# Patient Record
Sex: Female | Born: 1965 | Race: Black or African American | Hispanic: No | Marital: Single | Smoking: Never smoker
Health system: Southern US, Community
[De-identification: ages and names within clinical notes are randomized; demographics above are authoritative.]

## PROBLEM LIST (undated history)

## (undated) ENCOUNTER — Encounter

## (undated) ENCOUNTER — Encounter: Attending: Family | Primary: Family

## (undated) ENCOUNTER — Telehealth: Attending: Surgery | Primary: Surgery

## (undated) ENCOUNTER — Encounter: Attending: Hematology & Oncology | Primary: Hematology & Oncology

## (undated) ENCOUNTER — Encounter: Attending: Radiation Oncology | Primary: Radiation Oncology

## (undated) ENCOUNTER — Ambulatory Visit: Payer: PRIVATE HEALTH INSURANCE

## (undated) ENCOUNTER — Ambulatory Visit: Payer: PRIVATE HEALTH INSURANCE | Attending: Surgery | Primary: Surgery

## (undated) ENCOUNTER — Telehealth

## (undated) ENCOUNTER — Ambulatory Visit: Payer: PRIVATE HEALTH INSURANCE | Attending: Hematology & Oncology | Primary: Hematology & Oncology

## (undated) ENCOUNTER — Ambulatory Visit

## (undated) ENCOUNTER — Telehealth: Attending: Hematology & Oncology | Primary: Hematology & Oncology

## (undated) ENCOUNTER — Ambulatory Visit: Attending: Radiation Oncology | Primary: Radiation Oncology

## (undated) ENCOUNTER — Encounter: Attending: Surgery | Primary: Surgery

## (undated) ENCOUNTER — Ambulatory Visit: Attending: Family Medicine | Primary: Family Medicine

## (undated) ENCOUNTER — Encounter: Attending: Pharmacist | Primary: Pharmacist

## (undated) ENCOUNTER — Encounter: Attending: Adult Health | Primary: Adult Health

## (undated) ENCOUNTER — Ambulatory Visit: Payer: PRIVATE HEALTH INSURANCE | Attending: Family | Primary: Family

## (undated) ENCOUNTER — Encounter: Attending: Internal Medicine | Primary: Internal Medicine

## (undated) ENCOUNTER — Telehealth: Attending: Pharmacist | Primary: Pharmacist

## (undated) ENCOUNTER — Ambulatory Visit
Payer: PRIVATE HEALTH INSURANCE | Attending: Rehabilitative and Restorative Service Providers" | Primary: Rehabilitative and Restorative Service Providers"

## (undated) ENCOUNTER — Ambulatory Visit: Payer: PRIVATE HEALTH INSURANCE | Attending: Adult Health | Primary: Adult Health

## (undated) ENCOUNTER — Encounter: Attending: Nurse Practitioner | Primary: Nurse Practitioner

## (undated) ENCOUNTER — Ambulatory Visit: Attending: Nurse Practitioner | Primary: Nurse Practitioner

## (undated) ENCOUNTER — Ambulatory Visit
Attending: Student in an Organized Health Care Education/Training Program | Primary: Student in an Organized Health Care Education/Training Program

## (undated) ENCOUNTER — Encounter
Attending: Rehabilitative and Restorative Service Providers" | Primary: Rehabilitative and Restorative Service Providers"

## (undated) ENCOUNTER — Telehealth
Attending: Student in an Organized Health Care Education/Training Program | Primary: Student in an Organized Health Care Education/Training Program

## (undated) ENCOUNTER — Telehealth
Attending: Pharmacist Clinician (PhC)/ Clinical Pharmacy Specialist | Primary: Pharmacist Clinician (PhC)/ Clinical Pharmacy Specialist

## (undated) DIAGNOSIS — N750 Cyst of Bartholin's gland: Secondary | ICD-10-CM

## (undated) DIAGNOSIS — I10 Essential (primary) hypertension: Secondary | ICD-10-CM

## (undated) DIAGNOSIS — E8770 Fluid overload, unspecified: Secondary | ICD-10-CM

## (undated) DIAGNOSIS — N920 Excessive and frequent menstruation with regular cycle: Secondary | ICD-10-CM

## (undated) DIAGNOSIS — J45901 Unspecified asthma with (acute) exacerbation: Secondary | ICD-10-CM

## (undated) HISTORY — PX: MASTECTOMY: SHX3

## (undated) HISTORY — PX: WISDOM TOOTH EXTRACTION: SHX21

## (undated) HISTORY — DX: Excessive and frequent menstruation with regular cycle: N92.0

## (undated) HISTORY — PX: ABDOMINAL HYSTERECTOMY: SHX81

## (undated) HISTORY — PX: REDUCTION MAMMAPLASTY: SUR839

## (undated) HISTORY — DX: Essential (primary) hypertension: I10

## (undated) MED ORDER — GABAPENTIN 300 MG CAPSULE: Freq: Three times a day (TID) | ORAL | 0 days

## (undated) MED ORDER — DOCUSATE SODIUM 100 MG CAPSULE: Freq: Every day | ORAL | 0 days

## (undated) MED ORDER — POLYETHYLENE GLYCOL 3350 17 GRAM ORAL POWDER PACKET: Freq: Every day | ORAL | 0.00000 days

---

## 1998-02-03 ENCOUNTER — Encounter: Admission: RE | Admit: 1998-02-03 | Discharge: 1998-02-03 | Payer: Self-pay | Admitting: Family Medicine

## 1998-03-16 ENCOUNTER — Encounter: Admission: RE | Admit: 1998-03-16 | Discharge: 1998-03-16 | Payer: Self-pay | Admitting: Family Medicine

## 1998-04-26 ENCOUNTER — Encounter: Admission: RE | Admit: 1998-04-26 | Discharge: 1998-04-26 | Payer: Self-pay | Admitting: Family Medicine

## 1999-04-11 ENCOUNTER — Encounter: Admission: RE | Admit: 1999-04-11 | Discharge: 1999-04-11 | Payer: Self-pay | Admitting: Sports Medicine

## 1999-06-23 ENCOUNTER — Encounter: Admission: RE | Admit: 1999-06-23 | Discharge: 1999-06-23 | Payer: Self-pay | Admitting: Family Medicine

## 1999-06-30 ENCOUNTER — Encounter: Admission: RE | Admit: 1999-06-30 | Discharge: 1999-06-30 | Payer: Self-pay | Admitting: Family Medicine

## 1999-07-07 ENCOUNTER — Encounter: Admission: RE | Admit: 1999-07-07 | Discharge: 1999-07-07 | Payer: Self-pay | Admitting: Family Medicine

## 1999-09-27 ENCOUNTER — Ambulatory Visit (HOSPITAL_COMMUNITY): Admission: RE | Admit: 1999-09-27 | Discharge: 1999-09-27 | Payer: Self-pay | Admitting: Family Medicine

## 1999-10-10 ENCOUNTER — Emergency Department (HOSPITAL_COMMUNITY): Admission: EM | Admit: 1999-10-10 | Discharge: 1999-10-10 | Payer: Self-pay

## 1999-11-02 ENCOUNTER — Encounter: Admission: RE | Admit: 1999-11-02 | Discharge: 1999-11-02 | Payer: Self-pay | Admitting: Family Medicine

## 2000-01-04 ENCOUNTER — Encounter: Admission: RE | Admit: 2000-01-04 | Discharge: 2000-01-04 | Payer: Self-pay | Admitting: Family Medicine

## 2000-07-31 ENCOUNTER — Encounter: Admission: RE | Admit: 2000-07-31 | Discharge: 2000-07-31 | Payer: Self-pay | Admitting: Family Medicine

## 2000-08-07 ENCOUNTER — Encounter: Admission: RE | Admit: 2000-08-07 | Discharge: 2000-08-07 | Payer: Self-pay | Admitting: Sports Medicine

## 2002-02-03 ENCOUNTER — Encounter (INDEPENDENT_AMBULATORY_CARE_PROVIDER_SITE_OTHER): Payer: Self-pay | Admitting: *Deleted

## 2002-03-02 ENCOUNTER — Other Ambulatory Visit: Admission: RE | Admit: 2002-03-02 | Discharge: 2002-03-02 | Payer: Self-pay | Admitting: Family Medicine

## 2002-03-02 ENCOUNTER — Encounter: Admission: RE | Admit: 2002-03-02 | Discharge: 2002-03-02 | Payer: Self-pay | Admitting: Family Medicine

## 2003-03-30 ENCOUNTER — Ambulatory Visit (HOSPITAL_COMMUNITY): Admission: RE | Admit: 2003-03-30 | Discharge: 2003-03-30 | Payer: Self-pay | Admitting: Plastic Surgery

## 2003-03-30 ENCOUNTER — Encounter: Payer: Self-pay | Admitting: Plastic Surgery

## 2003-04-06 HISTORY — PX: BREAST REDUCTION SURGERY: SHX8

## 2003-09-05 ENCOUNTER — Emergency Department (HOSPITAL_COMMUNITY): Admission: EM | Admit: 2003-09-05 | Discharge: 2003-09-05 | Payer: Self-pay | Admitting: Emergency Medicine

## 2006-02-25 ENCOUNTER — Ambulatory Visit: Payer: Self-pay | Admitting: Cardiovascular Disease

## 2006-03-21 ENCOUNTER — Encounter: Payer: Self-pay | Admitting: Cardiology

## 2006-03-21 ENCOUNTER — Ambulatory Visit: Payer: Self-pay

## 2007-01-03 ENCOUNTER — Encounter (INDEPENDENT_AMBULATORY_CARE_PROVIDER_SITE_OTHER): Payer: Self-pay | Admitting: *Deleted

## 2009-09-15 ENCOUNTER — Observation Stay (HOSPITAL_COMMUNITY): Admission: EM | Admit: 2009-09-15 | Discharge: 2009-09-16 | Payer: Self-pay | Admitting: Emergency Medicine

## 2009-09-15 ENCOUNTER — Ambulatory Visit: Payer: Self-pay | Admitting: Cardiology

## 2011-02-07 LAB — POCT CARDIAC MARKERS
CKMB, poc: <1 ng/mL — ABNORMAL LOW (ref 1.0–8.0)
Myoglobin, poc: 63.1 ng/mL (ref 12–200)
Myoglobin, poc: 78.5 ng/mL (ref 12–200)
Troponin i, poc: <0.05 ng/mL (ref 0.00–0.09)

## 2011-02-07 LAB — POCT I-STAT, CHEM 8
BUN: 11 mg/dL (ref 6–23)
Calcium, Ion: 1.23 mmol/L (ref 1.12–1.32)
Glucose, Bld: 89 mg/dL (ref 70–99)
HCT: 32 % — ABNORMAL LOW (ref 36.0–46.0)
TCO2: 26 mmol/L (ref 0–100)

## 2011-02-07 LAB — DIFFERENTIAL
Basophils Absolute: 0 10*3/uL (ref 0.0–0.1)
Basophils Relative: 0 % (ref 0–1)
Eosinophils Relative: 1 % (ref 0–5)
Monocytes Absolute: 0.4 10*3/uL (ref 0.1–1.0)

## 2011-02-07 LAB — CARDIAC PANEL(CRET KIN+CKTOT+MB+TROPI)
CK, MB: 0.5 ng/mL (ref 0.3–4.0)
CK, MB: 0.5 ng/mL (ref 0.3–4.0)
Total CK: 159 U/L (ref 7–177)
Total CK: 177 U/L (ref 7–177)
Troponin I: 0.01 ng/mL (ref 0.00–0.06)

## 2011-02-07 LAB — LIPID PANEL
Cholesterol: 242 mg/dL — ABNORMAL HIGH (ref 0–200)
LDL Cholesterol: 170 mg/dL — ABNORMAL HIGH (ref 0–99)
Triglycerides: 146 mg/dL (ref <150)
VLDL: 29 mg/dL (ref 0–40)

## 2011-02-07 LAB — CBC
HCT: 29.5 % — ABNORMAL LOW (ref 36.0–46.0)
Hemoglobin: 10 g/dL — ABNORMAL LOW (ref 12.0–15.0)
MCHC: 33.8 g/dL (ref 30.0–36.0)
MCHC: 33.9 g/dL (ref 30.0–36.0)
MCV: 80.6 fL (ref 78.0–100.0)
Platelets: 324 10*3/uL (ref 150–400)
RDW: 15.6 % — ABNORMAL HIGH (ref 11.5–15.5)
WBC: 4.4 10*3/uL (ref 4.0–10.5)

## 2011-02-07 LAB — URINALYSIS, ROUTINE W REFLEX MICROSCOPIC
Bilirubin Urine: NEGATIVE
Hgb urine dipstick: NEGATIVE
Ketones, ur: NEGATIVE mg/dL
Nitrite: NEGATIVE
Specific Gravity, Urine: 1.011 (ref 1.005–1.030)
Urobilinogen, UA: 1 mg/dL (ref 0.0–1.0)

## 2011-02-07 LAB — FERRITIN: Ferritin: 6 ng/mL — ABNORMAL LOW (ref 10–291)

## 2011-02-07 LAB — IRON: Iron: 29 ug/dL — ABNORMAL LOW (ref 42–135)

## 2011-02-07 LAB — TSH: TSH: 1.293 u[IU]/mL (ref 0.350–4.500)

## 2011-02-07 LAB — TROPONIN I: Troponin I: 0.02 ng/mL (ref 0.00–0.06)

## 2011-02-07 LAB — CK TOTAL AND CKMB (NOT AT ARMC): Relative Index: 0.3 (ref 0.0–2.5)

## 2011-12-02 ENCOUNTER — Ambulatory Visit: Payer: 59

## 2011-12-02 DIAGNOSIS — D509 Iron deficiency anemia, unspecified: Secondary | ICD-10-CM

## 2011-12-02 DIAGNOSIS — J111 Influenza due to unidentified influenza virus with other respiratory manifestations: Secondary | ICD-10-CM

## 2011-12-02 DIAGNOSIS — R509 Fever, unspecified: Secondary | ICD-10-CM

## 2011-12-02 DIAGNOSIS — R05 Cough: Secondary | ICD-10-CM

## 2012-02-25 ENCOUNTER — Other Ambulatory Visit: Payer: Self-pay | Admitting: Emergency Medicine

## 2012-03-13 ENCOUNTER — Other Ambulatory Visit: Payer: Self-pay | Admitting: Emergency Medicine

## 2012-03-13 NOTE — Telephone Encounter (Signed)
Pt states her pharmacy sent several requests for HCPZ and has not had a response. She has had a headache for more than one week [facility] - [place] [address]

## 2012-06-09 ENCOUNTER — Other Ambulatory Visit: Payer: Self-pay | Admitting: Physician Assistant

## 2012-06-17 HISTORY — PX: BUNIONECTOMY: SHX129

## 2012-07-28 ENCOUNTER — Ambulatory Visit: Payer: 59 | Attending: Podiatry | Admitting: Physical Therapy

## 2012-07-28 DIAGNOSIS — M25673 Stiffness of unspecified ankle, not elsewhere classified: Secondary | ICD-10-CM | POA: Insufficient documentation

## 2012-07-28 DIAGNOSIS — M25579 Pain in unspecified ankle and joints of unspecified foot: Secondary | ICD-10-CM | POA: Insufficient documentation

## 2012-07-28 DIAGNOSIS — IMO0001 Reserved for inherently not codable concepts without codable children: Secondary | ICD-10-CM | POA: Insufficient documentation

## 2012-07-28 DIAGNOSIS — M25676 Stiffness of unspecified foot, not elsewhere classified: Secondary | ICD-10-CM | POA: Insufficient documentation

## 2012-07-31 ENCOUNTER — Ambulatory Visit: Payer: 59 | Admitting: Physical Therapy

## 2012-08-05 ENCOUNTER — Ambulatory Visit: Payer: 59 | Attending: Podiatry | Admitting: Physical Therapy

## 2012-08-05 DIAGNOSIS — IMO0001 Reserved for inherently not codable concepts without codable children: Secondary | ICD-10-CM | POA: Insufficient documentation

## 2012-08-05 DIAGNOSIS — M25676 Stiffness of unspecified foot, not elsewhere classified: Secondary | ICD-10-CM | POA: Insufficient documentation

## 2012-08-05 DIAGNOSIS — M25579 Pain in unspecified ankle and joints of unspecified foot: Secondary | ICD-10-CM | POA: Insufficient documentation

## 2012-08-05 DIAGNOSIS — M25673 Stiffness of unspecified ankle, not elsewhere classified: Secondary | ICD-10-CM | POA: Insufficient documentation

## 2012-08-07 ENCOUNTER — Ambulatory Visit: Payer: 59 | Admitting: Physical Therapy

## 2012-08-12 ENCOUNTER — Ambulatory Visit: Payer: 59 | Admitting: Physical Therapy

## 2012-08-14 ENCOUNTER — Ambulatory Visit: Payer: 59 | Admitting: Physical Therapy

## 2012-08-22 ENCOUNTER — Emergency Department (HOSPITAL_BASED_OUTPATIENT_CLINIC_OR_DEPARTMENT_OTHER): Payer: 59

## 2012-08-22 ENCOUNTER — Encounter (HOSPITAL_BASED_OUTPATIENT_CLINIC_OR_DEPARTMENT_OTHER): Payer: Self-pay

## 2012-08-22 ENCOUNTER — Emergency Department (HOSPITAL_BASED_OUTPATIENT_CLINIC_OR_DEPARTMENT_OTHER)
Admission: EM | Admit: 2012-08-22 | Discharge: 2012-08-22 | Disposition: A | Discharge status: Home / Self Care | Payer: 59 | Attending: Emergency Medicine | Admitting: Emergency Medicine

## 2012-08-22 ENCOUNTER — Encounter (HOSPITAL_BASED_OUTPATIENT_CLINIC_OR_DEPARTMENT_OTHER): Payer: Self-pay | Admitting: *Deleted

## 2012-08-22 DIAGNOSIS — N840 Polyp of corpus uteri: Secondary | ICD-10-CM | POA: Insufficient documentation

## 2012-08-22 DIAGNOSIS — D649 Anemia, unspecified: Secondary | ICD-10-CM

## 2012-08-22 DIAGNOSIS — N939 Abnormal uterine and vaginal bleeding, unspecified: Secondary | ICD-10-CM

## 2012-08-22 DIAGNOSIS — N920 Excessive and frequent menstruation with regular cycle: Secondary | ICD-10-CM | POA: Insufficient documentation

## 2012-08-22 DIAGNOSIS — N921 Excessive and frequent menstruation with irregular cycle: Secondary | ICD-10-CM

## 2012-08-22 DIAGNOSIS — I10 Essential (primary) hypertension: Secondary | ICD-10-CM | POA: Insufficient documentation

## 2012-08-22 DIAGNOSIS — N898 Other specified noninflammatory disorders of vagina: Secondary | ICD-10-CM | POA: Insufficient documentation

## 2012-08-22 LAB — URINALYSIS, ROUTINE W REFLEX MICROSCOPIC
Bilirubin Urine: NEGATIVE
Ketones, ur: NEGATIVE mg/dL
Leukocytes, UA: NEGATIVE
Nitrite: NEGATIVE
Urobilinogen, UA: 0.2 mg/dL (ref 0.0–1.0)

## 2012-08-22 LAB — WET PREP, GENITAL
Clue Cells Wet Prep HPF POC: NONE SEEN
Trich, Wet Prep: NONE SEEN

## 2012-08-22 LAB — CBC
HCT: 29.6 % — ABNORMAL LOW (ref 36.0–46.0)
Hemoglobin: 9.3 g/dL — ABNORMAL LOW (ref 12.0–15.0)
MCV: 77.7 fL — ABNORMAL LOW (ref 78.0–100.0)
RBC: 3.81 MIL/uL — ABNORMAL LOW (ref 3.87–5.11)
WBC: 6 10*3/uL (ref 4.0–10.5)

## 2012-08-22 LAB — PREGNANCY, URINE: Preg Test, Ur: NEGATIVE

## 2012-08-22 MED ORDER — POLYETHYLENE GLYCOL 3350 17 GM/SCOOP PO POWD: 17.0000 g | Freq: Every day | ORAL | Status: DC

## 2012-08-22 MED ORDER — FERROUS SULFATE 325 (65 FE) MG PO TABS: 325.0000 mg | ORAL_TABLET | Freq: Three times a day (TID) | ORAL | Status: DC

## 2012-08-22 NOTE — ED Notes (Signed)
Pt here to received lab and diagnostic results from previous ED visit today.

## 2012-08-22 NOTE — ED Provider Notes (Signed)
History     CSN: [id number]  Arrival date & time 08/22/12  1040   First MD Initiated Contact with Patient 08/22/12 1055      Chief Complaint  Patient presents with  . Vaginal Bleeding    (Consider location/radiation/quality/duration/timing/severity/associated sxs/prior treatment) HPI Lori Green is a 46 y.o. female presenting to the ER for constant menses since 07/21/2012. This is never happened to her before. She has occasional cramping which she rates as a 7/10, located in the pelvis, nonradiating, not associated with fevers, chills or vaginal discharge. She's also complained of some intermittent dizziness and lightheadedness on positional changes, and occasional headaches. She denies any shortness of breath or syncopal episodes. She says she's had some swelling in the right ankle but no pain in the calf, no history of blood clots and no hemoptysis. Patient has been taking an over-the-counter iron pill for fatigue because she thinks she might be anemic. She does have a history of high blood pressure and has been taking hydrochlorothiazide intermittently. She doesn't think she could be pregnant. History reviewed. No pertinent past medical history.  History reviewed. No pertinent past surgical history.  History reviewed. No pertinent family history.  History  Substance Use Topics  . Smoking status: Never Smoker   . Smokeless tobacco: Not on file  . Alcohol Use: No    OB History    Grav Para Term Preterm Abortions TAB SAB Ect Mult Living                  Review of Systems At least 10pt or greater review of systems completed and are negative except where specified in the HPI.  Allergies  Review of patient's allergies indicates no known allergies.  Home Medications   Current Outpatient Rx  Name Route Sig Dispense Refill  . HYDROCHLOROTHIAZIDE 25 MG PO TABS  TAKE 1 TABLET BY MOUTH EVERY DAY 30 tablet 0    Needs follow up for blood pressure    BP 137/77  Pulse 102   Temp 98.8 F (37.1 C) (Oral)  Resp 16  Ht 5' 4.5" (1.638 m)  Wt 192 lb (87.091 kg)  BMI 32.45 kg/m2  SpO2 98%  LMP 07/21/2012  Physical Exam  Nursing notes reviewed.  Electronic medical record reviewed. VITAL SIGNS:   Filed Vitals:   08/22/12 1048  BP: 137/77  Pulse: 102  Temp: 98.8 F (37.1 C)  TempSrc: Oral  Resp: 16  Height: 5' 4.5" (1.638 m)  Weight: 192 lb (87.091 kg)  SpO2: 98%   CONSTITUTIONAL: Awake, oriented, appears non-toxic HENT: Atraumatic, normocephalic, oral mucosa pink and moist, airway patent. Nares patent without drainage. External ears normal. EYES: Conjunctiva clear, EOMI, PERRLA NECK: Trachea midline, non-tender, supple CARDIOVASCULAR: Normal heart rate, Normal rhythm, No murmurs, rubs, gallops PULMONARY/CHEST: Clear to auscultation, no rhonchi, wheezes, or rales. Symmetrical breath sounds. Non-tender. ABDOMINAL: Non-distended, morbidly obese, soft, non-tender - no rebound or guarding.  BS normal. NEUROLOGIC: Non-focal, moving all four extremities, no gross sensory or motor deficits. EXTREMITIES: No clubbing, cyanosis, or edema SKIN: Warm, Dry, No erythema, No rash PELVIC EXAM: normal external genitalia, vulva, vagina-small amount of dark red blood in the vault, multiparous cervix, normal uterus and adnexa - no CMT or adnexal tenderness. ED Course  Procedures (including critical care time)   Labs Reviewed  PREGNANCY, URINE  URINALYSIS, ROUTINE W REFLEX MICROSCOPIC   US Transvaginal Non-ob  08/22/2012  *RADIOLOGY REPORT*  Clinical Data: Sporadic heavy vaginal bleeding.  LMP 06/19/2012  TRANSABDOMINAL AND  TRANSVAGINAL ULTRASOUND OF PELVIS Technique:  Both transabdominal and transvaginal ultrasound examinations of the pelvis were performed. Transabdominal technique was performed for global imaging of the pelvis including uterus, ovaries, adnexal regions, and pelvic cul-de-sac.  It was necessary to proceed with endovaginal exam following the  transabdominal exam to visualize the myometrium, endometrium and adnexa.  Comparison:  None  Findings:  Uterus: Is anteverted and anteflexed and demonstrates a sagittal length of 10.9 cm, depth of 5.4 cm and width of 5.9 cm.  A homogeneous myometrium is noted.  Several Nabothian cysts are seen in the cervix.  Endometrium: Demonstrates an overall width of 13 mm.  An area of focal heterogeneity is noted in the fundal region measuring 2.1 by 0.9 mm and well imaged only in the sagittal plane. A small amount of fluid surrounds this area and the appearance is suspicious for a focal polyp. No significant flow is identified within this area and no feeding has a is seen with color Doppler assessment to confirm this impression.  Right ovary:  Measures 1.8 x 1.7 x 1.2 cm and has a normal appearance  Left ovary: Measures 2.3 x 2.3 x 1.6 cm and contains a follicle  Other findings: No pelvic fluid or separate adnexal masses are seen.  IMPRESSION: Area of focal soft tissue heterogeneity within the fundal portion of the endometrial canal, suspicious sonographically for a polyp. An area of focal hyperplasia or acute intraluminal clot in a patient actively bleeding could have a similar appearance. Endometrial carcinoma can have this appearance but is felt less likely given the lack of flow with color Doppler assessment.  Normal ovaries.   Original Report Authenticated By: Bertha Stakes, M.D.    US Pelvis Complete  08/22/2012  *RADIOLOGY REPORT*  Clinical Data: Sporadic heavy vaginal bleeding.  LMP 06/19/2012  TRANSABDOMINAL AND TRANSVAGINAL ULTRASOUND OF PELVIS Technique:  Both transabdominal and transvaginal ultrasound examinations of the pelvis were performed. Transabdominal technique was performed for global imaging of the pelvis including uterus, ovaries, adnexal regions, and pelvic cul-de-sac.  It was necessary to proceed with endovaginal exam following the transabdominal exam to visualize the myometrium, endometrium  and adnexa.  Comparison:  None  Findings:  Uterus: Is anteverted and anteflexed and demonstrates a sagittal length of 10.9 cm, depth of 5.4 cm and width of 5.9 cm.  A homogeneous myometrium is noted.  Several Nabothian cysts are seen in the cervix.  Endometrium: Demonstrates an overall width of 13 mm.  An area of focal heterogeneity is noted in the fundal region measuring 2.1 by 0.9 mm and well imaged only in the sagittal plane. A small amount of fluid surrounds this area and the appearance is suspicious for a focal polyp. No significant flow is identified within this area and no feeding has a is seen with color Doppler assessment to confirm this impression.  Right ovary:  Measures 1.8 x 1.7 x 1.2 cm and has a normal appearance  Left ovary: Measures 2.3 x 2.3 x 1.6 cm and contains a follicle  Other findings: No pelvic fluid or separate adnexal masses are seen.  IMPRESSION: Area of focal soft tissue heterogeneity within the fundal portion of the endometrial canal, suspicious sonographically for a polyp. An area of focal hyperplasia or acute intraluminal clot in a patient actively bleeding could have a similar appearance. Endometrial carcinoma can have this appearance but is felt less likely given the lack of flow with color Doppler assessment.  Normal ovaries.   Original Report Authenticated By: Octaviano Batty.  Kyung Rudd, M.D.      No diagnosis found. Patient left this encounter AGAINST MEDICAL ADVICE.   MDM  Harriett P Rahrig is a 46 y.o. female reasons to the ER with menometrorrhagia September. She has no history of uterine abnormalities, cysts or fibroids.  No history of fevers or chills. Check an H&H, urinalysis, wet prep.  Pelvic exam does show some blood in the vault without tenderness. Also obtain a ultrasound of the pelvis looking for uterine abnormalities.  Patient requesting to leave AGAINST MEDICAL ADVICE because she has to go pick her daughter up for school-she will return later for results of her  tests.  08/22/2012 3:35 PM Patient did return to the ER and to get her results and medical plan. Medical plan is to treat her mild anemia with hemoglobin of 9.3 and [facility] her possible uterine polyp is seen on ultrasound. Patient can take ibuprofen as needed for cramping. She is to return to the ER should she have any syncopal episodes, shortness of breath, chest pain, worsening bleeding, bright red bleeding or any other concerning symptoms. She understands accepts the medical plan as it's been dictated and agrees with it. She'll prophylactically put on GlycoLax to counter the effects of her iron supplementation-she'll be given iron sulfate 325 mg 3 times a day with meals.           Jones Skene, MD 08/22/12 1536

## 2012-08-22 NOTE — ED Notes (Signed)
Vaginal bleeding since sept 16 some days will completely stop then starts again heavy.

## 2012-08-22 NOTE — ED Provider Notes (Signed)
Lori Green is a 46 y.o. female returns to the emergency department to complete her evaluation. Patient left AMA about an hour or 2 ago to pick up her daughter. She presented originally with menometrorrhagia since September 16. She's had occasional vaginal cramping however this is self-limited and not present currently. She's not had any syncopal episodes, shortness of breath, chest pain. Nothing is changed she was last seen in emergency department on hour ago. Abdomen is soft and nontender, she appears well, no apparent distress.  08/22/2012 3:35 PM Patient did return to the ER and to get her results and medical plan. Medical plan is to treat her mild anemia with hemoglobin of 9.3 and [facility] her possible uterine polyp is seen on ultrasound. Patient can take ibuprofen as needed for cramping. She is to return to the ER should she have any syncopal episodes, shortness of breath, chest pain, worsening bleeding, bright red bleeding or any other concerning symptoms. She understands accepts the medical plan as it's been dictated and agrees with it. She'll prophylactically put on GlycoLax to counter the effects of her iron supplementation-she'll be given iron sulfate 325 mg 3 times a day with meals.    Jones Skene, MD 08/22/12 1538

## 2012-08-22 NOTE — ED Notes (Signed)
Pt left ED earlier AMA related to family issue.  She has not complaints at present time and is back to review lab and diagnostic results with EDP.  Dr. Rulon Abide informed.

## 2012-08-22 NOTE — ED Notes (Signed)
Pt called me into room stating that she just received a call that the person who was going to pick her child up was no longer able to do so and she must leave.she states she will "come right back" the tests have  Been performed but MD not ready to discharge her explained to patient that we needed her to sign an AMA paper stating she is aware that her condition could worsen patient states she understands this and signed the paper stating she will return and verbalizing understanding that she will have to sign back in upon her return

## 2012-08-23 LAB — GC/CHLAMYDIA PROBE AMP, GENITAL
Chlamydia, DNA Probe: NEGATIVE
GC Probe Amp, Genital: NEGATIVE

## 2012-08-28 ENCOUNTER — Ambulatory Visit: Payer: 59 | Admitting: Physical Therapy

## 2012-09-03 ENCOUNTER — Ambulatory Visit: Payer: 59 | Admitting: Physical Therapy

## 2012-09-09 ENCOUNTER — Ambulatory Visit: Payer: 59 | Admitting: Physical Therapy

## 2012-09-11 ENCOUNTER — Ambulatory Visit: Payer: 59 | Admitting: Physical Therapy

## 2012-09-12 ENCOUNTER — Ambulatory Visit (INDEPENDENT_AMBULATORY_CARE_PROVIDER_SITE_OTHER): Payer: 59 | Admitting: Advanced Practice Midwife

## 2012-09-12 ENCOUNTER — Other Ambulatory Visit (HOSPITAL_COMMUNITY)
Admission: RE | Admit: 2012-09-12 | Discharge: 2012-09-12 | Disposition: A | Discharge status: Home / Self Care | Payer: 59 | Source: Ambulatory Visit | Attending: Advanced Practice Midwife | Admitting: Advanced Practice Midwife

## 2012-09-12 ENCOUNTER — Encounter: Payer: Self-pay | Admitting: Advanced Practice Midwife

## 2012-09-12 VITALS — BP 128/78 | HR 80 | Temp 98.6°F | Ht 64.0 in | Wt 217.0 lb

## 2012-09-12 DIAGNOSIS — Z01419 Encounter for gynecological examination (general) (routine) without abnormal findings: Secondary | ICD-10-CM

## 2012-09-12 DIAGNOSIS — N949 Unspecified condition associated with female genital organs and menstrual cycle: Secondary | ICD-10-CM

## 2012-09-12 DIAGNOSIS — N938 Other specified abnormal uterine and vaginal bleeding: Secondary | ICD-10-CM | POA: Insufficient documentation

## 2012-09-12 DIAGNOSIS — Z01812 Encounter for preprocedural laboratory examination: Secondary | ICD-10-CM

## 2012-09-12 LAB — POCT PREGNANCY, URINE: Preg Test, Ur: NEGATIVE

## 2012-09-12 NOTE — Progress Notes (Signed)
Here today because she started bleeding 07/21/12 and was still bleeding for over a month, went to ER on 08/22/12 because still bleeding. They did some tests and sent her to [place]. The bleeding finally stopped about 09/01/12 and has not had any bleeding since then.

## 2012-09-12 NOTE — Progress Notes (Signed)
  Subjective:     Lori Green is an 46 y.o. woman who presents for irregular menses. She had been bleeding regularly. She had a cycle that started in September and persisted for a month. Bleeding was heavy. She went to [facility] and [place] was done which showed possible fundal endometrial polyp.  This is the first time this has happened.   Dysmenorrhea:mild, occurring throughout menses. Cyclic symptoms include: none. Current contraception: none. History of infertility: no. History of abnormal Pap smear: no.  She also has not had a pap smear since 2003.    Menstrual History: OB History    Grav Para Term Preterm Abortions TAB SAB Ect Mult Living   6 6 6       6       Menarche age:  Patient's last menstrual period was 07/21/2012.    The following portions of the patient's history were reviewed and updated as appropriate: allergies, current medications, past family history, past medical history, past social history, past surgical history and problem list.  Review of Systems Pertinent items are noted in HPI.    Objective:    BP 128/78  Pulse 80  Temp 98.6 F (37 C)  Ht 5\' 4"  (1.626 m)  Wt 217 lb (98.431 kg)  BMI 37.25 kg/m2  LMP 07/21/2012  General:   alert and no distress  Skin:    normal and no rash or abnormalities  Neck:  no adenopathy, no carotid bruit, no JVD, supple, symmetrical, trachea midline and thyroid not enlarged, symmetric, no tenderness/mass/nodules  Abdomen:  soft, non-tender; bowel sounds normal; no masses,  no organomegaly  Pelvic:   cervix normal in appearance, external genitalia normal, no adnexal masses or tenderness, no cervical motion tenderness, rectovaginal septum normal, uterus normal size, shape, and consistency and vagina normal without discharge     Assessment:    dysfunctional uterine bleeding    Plan:    All questions answered. Await pap smear results. Agricultural engineer distributed. Endometrial biopsy - see separate procedure  note. Follow up in 2 weeks. Pregnancy test, result: negative. Thin prep Pap smear.   Discussed possible source of bleeding could be cancer, though unlikely. Probable endometrial polyp, may need D&C.  Will bring back for results visit with MD to discuss.  Informed her if she resumes heavy bleeding to call us and we can prescribe some Megace.

## 2012-09-12 NOTE — Progress Notes (Signed)
  Endometrial Biopsy Procedure Note  Pre-operative Diagnosis: Dysfunctional Uterine Bleeding   Post-operative Diagnosis: same  Indications: abnormal uterine bleeding  Procedure Details   Urine pregnancy test was done  and result was Negative.  The risks (including infection, bleeding, pain, and uterine perforation) and benefits of the procedure were explained to the patient and Written informed consent was obtained.  Antibiotic prophylaxis against endocarditis was not indicated.   The patient was placed in the dorsal lithotomy position.  Bimanual exam showed the uterus to be in the anteroflexed position.  A Graves' speculum inserted in the vagina, and the cervix prepped with povidone iodine.  Endocervical curettage with a Kevorkian curette was performed.   A sharp tenaculum was applied to the anterior lip of the cervix for stabilization.  A sterile uterine sound was used to sound the uterus to a depth of 9.5cm.  A Pipelle endometrial aspirator was used to sample the endometrium.  Sample was sent for pathologic examination.  Condition: Stable  Complications: None  Plan:  The patient was advised to call for any fever or for prolonged or severe pain or bleeding. She was advised to use NSAID as needed for mild to moderate pain. She was advised to avoid vaginal intercourse for 48 hours or until the bleeding has completely stopped.  Attending Physician Documentation: I was present for or participated in the entire procedure, including opening and closing.

## 2012-09-12 NOTE — Patient Instructions (Signed)
Dysfunctional Uterine Bleeding Normally, menstrual periods begin between ages 11 to 17 in young women. A normal menstrual cycle/period may begin every 23 days up to 35 days and lasts from 1 to 7 days. Around 12 to 14 days before your menstrual period starts, ovulation (ovary produces an egg) occurs. When counting the time between menstrual periods, count from the first day of bleeding of the previous period to the first day of bleeding of the next period. Dysfunctional (abnormal) uterine bleeding is bleeding that is different from a normal menstrual period. Your periods may come earlier or later than usual. They may be lighter, have blood clots or be heavier. You may have bleeding between periods, or you may skip one period or more. You may have bleeding after sexual intercourse, bleeding after menopause, or no menstrual period. CAUSES   Pregnancy (normal, miscarriage, tubal).  IUDs (intrauterine device, birth control).  Birth control pills.  Hormone treatment.  Menopause.  Infection of the cervix.  Blood clotting problems.  Infection of the inside lining of the uterus.  Endometriosis, inside lining of the uterus growing in the pelvis and other female organs.  Adhesions (scar tissue) inside the uterus.  Obesity or severe weight loss.  Uterine polyps inside the uterus.  Cancer of the vagina, cervix, or uterus.  Ovarian cysts or polycystic ovary syndrome.  Medical problems (diabetes, thyroid disease).  Uterine fibroids (noncancerous tumor).  Problems with your female hormones.  Endometrial hyperplasia, very thick lining and enlarged cells inside of the uterus.  Medicines that interfere with ovulation.  Radiation to the pelvis or abdomen.  Chemotherapy. DIAGNOSIS   Your doctor will discuss the history of your menstrual periods, medicines you are taking, changes in your weight, stress in your life, and any medical problems you may have.  Your doctor will do a physical  and pelvic examination.  Your doctor may want to perform certain tests to make a diagnosis, such as:  Pap test.  Blood tests.  Cultures for infection.  CT scan.  Ultrasound.  Hysteroscopy.  Laparoscopy.  MRI.  Hysterosalpingography.  D and C.  Endometrial biopsy. TREATMENT  Treatment will depend on the cause of the dysfunctional uterine bleeding (DUB). Treatment may include:  Observing your menstrual periods for a couple of months.  Prescribing medicines for medical problems, including:  Antibiotics.  Hormones.  Birth control pills.  Removing an IUD (intrauterine device, birth control).  Surgery:  D and C (scrape and remove tissue from inside the uterus).  Laparoscopy (examine inside the abdomen with a lighted tube).  Uterine ablation (destroy lining of the uterus with electrical current, laser, heat, or freezing).  Hysteroscopy (examine cervix and uterus with a lighted tube).  Hysterectomy (remove the uterus). HOME CARE INSTRUCTIONS   If medicines were prescribed, take exactly as directed. Do not change or switch medicines without consulting your caregiver.  Long term heavy bleeding may result in iron deficiency. Your caregiver may have prescribed iron pills. They help replace the iron that your body lost from heavy bleeding. Take exactly as directed.  Do not take aspirin or medicines that contain aspirin one week before or during your menstrual period. Aspirin may make the bleeding worse.  If you need to change your sanitary pad or tampon more than once every 2 hours, stay in bed with your feet elevated and a cold pack on your lower abdomen. Rest as much as possible, until the bleeding stops or slows down.  Eat well-balanced meals. Eat foods high in iron. Examples   are:  Leafy green vegetables.  Whole-grain breads and cereals.  Eggs.  Meat.  Liver.  Do not try to lose weight until the abnormal bleeding has stopped and your blood iron level is  back to normal. Do not lift more than ten pounds or do strenuous activities when you are bleeding.  For a couple of months, make note on your calendar, marking the start and ending of your period, and the type of bleeding (light, medium, heavy, spotting, clots or missed periods). This is for your caregiver to better evaluate your problem. SEEK MEDICAL CARE IF:   You develop nausea (feeling sick to your stomach) and vomiting, dizziness, or diarrhea while you are taking your medicine.  You are getting lightheaded or weak.  You have any problems that may be related to the medicine you are taking.  You develop pain with your DUB.  You want to remove your IUD.  You want to stop or change your birth control pills or hormones.  You have any type of abnormal bleeding mentioned above.  You are over 16 years old and have not had a menstrual period yet.  You are 46 years old and you are still having menstrual periods.  You have any of the symptoms mentioned above.  You develop a rash. SEEK IMMEDIATE MEDICAL CARE IF:   An oral temperature above 102 F (38.9 C) develops.  You develop chills.  You are changing your sanitary pad or tampon more than once an hour.  You develop abdominal pain.  You pass out or faint. Document Released: 10/19/2000 Document Revised: 01/14/2012 Document Reviewed: 09/20/2009 Exit[employer] Patient Information 2013 [employer]. Endometrial Biopsy This is a test in which a tissue sample (a biopsy) is taken from inside the uterus (womb). It is then looked at by a specialist under a microscope to see if the tissue is normal or abnormal. The endometrium is the lining of the uterus. This test helps determine where you are in your menstrual cycle and how hormone levels are affecting the lining of the uterus. Another use for this test is to diagnose endometrial cancer, tuberculosis, polyps, or inflammatory conditions and to evaluate uterine bleeding. PREPARATION FOR  TEST No preparation or fasting is necessary. NORMAL FINDINGS No pathologic conditions. Presence of "secretory-type" endometrium 3 to 5 days before to normal menstruation. Ranges for normal findings may vary among different laboratories and hospitals. You should always check with your doctor after having lab work or other tests done to discuss the meaning of your test results and whether your values are considered within normal limits. MEANING OF TEST  Your caregiver will go over the test results with you and discuss the importance and meaning of your results, as well as treatment options and the need for additional tests if necessary. OBTAINING THE TEST RESULTS It is your responsibility to obtain your test results. Ask the lab or department performing the test when and how you will get your results. Document Released: 02/22/2005 Document Revised: 01/14/2012 Document Reviewed: 10/01/2008 [employer] Patient Information 2013 [employer].  

## 2012-09-17 ENCOUNTER — Telehealth: Payer: Self-pay | Admitting: General Practice

## 2012-09-17 NOTE — Telephone Encounter (Signed)
Patient called and stated she was seen on 11/8 by a midwife and had an endometrial biopsy done and was told to call us back if she started her menstrual cycle again, which she did today and was calling because she doesn't know if the provider is going to call her in something because the provider mentioned that at the visit and doesn't know what she is supposed to do and would like a call back.  Called patient back and informed her that I received her message from earlier this afternoon and asked about how much bleeding she was having. Patient stated she literally just started so it was not that much right now. I told the patient that from her last visit, the note says to give [place] a call if the bleeding is very heavy, then we would look into starting medication and that this was probably something they would discuss with her at her visit next week. Reminded patient that her appt was 11/21 at 2:45pm. Patient verbalized understanding and stated she would call back if her bleeding became heavy and had no further questions.

## 2012-09-18 ENCOUNTER — Encounter: Payer: Self-pay | Admitting: Advanced Practice Midwife

## 2012-09-23 ENCOUNTER — Ambulatory Visit (INDEPENDENT_AMBULATORY_CARE_PROVIDER_SITE_OTHER): Payer: 59 | Admitting: Emergency Medicine

## 2012-09-23 ENCOUNTER — Encounter: Payer: Self-pay | Admitting: Emergency Medicine

## 2012-09-23 VITALS — BP 130/72 | HR 85 | Temp 98.8°F | Resp 16

## 2012-09-23 DIAGNOSIS — E669 Obesity, unspecified: Secondary | ICD-10-CM

## 2012-09-23 DIAGNOSIS — Z23 Encounter for immunization: Secondary | ICD-10-CM

## 2012-09-23 DIAGNOSIS — I10 Essential (primary) hypertension: Secondary | ICD-10-CM | POA: Insufficient documentation

## 2012-09-23 MED ORDER — HYDROCHLOROTHIAZIDE 25 MG PO TABS: 25.0000 mg | ORAL_TABLET | Freq: Every day | ORAL | Status: DC

## 2012-09-23 MED ORDER — PHENTERMINE HCL 37.5 MG PO CAPS: 37.5000 mg | ORAL_CAPSULE | ORAL | Status: DC

## 2012-09-23 NOTE — Progress Notes (Signed)
  Subjective:    [name], female    DOB: Apr 05, 1966, 46 y.o.   MRN: [id number]  HPI patient is given refill on her blood pressure medication. She's been out of this medication. She also recently has had dysfunctional uterine bleeding and recently underwent an endometrial biopsy. She is scheduled to followup with the GYN physician on Thursday to review results and decide on next motor treatment. She is known to be anemic and is currently on ferrous sulfate. She has recently joined an exercise center and is requesting short term and tear mean to get started on prior to joining [employer].       Review of Systems positive for recent problems with dysfunctional uterine bleeding and need for endometrial sampling     Objective:   Physical Exam HEENT exam is unremarkable. Her neck is supple. Her chest is clear to auscultation and percussion. Cardiac exam is regular rate no murmurs        Assessment & Plan:  Resume HCTZ at  25 a day. I did give her a short-term prescription for phentermine since her blood pressure here was 140/80. I gave her #30 phentermine but told her we cannot give her any more of this medication. She was agreeable to this and agrees to register with [employer] to see if she can get some help with her weight. She'll also begin a regular program at the gym

## 2012-09-23 NOTE — Patient Instructions (Addendum)
Obesity Obesity is defined as having too much total body fat and a body mass index (BMI) of 30 or more. BMI is an estimate of body fat and is calculated from your height and weight. Obesity happens when you consume more calories than you can burn by exercising or performing daily physical tasks. Prolonged obesity can cause major illnesses or emergencies, such as:   A stroke.  Heart disease.  Diabetes.  Cancer.  Arthritis.  High blood pressure (hypertension).  High cholesterol.  Sleep apnea.  Erectile dysfunction.  Infertility problems. CAUSES   Regularly eating unhealthy foods.  Physical inactivity.  Certain disorders, such as an underactive thyroid (hypothyroidism), Cushing's syndrome, and polycystic ovarian syndrome.  Certain medicines, such as steroids, some depression medicines, and antipsychotics.  Genetics.  Lack of sleep. DIAGNOSIS  A caregiver can diagnose obesity after calculating your BMI. Obesity will be diagnosed if your BMI is 30 or higher.  There are other methods of measuring obesity levels. Some other methods include measuring your skin fold thickness, your waist circumference, and comparing your hip circumference to your waist circumference. TREATMENT  A healthy treatment program includes some or all of the following:  Long-term dietary changes.  Exercise and physical activity.  Behavioral and lifestyle changes.  Medicine only under the supervision of your caregiver. Medicines may help, but only if they are used with diet and exercise programs. An unhealthy treatment program includes:  Fasting.  Fad diets.  Supplements and drugs. These choices do not succeed in long-term weight control.  HOME CARE INSTRUCTIONS   Exercise and perform physical activity as directed by your caregiver. To increase physical activity, try the following:  Use stairs instead of elevators.  Park farther away from store entrances.  Garden, bike, or walk instead of  watching television or using the computer.  Eat healthy, low-calorie foods and drinks on a regular basis. Eat more fruits and vegetables. Use low-calorie cookbooks or take healthy cooking classes.  Limit fast food, sweets, and processed snack foods.  Eat smaller portions.  Keep a daily journal of everything you eat. There are many free websites to help you with this. It may be helpful to measure your foods so you can determine if you are eating the correct portion sizes.  Avoid drinking alcohol. Drink more water and drinks without calories.  Take vitamins and supplements only as recommended by your caregiver.  Weight-loss support groups, Registered Dieticians, counselors, and stress reduction education can also be very helpful. SEEK IMMEDIATE MEDICAL CARE IF:  You have chest pain or tightness.  You have trouble breathing or feel short of breath.  You have weakness or leg numbness.  You feel confused or have trouble talking.  You have sudden changes in your vision. MAKE SURE YOU:  Understand these instructions.  Will watch your condition.  Will get help right away if you are not doing well or get worse. Document Released: 11/29/2004 Document Revised: 04/22/2012 Document Reviewed: 11/28/2011 ExitCare Patient Information 2013 [employer].  

## 2012-09-25 ENCOUNTER — Ambulatory Visit (INDEPENDENT_AMBULATORY_CARE_PROVIDER_SITE_OTHER): Payer: 59 | Admitting: Obstetrics & Gynecology

## 2012-09-25 ENCOUNTER — Encounter: Payer: Self-pay | Admitting: Obstetrics & Gynecology

## 2012-09-25 VITALS — BP 125/76 | HR 88 | Temp 98.5°F | Resp 12 | Ht 64.5 in | Wt 219.9 lb

## 2012-09-25 DIAGNOSIS — N938 Other specified abnormal uterine and vaginal bleeding: Secondary | ICD-10-CM

## 2012-09-25 DIAGNOSIS — N949 Unspecified condition associated with female genital organs and menstrual cycle: Secondary | ICD-10-CM

## 2012-09-25 MED ORDER — NORGESTIMATE-ETH ESTRADIOL 0.25-35 MG-MCG PO TABS: 1.0000 | ORAL_TABLET | Freq: Every day | ORAL | Status: DC

## 2012-09-25 NOTE — Progress Notes (Signed)
Subjective:     [name], female   DOB: Feb 07, 1966, 46 y.o.   MRN: [id number]  HPI  46 yo [id number] presents for f/u of results and discussion of treatment options for DUB. Pt c/o heavy irregular painful menstrual cycles since Sept.  S/p sonon, and endobx.  Not bleeding today but, only stopped 2 days previously.     Review of Systems     Objective:   Physical ExamBP 125/76  Pulse 88  Temp 98.5 F (36.9 C) (Oral)  Resp 12  Ht 5' 4.5" (1.638 m)  Wt 219 lb 14.4 oz (99.746 kg)  BMI 37.16 kg/m2  LMP 09/17/2012   Exam deferred  08/22/12 sono Findings:  Uterus: Is anteverted and anteflexed and demonstrates a sagittal  length of 10.9 cm, depth of 5.4 cm and width of 5.9 cm. A  homogeneous myometrium is noted. Several Nabothian cysts are seen  in the cervix.  Endometrium: Demonstrates an overall width of 13 mm. An area of  focal heterogeneity is noted in the fundal region measuring 2.1 by  0.9 mm and well imaged only in the sagittal plane. A small amount  of fluid surrounds this area and the appearance is suspicious for a  focal polyp. No significant flow is identified within this area and  no feeding has a is seen with color Doppler assessment to confirm  this impression.  Right ovary: Measures 1.8 x 1.7 x 1.2 cm and has a normal  appearance  Left ovary: Measures 2.3 x 2.3 x 1.6 cm and contains a follicle  Other findings: No pelvic fluid or separate adnexal masses are  seen.  IMPRESSION:  Area of focal soft tissue heterogeneity within the fundal portion  of the endometrial canal, suspicious sonographically for a polyp.  An area of focal hyperplasia or acute intraluminal clot in a  patient actively bleeding could have a similar appearance.  Endometrial carcinoma can have this appearance but is felt less  likely given the lack of flow with color Doppler assessment.  Normal ovaries.   09/12/12 Diagnosis Endometrium, biopsy BENIGN SECRETORY ENDOMETRIUM, NO  ATYPIA, HYPERPLASIA OR MALIGNANCY. 09/12/12 PAP: WNL, neg HRHPV     Assessment:     DUB- D/W pt results of  Endobx, sono and PAP.   d/w pt treatment options including OCP's, endometrial ablation, hysterectomy.  Pt want to start with OCP's.  No contraindications to OCP's    Plan:     sprintec 1 po q day F/u 3 months or sooner prn  Tresha Muzio L. Harraway-Smith, M.D., [facility]

## 2012-09-25 NOTE — Patient Instructions (Signed)
Dysfunctional Uterine Bleeding  Normally, menstrual periods begin between ages 11 to 17 in young women. A normal menstrual cycle/period may begin every 23 days up to 35 days and lasts from 1 to 7 days. Around 12 to 14 days before your menstrual period starts, ovulation (ovary produces an egg) occurs. When counting the time between menstrual periods, count from the first day of bleeding of the previous period to the first day of bleeding of the next period.  Dysfunctional (abnormal) uterine bleeding is bleeding that is different from a normal menstrual period. Your periods may come earlier or later than usual. They may be lighter, have blood clots or be heavier. You may have bleeding between periods, or you may skip one period or more. You may have bleeding after sexual intercourse, bleeding after menopause, or no menstrual period.  CAUSES   · Pregnancy (normal, miscarriage, tubal).  · IUDs (intrauterine device, birth control).  · Birth control pills.  · Hormone treatment.  · Menopause.  · Infection of the cervix.  · Blood clotting problems.  · Infection of the inside lining of the uterus.  · Endometriosis, inside lining of the uterus growing in the pelvis and other female organs.  · Adhesions (scar tissue) inside the uterus.  · Obesity or severe weight loss.  · Uterine polyps inside the uterus.  · Cancer of the vagina, cervix, or uterus.  · Ovarian cysts or polycystic ovary syndrome.  · Medical problems (diabetes, thyroid disease).  · Uterine fibroids (noncancerous tumor).  · Problems with your female hormones.  · Endometrial hyperplasia, very thick lining and enlarged cells inside of the uterus.  · Medicines that interfere with ovulation.  · Radiation to the pelvis or abdomen.  · Chemotherapy.  DIAGNOSIS   · Your doctor will discuss the history of your menstrual periods, medicines you are taking, changes in your weight, stress in your life, and any medical problems you may have.  · Your doctor will do a physical  and pelvic examination.  · Your doctor may want to perform certain tests to make a diagnosis, such as:  · Pap test.  · Blood tests.  · Cultures for infection.  · CT scan.  · Ultrasound.  · Hysteroscopy.  · Laparoscopy.  · MRI.  · Hysterosalpingography.  · D and C.  · Endometrial biopsy.  TREATMENT   Treatment will depend on the cause of the dysfunctional uterine bleeding (DUB). Treatment may include:  · Observing your menstrual periods for a couple of months.  · Prescribing medicines for medical problems, including:  · Antibiotics.  · Hormones.  · Birth control pills.  · Removing an IUD (intrauterine device, birth control).  · Surgery:  · D and C (scrape and remove tissue from inside the uterus).  · Laparoscopy (examine inside the abdomen with a lighted tube).  · Uterine ablation (destroy lining of the uterus with electrical current, laser, heat, or freezing).  · Hysteroscopy (examine cervix and uterus with a lighted tube).  · Hysterectomy (remove the uterus).  HOME CARE INSTRUCTIONS   · If medicines were prescribed, take exactly as directed. Do not change or switch medicines without consulting your caregiver.  · Long term heavy bleeding may result in iron deficiency. Your caregiver may have prescribed iron pills. They help replace the iron that your body lost from heavy bleeding. Take exactly as directed.  · Do not take aspirin or medicines that contain aspirin one week before or during your menstrual period. Aspirin may make   the bleeding worse.  · If you need to change your sanitary pad or tampon more than once every 2 hours, stay in bed with your feet elevated and a cold pack on your lower abdomen. Rest as much as possible, until the bleeding stops or slows down.  · Eat well-balanced meals. Eat foods high in iron. Examples are:  · Leafy green vegetables.  · Whole-grain breads and cereals.  · Eggs.  · Meat.  · Liver.  · Do not try to lose weight until the abnormal bleeding has stopped and your blood iron level is  back to normal. Do not lift more than ten pounds or do strenuous activities when you are bleeding.  · For a couple of months, make note on your calendar, marking the start and ending of your period, and the type of bleeding (light, medium, heavy, spotting, clots or missed periods). This is for your caregiver to better evaluate your problem.  SEEK MEDICAL CARE IF:   · You develop nausea (feeling sick to your stomach) and vomiting, dizziness, or diarrhea while you are taking your medicine.  · You are getting lightheaded or weak.  · You have any problems that may be related to the medicine you are taking.  · You develop pain with your DUB.  · You want to remove your IUD.  · You want to stop or change your birth control pills or hormones.  · You have any type of abnormal bleeding mentioned above.  · You are over 16 years old and have not had a menstrual period yet.  · You are 46 years old and you are still having menstrual periods.  · You have any of the symptoms mentioned above.  · You develop a rash.  SEEK IMMEDIATE MEDICAL CARE IF:   · An oral temperature above 102° F (38.9° C) develops.  · You develop chills.  · You are changing your sanitary pad or tampon more than once an hour.  · You develop abdominal pain.  · You pass out or faint.  Document Released: 10/19/2000 Document Revised: 01/14/2012 Document Reviewed: 09/20/2009  ExitCare® Patient Information ©2013 [employer].

## 2012-10-09 ENCOUNTER — Telehealth: Payer: Self-pay | Admitting: *Deleted

## 2012-10-09 NOTE — Telephone Encounter (Signed)
Pt left message stating that she was started on OCP's @ last visit and has developed acne and dark spots. She is requesting Rx for this problem.

## 2012-10-15 NOTE — Telephone Encounter (Signed)
Acne should improve over time.  Unsure what 'dark spots' are in this setting.  Pt may use OTC Benzoyl peroxide solutions or see a dermatologist.  clh-S

## 2012-10-22 NOTE — Telephone Encounter (Addendum)
Called pt and left message that I have information from Dr. Erin Fulling regarding her request. Please call back to the nurse voice mail and state when she may be reached or if she would like the information left on her voice mail. [phone]- pt returned my call and I informed her of the message from Dr. Erin Fulling. Pt voiced understanding and will try OTC Benzoyl peroxide solutions as recommended.

## 2012-11-11 ENCOUNTER — Other Ambulatory Visit: Payer: Self-pay | Admitting: Emergency Medicine

## 2012-11-17 ENCOUNTER — Other Ambulatory Visit: Payer: Self-pay | Admitting: Emergency Medicine

## 2013-02-08 ENCOUNTER — Ambulatory Visit: Payer: 59

## 2013-02-14 ENCOUNTER — Inpatient Hospital Stay (HOSPITAL_COMMUNITY)
Admission: AD | Admit: 2013-02-14 | Discharge: 2013-02-14 | Disposition: A | Discharge status: Home / Self Care | Payer: 59 | Source: Ambulatory Visit | Attending: Obstetrics and Gynecology | Admitting: Obstetrics and Gynecology

## 2013-02-14 ENCOUNTER — Encounter (HOSPITAL_COMMUNITY): Payer: Self-pay | Admitting: *Deleted

## 2013-02-14 DIAGNOSIS — N898 Other specified noninflammatory disorders of vagina: Secondary | ICD-10-CM

## 2013-02-14 DIAGNOSIS — N949 Unspecified condition associated with female genital organs and menstrual cycle: Secondary | ICD-10-CM

## 2013-02-14 DIAGNOSIS — B9689 Other specified bacterial agents as the cause of diseases classified elsewhere: Secondary | ICD-10-CM

## 2013-02-14 DIAGNOSIS — A499 Bacterial infection, unspecified: Secondary | ICD-10-CM | POA: Insufficient documentation

## 2013-02-14 DIAGNOSIS — N760 Acute vaginitis: Secondary | ICD-10-CM | POA: Insufficient documentation

## 2013-02-14 HISTORY — DX: Cyst of Bartholin's gland: N75.0

## 2013-02-14 LAB — POCT PREGNANCY, URINE: Preg Test, Ur: NEGATIVE

## 2013-02-14 LAB — URINALYSIS, ROUTINE W REFLEX MICROSCOPIC
Bilirubin Urine: NEGATIVE
Glucose, UA: NEGATIVE mg/dL
Specific Gravity, Urine: >1.03 — ABNORMAL HIGH (ref 1.005–1.030)
pH: 6 (ref 5.0–8.0)

## 2013-02-14 LAB — URINE MICROSCOPIC-ADD ON

## 2013-02-14 LAB — WET PREP, GENITAL: Yeast Wet Prep HPF POC: NONE SEEN

## 2013-02-14 MED ORDER — FLUCONAZOLE 150 MG PO TABS: 150.0000 mg | ORAL_TABLET | Freq: Once | ORAL | Status: DC

## 2013-02-14 MED ORDER — MOMETASONE FUROATE 0.1 % EX CREA: TOPICAL_CREAM | Freq: Every day | CUTANEOUS | Status: DC

## 2013-02-14 MED ORDER — CLINDAMYCIN HCL 300 MG PO CAPS: 300.0000 mg | ORAL_CAPSULE | Freq: Two times a day (BID) | ORAL | Status: DC

## 2013-02-14 NOTE — MAU Note (Signed)
Patient states she is having rectal pressure that started yesterday while at work. The pain continued through the night and woke her up. The pressure is now in between the rectum and the vagina.  She noticed a knot on the right side of her vagina.

## 2013-02-14 NOTE — MAU Provider Note (Signed)
History     CSN: [id number]  Arrival date and time: 02/14/13 7846   First Provider Initiated Contact with Patient 02/14/13 [phone]      Chief Complaint  Patient presents with  . Vaginal Pain   HPI 47 y.o. [id number] with rectal pressure last night. Thought needed to have BM but had normal BM and no relief. Worse this morning and felt throbbing pain in vagina and feels lump there now on right side. History or Bartholin gland cyst and worried might be same. Now rectal pressure gone. No fever/chills. No nausea/vomiting. No diarrhea/constipation or rectal bleeding. Has had some gray watery vaginal discharge. Is sexually active, no new partner (18 yrs). No hx STDs. On OCPs. No bleeding.   OB History   Grav Para Term Preterm Abortions TAB SAB Ect Mult Living   6 6 6       6      LMP 3 weeks ago, normal.  Past Medical History  Diagnosis Date  . Hypertension   . Anemia   . Menorrhagia   . Bartholin gland cyst     Past Surgical History  Procedure Laterality Date  . Bunionectomy  06/17/12  . Breast reduction surgery  04/2003  . Tubal ligation  01/1994    Family History  Problem Relation Age of Onset  . Heart disease Father   . Stroke Father   . Diabetes Son     History  Substance Use Topics  . Smoking status: Never Smoker   . Smokeless tobacco: Never Used  . Alcohol Use: No    Allergies: No Known Allergies  Prescriptions prior to admission  Medication Sig Dispense Refill  . hydrochlorothiazide (HYDRODIURIL) 25 MG tablet Take 1 tablet (25 mg total) by mouth daily.  30 tablet  5  . naproxen sodium (ALEVE) 220 MG tablet Take 220 mg by mouth as needed.      . norgestimate-ethinyl estradiol (SPRINTEC 28) 0.25-35 MG-MCG tablet Take 1 tablet by mouth daily.  1 Package  11  . ranitidine (ZANTAC) 75 MG tablet Take 75 mg by mouth 3 times/day as needed-between meals & bedtime for heartburn.        ROS  Negative except as stated in HPI.  Physical Exam   Blood pressure 139/82, pulse  104, temperature 98.3 F (36.8 C), temperature source Oral, resp. rate 18, height 5' 4.5" (1.638 m), weight 92.987 kg (205 lb), last menstrual period 01/31/2013.  Physical Exam  Constitutional: She is oriented to person, place, and time. She appears well-developed and well-nourished. No distress.  HENT:  Head: Normocephalic and atraumatic.  Eyes: Conjunctivae and EOM are normal.  Neck: Normal range of motion. Neck supple.  Cardiovascular: Normal rate, regular rhythm and normal heart sounds.   Respiratory: Effort normal and breath sounds normal. No respiratory distress.  GI: Soft. Bowel sounds are normal. She exhibits no distension. There is no tenderness. There is no rebound and no guarding.  Genitourinary: Uterus normal. Vaginal discharge (minimal white discharge) found.  Normal external genitalia and labia. Normal vagina except for 1 cm, tender, non-fluctuant, mobile mass just at introitus about 7:00 in vagina. No rectal masses, no stool in rectum, glove with soft brown stool. No hemorrhoids. No CMT or adnexal tenderness.  Musculoskeletal: Normal range of motion. She exhibits no edema and no tenderness.  Neurological: She is alert and oriented to person, place, and time.  Skin: Skin is warm and dry.  Psychiatric: She has a normal mood and affect.    MAU Course  Procedures  Results for orders placed during the hospital encounter of 02/14/13 (from the past 24 hour(s))  WET PREP, GENITAL     Status: Abnormal   Collection Time    02/14/13  8:11 AM      Result Value Range   Yeast Wet Prep HPF POC NONE SEEN  NONE SEEN   Trich, Wet Prep NONE SEEN  NONE SEEN   Clue Cells Wet Prep HPF POC MANY (*) NONE SEEN   WBC, Wet Prep HPF POC FEW (*) NONE SEEN    Assessment and Plan  47 y.o. female with  1.  Vaginal inclusion cyst.  2.  BV  Will treat with clindamycin as this may help if cyst is infected. Explained that cyst will likely resolve - suggested warm compresses. But if it gets larger  and infected, may need I&D, steroid injection, and/or antibiotics. And if recurs, may need to be excised.    Napoleon Form 02/14/2013, 8:07 AM

## 2013-02-16 NOTE — MAU Provider Note (Signed)
Attestation of Attending Supervision of Advanced Practitioner (CNM/NP): Evaluation and management procedures were performed by the Advanced Practitioner under my supervision and collaboration. I have reviewed the Advanced Practitioner's note and chart, and I agree with the management and plan.  Dabid Godown H. 9:59 PM

## 2013-03-06 ENCOUNTER — Encounter: Payer: 59 | Admitting: Obstetrics & Gynecology

## 2013-05-01 ENCOUNTER — Other Ambulatory Visit: Payer: Self-pay | Admitting: Emergency Medicine

## 2013-06-11 ENCOUNTER — Other Ambulatory Visit: Payer: Self-pay | Admitting: Physician Assistant

## 2013-06-11 ENCOUNTER — Ambulatory Visit: Payer: 59 | Admitting: Family Medicine

## 2013-06-16 ENCOUNTER — Telehealth: Payer: Self-pay

## 2013-06-16 MED ORDER — HYDROCHLOROTHIAZIDE 25 MG PO TABS: 25.0000 mg | ORAL_TABLET | Freq: Every day | ORAL | Status: DC

## 2013-06-16 NOTE — Telephone Encounter (Signed)
PT STATES SHE IS OUT OF HER HCTZ MEDICINE AND WAS TOLD BY THE PHARMACY SHE NEEDED AN OV, BUT SHE HAD MADE AN APPT WITH DR [facility] AND WE CANCELLED IT OURSELVES WHICH WASN'T HER FAULT. SHE JUST CAN'T COME IN SINCE SHE IS ON A TIGHT SCHEDULE AND HER HOURS DON'T FIT WITH OUR HOURS BUT SHE IS IN NEED OF HER MEDICINE PLEASE CALL [phone]    [facility] ON [address]

## 2013-06-16 NOTE — Telephone Encounter (Signed)
Sent the HCTZ, since pharmacy did not get previous one, will you please reschedule her appt.?

## 2013-06-18 NOTE — Telephone Encounter (Signed)
Left 2nd message for pt to reschedule her appt with Dr. Audria Nine.

## 2013-09-10 ENCOUNTER — Other Ambulatory Visit: Payer: Self-pay

## 2014-01-27 ENCOUNTER — Ambulatory Visit (INDEPENDENT_AMBULATORY_CARE_PROVIDER_SITE_OTHER): Payer: Private Health Insurance - Indemnity | Admitting: Obstetrics & Gynecology

## 2014-01-27 ENCOUNTER — Encounter: Payer: Self-pay | Admitting: Obstetrics & Gynecology

## 2014-01-27 VITALS — BP 143/90 | HR 78 | Temp 97.7°F | Ht 64.0 in | Wt 213.4 lb

## 2014-01-27 DIAGNOSIS — Z01812 Encounter for preprocedural laboratory examination: Secondary | ICD-10-CM

## 2014-01-27 DIAGNOSIS — N920 Excessive and frequent menstruation with regular cycle: Secondary | ICD-10-CM

## 2014-01-27 LAB — POCT PREGNANCY, URINE: Preg Test, Ur: NEGATIVE

## 2014-01-27 NOTE — Patient Instructions (Signed)

## 2014-01-27 NOTE — Progress Notes (Signed)
[name], female   DOB: [birth year], 48 y.o.   MRN: [id number] Patient desires surgical management with hysteroscopy with endometrial ablation.  She was seen in  2013 and had a full eval. She took the OCP's prescribed with improvement but, her sx returned when she stopped the OCP's.  She was offered an ablation prev and wants to have it now.  The risks of surgery were discussed in detail with the patient including but not limited to: bleeding which may require transfusion or reoperation; infection which may require prolonged hospitalization or re-hospitalization and antibiotic therapy; injury to bowel, bladder, ureters and major vessels or other surrounding organs; need for additional procedures including laparotomy; thromboembolic phenomenon, incisional problems and other postoperative or anesthesia complications.  Patient was told that the likelihood that her condition and symptoms will be treated effectively with this surgical management was very high; the postoperative expectations were also discussed in detail. The patient also understands the alternative treatment options which were discussed in full. All questions were answered.  She was told that she will be contacted by our surgical scheduler regarding the time and date of her surgery; routine preoperative instructions of having nothing to eat or drink after midnight on the day prior to surgery and also coming to the hospital 1 1/2 hours prior to her time of surgery were also emphasized.  She was told she may be called for a preoperative appointment about a week prior to surgery and will be given further preoperative instructions at that visit. Printed patient education handouts about the procedure were given to the patient to review at home.

## 2014-02-16 ENCOUNTER — Encounter (HOSPITAL_COMMUNITY): Payer: Self-pay | Admitting: Pharmacist

## 2014-02-23 ENCOUNTER — Encounter (HOSPITAL_COMMUNITY): Payer: Self-pay | Admitting: *Deleted

## 2014-03-01 ENCOUNTER — Encounter (HOSPITAL_COMMUNITY)
Admission: RE | Disposition: A | Discharge status: Home / Self Care | Payer: Self-pay | Source: Ambulatory Visit | Attending: Obstetrics & Gynecology

## 2014-03-01 ENCOUNTER — Encounter (HOSPITAL_COMMUNITY): Payer: Managed Care, Other (non HMO) | Admitting: Anesthesiology

## 2014-03-01 ENCOUNTER — Ambulatory Visit (HOSPITAL_COMMUNITY)
Admission: RE | Admit: 2014-03-01 | Discharge: 2014-03-01 | Disposition: A | Discharge status: Home / Self Care | Payer: Managed Care, Other (non HMO) | Source: Ambulatory Visit | Attending: Obstetrics & Gynecology | Admitting: Obstetrics & Gynecology

## 2014-03-01 ENCOUNTER — Ambulatory Visit (HOSPITAL_COMMUNITY): Payer: Managed Care, Other (non HMO) | Admitting: Anesthesiology

## 2014-03-01 ENCOUNTER — Encounter (HOSPITAL_COMMUNITY): Payer: Self-pay | Admitting: Anesthesiology

## 2014-03-01 DIAGNOSIS — N949 Unspecified condition associated with female genital organs and menstrual cycle: Secondary | ICD-10-CM

## 2014-03-01 DIAGNOSIS — I10 Essential (primary) hypertension: Secondary | ICD-10-CM | POA: Insufficient documentation

## 2014-03-01 DIAGNOSIS — N925 Other specified irregular menstruation: Secondary | ICD-10-CM | POA: Insufficient documentation

## 2014-03-01 DIAGNOSIS — N938 Other specified abnormal uterine and vaginal bleeding: Secondary | ICD-10-CM | POA: Insufficient documentation

## 2014-03-01 DIAGNOSIS — N946 Dysmenorrhea, unspecified: Secondary | ICD-10-CM | POA: Insufficient documentation

## 2014-03-01 DIAGNOSIS — Z6834 Body mass index (BMI) 34.0-34.9, adult: Secondary | ICD-10-CM | POA: Insufficient documentation

## 2014-03-01 DIAGNOSIS — N840 Polyp of corpus uteri: Secondary | ICD-10-CM | POA: Insufficient documentation

## 2014-03-01 DIAGNOSIS — N750 Cyst of Bartholin's gland: Secondary | ICD-10-CM | POA: Insufficient documentation

## 2014-03-01 DIAGNOSIS — D649 Anemia, unspecified: Secondary | ICD-10-CM | POA: Insufficient documentation

## 2014-03-01 HISTORY — PX: HYSTEROSCOPY WITH NOVASURE: SHX5574

## 2014-03-01 LAB — CBC
HCT: 28.5 % — ABNORMAL LOW (ref 36.0–46.0)
Hemoglobin: 8.4 g/dL — ABNORMAL LOW (ref 12.0–15.0)
MCH: 21.3 pg — AB (ref 26.0–34.0)
MCHC: 29.5 g/dL — AB (ref 30.0–36.0)
MCV: 72.2 fL — AB (ref 78.0–100.0)
PLATELETS: 439 10*3/uL — AB (ref 150–400)
RBC: 3.95 MIL/uL (ref 3.87–5.11)
RDW: 15.1 % (ref 11.5–15.5)
WBC: 4.8 10*3/uL (ref 4.0–10.5)

## 2014-03-01 LAB — PREGNANCY, URINE: PREG TEST UR: NEGATIVE

## 2014-03-01 SURGERY — HYSTEROSCOPY WITH NOVASURE
Anesthesia: General | Site: Uterus

## 2014-03-01 MED ORDER — LACTATED RINGERS IV SOLN
INTRAVENOUS | Status: DC
  Administered 2014-03-01 (×2): via INTRAVENOUS

## 2014-03-01 MED ORDER — MIDAZOLAM HCL 2 MG/2ML IJ SOLN
INTRAMUSCULAR | Status: AC
  Filled 2014-03-01: qty 2

## 2014-03-01 MED ORDER — DICLOFENAC SODIUM ER 100 MG PO TB24: 100.0000 mg | ORAL_TABLET | Freq: Two times a day (BID) | ORAL | Status: DC | PRN

## 2014-03-01 MED ORDER — FENTANYL CITRATE 0.05 MG/ML IJ SOLN
25.0000 ug | INTRAMUSCULAR | Status: DC | PRN
  Administered 2014-03-01: 25 ug via INTRAVENOUS
  Administered 2014-03-01: 50 ug via INTRAVENOUS
  Administered 2014-03-01: 25 ug via INTRAVENOUS

## 2014-03-01 MED ORDER — KETOROLAC TROMETHAMINE 30 MG/ML IJ SOLN
INTRAMUSCULAR | Status: DC | PRN
Start: 2014-03-01 — End: 2014-03-01
  Administered 2014-03-01: 30 mg via INTRAVENOUS

## 2014-03-01 MED ORDER — ONDANSETRON HCL 4 MG/2ML IJ SOLN
INTRAMUSCULAR | Status: AC
  Filled 2014-03-01: qty 2

## 2014-03-01 MED ORDER — FENTANYL CITRATE 0.05 MG/ML IJ SOLN
INTRAMUSCULAR | Status: AC
  Filled 2014-03-01: qty 2

## 2014-03-01 MED ORDER — KETOROLAC TROMETHAMINE 30 MG/ML IJ SOLN
INTRAMUSCULAR | Status: AC
  Filled 2014-03-01: qty 1

## 2014-03-01 MED ORDER — KETOROLAC TROMETHAMINE 30 MG/ML IJ SOLN: 15.0000 mg | Freq: Once | INTRAMUSCULAR | Status: DC | PRN

## 2014-03-01 MED ORDER — BUPIVACAINE HCL (PF) 0.5 % IJ SOLN
INTRAMUSCULAR | Status: AC
  Filled 2014-03-01: qty 30

## 2014-03-01 MED ORDER — MEPERIDINE HCL 25 MG/ML IJ SOLN: 6.2500 mg | INTRAMUSCULAR | Status: DC | PRN

## 2014-03-01 MED ORDER — LIDOCAINE HCL (CARDIAC) 20 MG/ML IV SOLN
INTRAVENOUS | Status: DC | PRN
Start: 2014-03-01 — End: 2014-03-01
  Administered 2014-03-01: 30 mg via INTRAVENOUS
  Administered 2014-03-01: 70 mg via INTRAVENOUS

## 2014-03-01 MED ORDER — BUPIVACAINE HCL (PF) 0.5 % IJ SOLN
INTRAMUSCULAR | Status: DC | PRN
  Administered 2014-03-01: 30 mL

## 2014-03-01 MED ORDER — ONDANSETRON HCL 4 MG/2ML IJ SOLN
INTRAMUSCULAR | Status: DC | PRN
  Administered 2014-03-01: 4 mg via INTRAVENOUS

## 2014-03-01 MED ORDER — LIDOCAINE HCL (CARDIAC) 20 MG/ML IV SOLN
INTRAVENOUS | Status: AC
  Filled 2014-03-01: qty 5

## 2014-03-01 MED ORDER — FENTANYL CITRATE 0.05 MG/ML IJ SOLN
INTRAMUSCULAR | Status: AC
  Administered 2014-03-01: 25 ug via INTRAVENOUS
  Filled 2014-03-01: qty 2

## 2014-03-01 MED ORDER — MIDAZOLAM HCL 2 MG/2ML IJ SOLN
INTRAMUSCULAR | Status: DC | PRN
  Administered 2014-03-01: 2 mg via INTRAVENOUS

## 2014-03-01 MED ORDER — ONDANSETRON HCL 4 MG/2ML IJ SOLN: 4.0000 mg | Freq: Once | INTRAMUSCULAR | Status: DC | PRN

## 2014-03-01 MED ORDER — PROPOFOL 10 MG/ML IV BOLUS
INTRAVENOUS | Status: DC | PRN
Start: 2014-03-01 — End: 2014-03-01
  Administered 2014-03-01: 180 mg via INTRAVENOUS

## 2014-03-01 MED ORDER — LACTATED RINGERS IV SOLN
INTRAVENOUS | Status: DC | PRN
  Administered 2014-03-01: 1000 mL via INTRAVENOUS

## 2014-03-01 MED ORDER — PROPOFOL 10 MG/ML IV EMUL
INTRAVENOUS | Status: AC
  Filled 2014-03-01: qty 20

## 2014-03-01 MED ORDER — FENTANYL CITRATE 0.05 MG/ML IJ SOLN
INTRAMUSCULAR | Status: DC | PRN
Start: 2014-03-01 — End: 2014-03-01
  Administered 2014-03-01 (×2): 50 ug via INTRAVENOUS

## 2014-03-01 MED ORDER — LACTATED RINGERS IV SOLN: INTRAVENOUS | Status: DC

## 2014-03-01 SURGICAL SUPPLY — 20 items
ABLATOR ENDOMETRIAL BIPOLAR (ABLATOR) ×3 IMPLANT
CATH ROBINSON RED A/P 16FR (CATHETERS) ×3 IMPLANT
CLOTH BEACON ORANGE TIMEOUT ST (SAFETY) ×3 IMPLANT
CONTAINER PREFILL 10% NBF 60ML (FORM) IMPLANT
DRAPE HYSTEROSCOPY (DRAPE) ×3 IMPLANT
DRSG TELFA 3X8 NADH (GAUZE/BANDAGES/DRESSINGS) ×3 IMPLANT
GLOVE BIO SURGEON STRL SZ7 (GLOVE) ×3 IMPLANT
GLOVE BIOGEL PI IND STRL 7.0 (GLOVE) ×1 IMPLANT
GLOVE BIOGEL PI INDICATOR 7.0 (GLOVE) ×2
GOWN STRL REUS W/TWL LRG LVL3 (GOWN DISPOSABLE) ×6 IMPLANT
NDL SPNL 22GX3.5 QUINCKE BK (NEEDLE) ×1 IMPLANT
NEEDLE SPNL 22GX3.5 QUINCKE BK (NEEDLE) ×3 IMPLANT
PACK VAGINAL MINOR WOMEN LF (CUSTOM PROCEDURE TRAY) ×3 IMPLANT
PAD DRESSING TELFA 3X8 NADH (GAUZE/BANDAGES/DRESSINGS) ×1 IMPLANT
PAD OB MATERNITY 4.3X12.25 (PERSONAL CARE ITEMS) ×3 IMPLANT
SET TUBING HYSTEROSCOPY 2 NDL (TUBING) IMPLANT
SYR CONTROL 10ML LL (SYRINGE) ×3 IMPLANT
TOWEL OR 17X24 6PK STRL BLUE (TOWEL DISPOSABLE) ×6 IMPLANT
TUBE HYSTEROSCOPY W Y-CONNECT (TUBING) IMPLANT
WATER STERILE IRR 1000ML POUR (IV SOLUTION) ×3 IMPLANT

## 2014-03-01 NOTE — Transfer of Care (Signed)
Immediate Anesthesia Transfer of Care Note  Patient: Lori Green  Procedure(s) Performed: Procedure(s): HYSTEROSCOPY WITH NOVASURE (N/A)  Patient Location: PACU  Anesthesia Type:General  Level of Consciousness: awake, alert , oriented and patient cooperative  Airway & Oxygen Therapy: Patient Spontanous Breathing and Patient connected to nasal cannula oxygen  Post-op Assessment: Report given to PACU RN and Post -op Vital signs reviewed and stable  Post vital signs: Reviewed and stable  Complications: No apparent anesthesia complications

## 2014-03-01 NOTE — Anesthesia Postprocedure Evaluation (Signed)
Anesthesia Post Note  Patient: Lori Green  Procedure(s) Performed: Procedure(s) (LRB): HYSTEROSCOPY WITH NOVASURE (N/A)  Anesthesia type: General  Patient location: PACU  Post pain: Pain level controlled  Post assessment: Post-op Vital signs reviewed  Last Vitals:  Filed Vitals:   03/01/14 1345  BP:   Pulse: 74  Temp:   Resp: 17    Post vital signs: Reviewed  Level of consciousness: sedated  Complications: No apparent anesthesia complications

## 2014-03-01 NOTE — Op Note (Signed)
03/01/2014  1:48 PM  PATIENT:  Lori Green  48 y.o. female  PRE-OPERATIVE DIAGNOSIS:  DYSFUNCTIONAL UTERINE BLEEDING  POST-OPERATIVE DIAGNOSIS:  DYSFUNCTIONAL UTERINE BLEEDING  PROCEDURE:  Procedure(s): HYSTEROSCOPY WITH NOVASURE (N/A); endometrial polypectomy  SURGEON:  Surgeon(s) and Role:    * Lavonia Drafts, MD - Primary  ANESTHESIA:   general  EBL:  Total I/O In: 1300 [I.V.:1300] Out: 60 [Urine:60]  BLOOD ADMINISTERED:none  DRAINS: none   LOCAL MEDICATIONS USED:  MARCAINE     SPECIMEN:  Source of Specimen:  endometrial polyp and endometrial currettings  DISPOSITION OF SPECIMEN:  PATHOLOGY  COUNTS:  YES  TOURNIQUET:  * No tourniquets in log *  DICTATION: .Note written in EPIC  PLAN OF CARE: Discharge to home after PACU  PATIENT DISPOSITION:  PACU - hemodynamically stable.   Delay start of Pharmacological VTE agent (>24hrs) due to surgical blood loss or risk of bleeding: not applicable  The risks, benefits, and alternatives of surgery were explained, understood, and accepted. The consents were signed and all questions were answered. She was taken to the operating room and general anesthesia was applied without complication. She was placed in the dorsal lithotomy position and her vagina and abdomen were prepped and draped in the usual sterile fashion. A bimanual exam revealed a normal size and shape anteverted mobile uterus. Her adnexa were non-enlarged.   A bivalved speculum was placed in the patients' vagina and the anterior lip of the cervix was grasped with a single toothed tenaculum. A paracervical block was performed at 5 and 7 o'clock with 20cc of 0.5% Marcaine.   The endometrial cavity was sounded to 10.5cm and the endocervical length measured 3cm. A hysteroscope was inserted and the endometrium was noted to be thickened with a large polyp.  The polyp was removed with polyp forceps. The ostia on both sides were noted.  The scope was removed and a  sharp currete was used to scape the lining of the uterus until a gritty texture was noted throughout.  Specimens were sent to pathology.  The NovaSure device was then inserted and seated using 6.5cm as the cavity length and 2.7cm as the cavity width.  The total activation time was 105 sec. The hysteroscope was reinserted and an light burn pattern was noted to the fundus.  The single toothed tenaculum was removed at the end of the case and no bleeding was noted from the cervix.   The patient was extubated and taken to the recovery room in stable condition.  Sponge, lap and instrument counts were correct.  There were no complications. Jisela Merlino L. Harraway-Smith, M.D., [name]

## 2014-03-01 NOTE — H&P (Signed)
HPI 48 yo [id number] presents for f/u of results and discussion of treatment options for DUB. Pt c/o heavy irregular painful menstrual cycles since Sept. S/p PAP and endobx.   Past Medical History  Diagnosis Date  . Menorrhagia   . Bartholin gland cyst   . Hypertension     no meds-wt loss helped HTN   Past Surgical History  Procedure Laterality Date  . Bunionectomy  06/17/12  . Breast reduction surgery  04/2003  . Tubal ligation  01/1994   No current facility-administered medications on file prior to encounter.   Current Outpatient Prescriptions on File Prior to Encounter  Medication Sig Dispense Refill  . Linaclotide (LINZESS) 290 MCG CAPS capsule Take 290 mcg by mouth as needed (constipation).       No Known Allergies    Review of Systems  Objective:   Physical Exam: BP 140/78  Pulse 84  Temp(Src) 99.1 F (37.3 C) (Oral)  Resp 20  Ht 5\' 4"  (1.626 m)  Wt 203 lb (92.08 kg)  BMI 34.83 kg/m2  SpO2 100%  LMP 02/21/2014   Exam deferred  08/22/12 sono  Findings:  Uterus: Is anteverted and anteflexed and demonstrates a sagittal  length of 10.9 cm, depth of 5.4 cm and width of 5.9 cm. A  homogeneous myometrium is noted. Several Nabothian cysts are seen  in the cervix.  Endometrium: Demonstrates an overall width of 13 mm. An area of  focal heterogeneity is noted in the fundal region measuring 2.1 by  0.9 mm and well imaged only in the sagittal plane. A small amount  of fluid surrounds this area and the appearance is suspicious for a  focal polyp. No significant flow is identified within this area and  no feeding has a is seen with color Doppler assessment to confirm  this impression.  Right ovary: Measures 1.8 x 1.7 x 1.2 cm and has a normal  appearance  Left ovary: Measures 2.3 x 2.3 x 1.6 cm and contains a follicle  Other findings: No pelvic fluid or separate adnexal masses are  seen.  IMPRESSION:  Area of focal soft tissue heterogeneity within the fundal portion  of  the endometrial canal, suspicious sonographically for a polyp.  An area of focal hyperplasia or acute intraluminal clot in a  patient actively bleeding could have a similar appearance.  Endometrial carcinoma can have this appearance but is felt less  likely given the lack of flow with color Doppler assessment.  Normal ovaries.  09/12/12  Diagnosis  Endometrium, biopsy  BENIGN SECRETORY ENDOMETRIUM, NO ATYPIA, HYPERPLASIA OR MALIGNANCY.  09/12/12  PAP: WNL, neg HRHPV   CBC    Component Value Date/Time   WBC 4.8 03/01/2014 1030   RBC 3.95 03/01/2014 1030   HGB 8.4* 03/01/2014 1030   HCT 28.5* 03/01/2014 1030   PLT 439* 03/01/2014 1030   MCV 72.2* 03/01/2014 1030   MCH 21.3* 03/01/2014 1030   MCHC 29.5* 03/01/2014 1030   RDW 15.1 03/01/2014 1030   LYMPHSABS 2.2 09/15/2009 1456   MONOABS 0.4 09/15/2009 1456   EOSABS 0.0 09/15/2009 1456   BASOSABS 0.0 09/15/2009 1456     Assessment:   DUB- Pt failed conservative treatment and desires to proceed with endometrial ablation   Plan:   Patient desires surgical management with hysteroscopy with endometrial ablation.  The risks of surgery were discussed in detail with the patient including but not limited to: bleeding which may require transfusion or reoperation; infection which may require prolonged hospitalization or  re-hospitalization and antibiotic therapy;perforaton,  injury to bowel, bladder, ureters and major vessels or other surrounding organs; need for additional procedures including laparotomy and thromboembolic phenomenon and other postoperative or anesthesia complications.  Patient was told that the likelihood that her condition and symptoms will be treated effectively with this surgical management was very high; the postoperative expectations were also discussed in detail. The patient also understands the alternative treatment options which were discussed in full. All questions were answered.

## 2014-03-01 NOTE — Brief Op Note (Signed)
03/01/2014  1:48 PM  PATIENT:  Lori Green  48 y.o. female  PRE-OPERATIVE DIAGNOSIS:  DYSFUNCTIONAL UTERINE BLEEDING  POST-OPERATIVE DIAGNOSIS:  DYSFUNCTIONAL UTERINE BLEEDING  PROCEDURE:  Procedure(s): HYSTEROSCOPY WITH NOVASURE (N/A); endometrial polypectomy  SURGEON:  Surgeon(s) and Role:    * Lavonia Drafts, MD - Primary  ANESTHESIA:   general  EBL:  Total I/O In: 1300 [I.V.:1300] Out: 60 [Urine:60]  BLOOD ADMINISTERED:none  DRAINS: none   LOCAL MEDICATIONS USED:  MARCAINE     SPECIMEN:  Source of Specimen:  endometrial polyp and endometrial currettings  DISPOSITION OF SPECIMEN:  PATHOLOGY  COUNTS:  YES  TOURNIQUET:  * No tourniquets in log *  DICTATION: .Note written in EPIC  PLAN OF CARE: Discharge to home after PACU  PATIENT DISPOSITION:  PACU - hemodynamically stable.   Delay start of Pharmacological VTE agent (>24hrs) due to surgical blood loss or risk of bleeding: not applicable

## 2014-03-01 NOTE — Anesthesia Preprocedure Evaluation (Signed)
Anesthesia Evaluation  Patient identified by MRN, date of birth, ID band Patient awake    Reviewed: Allergy & Precautions, H&P , NPO status , Patient's Chart, lab work & pertinent test results  Airway Mallampati: II TM Distance: >3 FB Neck ROM: Full    Dental no notable dental hx. (+) Teeth Intact   Pulmonary neg pulmonary ROS,  breath sounds clear to auscultation  Pulmonary exam normal       Cardiovascular hypertension, negative cardio ROS  Rhythm:Regular Rate:Normal     Neuro/Psych negative neurological ROS  negative psych ROS   GI/Hepatic negative GI ROS, Neg liver ROS,   Endo/Other  Morbid obesityObesity  Renal/GU negative Renal ROS  negative genitourinary   Musculoskeletal negative musculoskeletal ROS (+)   Abdominal (+) + obese,   Peds  Hematology  (+) anemia ,   Anesthesia Other Findings   Reproductive/Obstetrics negative OB ROS Menorrhagia                           Anesthesia Physical Anesthesia Plan  ASA: II  Anesthesia Plan: General   Post-op Pain Management:    Induction: Intravenous  Airway Management Planned: LMA  Additional Equipment:   Intra-op Plan:   Post-operative Plan: Extubation in OR  Informed Consent: I have reviewed the patients History and Physical, chart, labs and discussed the procedure including the risks, benefits and alternatives for the proposed anesthesia with the patient or authorized representative who has indicated his/her understanding and acceptance.   Dental advisory given  Plan Discussed with: Anesthesiologist, CRNA and Surgeon  Anesthesia Plan Comments:         Anesthesia Quick Evaluation

## 2014-03-01 NOTE — Discharge Instructions (Signed)
DISCHARGE INSTRUCTIONS: HYSTEROSCOPY / ENDOMETRIAL ABLATION The following instructions have been prepared to help you care for yourself upon your return home.  May take Ibuprofen after 6:20pm as needed for crampsM   Personal hygiene:  Use sanitary pads for vaginal drainage, not tampons.  Shower the day after your procedure.  NO tub baths, pools or Jacuzzis for 2-3 weeks.  Wipe front to back after using the bathroom.  Activity and limitations:  Do NOT drive or operate any equipment for 24 hours. The effects of anesthesia are still present and drowsiness may result.  Do NOT rest in bed all day.  Walking is encouraged.  Walk up and down stairs slowly.  You may resume your normal activity in one to two days or as indicated by your physician. Sexual activity: NO intercourse for at least 2 weeks after the procedure, or as indicated by your Doctor.  Diet: Eat a light meal as desired this evening. You may resume your usual diet tomorrow.  Return to Work: You may resume your work activities in one to two days or as indicated by Marine scientist.  What to expect after your surgery: Expect to have vaginal bleeding/discharge for 2-3 days and spotting for up to 10 days. It is not unusual to have soreness for up to 1-2 weeks. You may have a slight burning sensation when you urinate for the first day. Mild cramps may continue for a couple of days. You may have a regular period in 2-6 weeks.  Call your doctor for any of the following:  Excessive vaginal bleeding or clotting, saturating and changing one pad every hour.  Inability to urinate 6 hours after discharge from hospital.  Pain not relieved by pain medication.  Fever of 100.4 F or greater.  Unusual vaginal discharge or odor.  Follow up with your doctor in 2 weeks  Patient signature______________________________  Nurse Signature____________________________  Support person's signature________________________________  [facility] [phone]

## 2014-03-02 ENCOUNTER — Encounter (HOSPITAL_COMMUNITY): Payer: Self-pay | Admitting: Obstetrics & Gynecology

## 2014-03-18 ENCOUNTER — Ambulatory Visit (INDEPENDENT_AMBULATORY_CARE_PROVIDER_SITE_OTHER): Payer: Managed Care, Other (non HMO) | Admitting: Obstetrics & Gynecology

## 2014-03-18 ENCOUNTER — Encounter: Payer: Self-pay | Admitting: Obstetrics & Gynecology

## 2014-03-18 VITALS — BP 147/83 | HR 75 | Temp 98.9°F | Ht 64.0 in | Wt 210.8 lb

## 2014-03-18 DIAGNOSIS — Z09 Encounter for follow-up examination after completed treatment for conditions other than malignant neoplasm: Secondary | ICD-10-CM

## 2014-03-18 DIAGNOSIS — Z9889 Other specified postprocedural states: Secondary | ICD-10-CM

## 2014-03-18 NOTE — Progress Notes (Signed)
Here for post op. C/o cramping past 2 days .States discharge varies yellow/ pinkish/ bright red. States has been having nausea off and on since surgery.

## 2014-03-18 NOTE — Patient Instructions (Signed)

## 2014-03-18 NOTE — Progress Notes (Signed)
Subjective:     [name], female   DOB: 18-Aug-1966, 48 y.o.   MRN: [id number]  HPI Pt presents for 2 week post op check.  She denies problems currently.   She does report cramping for the past 2 days.  Review of Systems     Objective:   Physical Exam BP 147/83  Pulse 75  Temp(Src) 98.9 F (37.2 C)  Ht 5\' 4"  (1.626 m)  Wt 210 lb 12.8 oz (95.618 kg)  BMI 36.17 kg/m2  LMP 02/21/2014 Abd: soft, NT; ND  03/01/2014 Diagnosis Endometrium, curettage - FRAGMENTS OF BENIGN ENDOMETRIAL POLYP; NEGATIVE FOR ATYPIA OR MALIGNANCY. - SEPARATE FRAGMENTS OF LATE PROLIFERATIVE-EARLY SECRETORY PHASE ENDOMETRIUM WITH BREAKDOWN; NEGATIVE FOR HYPERPLASIA OR MALIGNANCY. - BENIGN ENDOCERVICAL MUCOSA. - FRAGMENTS OF BENIGN SMOOTH MUSCLE. - DETACHED FRAGMENTS OF SQUAMOUS EPITHELIUM; NEGATIVE FOR INTRAEPITHELIAL LESION OR MALIGNANCY.     Assessment:     Post op f/u from hysteroscopy, polypectomy and endometrial ablation     Plan:     F/u 3 months or sooner prn

## 2014-03-19 ENCOUNTER — Other Ambulatory Visit: Payer: Self-pay | Admitting: Obstetrics & Gynecology

## 2014-03-19 ENCOUNTER — Telehealth: Payer: Self-pay | Admitting: General Practice

## 2014-03-19 MED ORDER — OXYCODONE-ACETAMINOPHEN 5-325 MG PO TABS: 2.0000 | ORAL_TABLET | ORAL | Status: DC | PRN

## 2014-03-19 MED ORDER — MEDROXYPROGESTERONE ACETATE 10 MG PO TABS: 20.0000 mg | ORAL_TABLET | Freq: Every day | ORAL | Status: DC

## 2014-03-19 NOTE — Telephone Encounter (Signed)
Patient called and left message on nurse line stating she had a novasure ablation on 4/27 and started bleeding very heavy after leaving the office yesterday and is in severe pain. Would like call back as soon as possible on [phone].

## 2014-03-26 NOTE — Telephone Encounter (Signed)
I spoke to pt directly via telephone.  She wanted to try the Provera or Megace first.  She will call if it fails to stop her bleeding prior to her next visit.  She is aware that the next step would be hyst.  Hoyle Sauer L. Harraway-Smith, M.D., [name]

## 2014-04-02 ENCOUNTER — Encounter: Payer: Self-pay | Admitting: Obstetrics & Gynecology

## 2014-04-02 ENCOUNTER — Ambulatory Visit (INDEPENDENT_AMBULATORY_CARE_PROVIDER_SITE_OTHER): Payer: Managed Care, Other (non HMO) | Admitting: Obstetrics & Gynecology

## 2014-04-02 VITALS — BP 114/82 | HR 81 | Temp 98.8°F | Ht 64.0 in | Wt 208.7 lb

## 2014-04-02 DIAGNOSIS — N938 Other specified abnormal uterine and vaginal bleeding: Secondary | ICD-10-CM

## 2014-04-02 DIAGNOSIS — N949 Unspecified condition associated with female genital organs and menstrual cycle: Secondary | ICD-10-CM

## 2014-04-02 NOTE — Progress Notes (Signed)
Subjective:     [name] Height, female   DOB: May 24, 1966, 48 y.o.   MRN: [id number]  HPI Pt is s/p endometrial ablation with continued bleeding.  She reports that when she stops the Provera the bleeding returns.  She wants to try conservative treatment to see if it will work     Review of Systems     Objective:   Physical Exam BP 114/82  Pulse 81  Temp(Src) 98.8 F (37.1 C) (Oral)  Ht 5\' 4"  (1.626 m)  Wt 208 lb 11.2 oz (94.666 kg)  BMI 35.81 kg/m2  Pt in NAD Exam deferred    03/01/2014 Diagnosis Endometrium, curettage - FRAGMENTS OF BENIGN ENDOMETRIAL POLYP; NEGATIVE FOR ATYPIA OR MALIGNANCY. - SEPARATE FRAGMENTS OF LATE PROLIFERATIVE-EARLY SECRETORY PHASE ENDOMETRIUM WITH BREAKDOWN; NEGATIVE FOR HYPERPLASIA OR MALIGNANCY. - BENIGN ENDOCERVICAL MUCOSA. - FRAGMENTS OF BENIGN SMOOTH MUSCLE. - DETACHED FRAGMENTS OF SQUAMOUS EPITHELIUM; NEGATIVE FOR INTRAEPITHELIAL LESION OR MALIGNANCY. Assessment:    Bleeding after endometrial ablation  Mirena IUD vs TVH     Plan:     Provera 20mg  daily  F/u 3weeks via telephone to discuss bleeding after weaning meds after next cycle.

## 2014-04-02 NOTE — Patient Instructions (Signed)
Endovenous Ablation Care After Refer to this sheet in the next few weeks. These instructions provide you with information on caring for yourself after your procedure. Your caregiver may also give you more specific instructions. Your treatment has been planned according to current medical practices, but problems sometimes occur. Call your caregiver if you have any problems or questions after your procedure. HOME CARE INSTRUCTIONS   Only take over-the-counter or prescription medicines for pain or discomfort as directed by your caregiver.  Follow any diet instructions from your caregiver.  Follow your caregiver's instructions for rest, bathing, showering, and physical activity.  Walk regularly. This will help with healing and help prevent blood clots from forming.  Avoid strenuous workouts as directed by your caregiver.  Avoid long car trips and air travel for 2 weeks after the procedure, or as directed.  Continue to wear compression stockings for 1 week, or as directed. SEEK IMMEDIATE MEDICAL CARE IF:   You have a fever.  You develop pain, redness, or swelling around the surgical cut (incision).  You notice red streaks on the skin coming from the incision.  You start to bleed.  You develop nausea or vomiting.  You have trouble breathing.  You develop chest pain. MAKE SURE YOU:   Understand these instructions.  Will watch your condition.  Will get help right away if you are not doing well or get worse. Document Released: 10/11/2011 Document Revised: 01/14/2012 Document Reviewed: 10/11/2011 [facility] Patient Information 2014 [place], [place]. Dysfunctional Uterine Bleeding Normally, menstrual periods begin between ages 39 to 31 in young women. A normal menstrual cycle/period may begin every 23 days up to 35 days and lasts from 1 to 7 days. Around 12 to 14 days before your menstrual period starts, ovulation (ovary produces an egg) occurs. When counting the time between menstrual  periods, count from the first day of bleeding of the previous period to the first day of bleeding of the next period. Dysfunctional (abnormal) uterine bleeding is bleeding that is different from a normal menstrual period. Your periods may come earlier or later than usual. They may be lighter, have blood clots or be heavier. You may have bleeding between periods, or you may skip one period or more. You may have bleeding after sexual intercourse, bleeding after menopause, or no menstrual period. CAUSES   Pregnancy (normal, miscarriage, tubal).  IUDs (intrauterine device, birth control).  Birth control pills.  Hormone treatment.  Menopause.  Infection of the cervix.  Blood clotting problems.  Infection of the inside lining of the uterus.  Endometriosis, inside lining of the uterus growing in the pelvis and other female organs.  Adhesions (scar tissue) inside the uterus.  Obesity or severe weight loss.  Uterine polyps inside the uterus.  Cancer of the vagina, cervix, or uterus.  Ovarian cysts or polycystic ovary syndrome.  Medical problems (diabetes, thyroid disease).  Uterine fibroids (noncancerous tumor).  Problems with your female hormones.  Endometrial hyperplasia, very thick lining and enlarged cells inside of the uterus.  Medicines that interfere with ovulation.  Radiation to the pelvis or abdomen.  Chemotherapy. DIAGNOSIS   Your doctor will discuss the history of your menstrual periods, medicines you are taking, changes in your weight, stress in your life, and any medical problems you may have.  Your doctor will do a physical and pelvic examination.  Your doctor may want to perform certain tests to make a diagnosis, such as:  Pap test.  Blood tests.  Cultures for infection.  CT scan.  Ultrasound.  Hysteroscopy.  Laparoscopy.  MRI.  Hysterosalpingography.  D and C.  Endometrial biopsy. TREATMENT  Treatment will depend on the cause of the  dysfunctional uterine bleeding (DUB). Treatment may include:  Observing your menstrual periods for a couple of months.  Prescribing medicines for medical problems, including:  Antibiotics.  Hormones.  Birth control pills.  Removing an IUD (intrauterine device, birth control).  Surgery:  D and C (scrape and remove tissue from inside the uterus).  Laparoscopy (examine inside the abdomen with a lighted tube).  Uterine ablation (destroy lining of the uterus with electrical current, laser, heat, or freezing).  Hysteroscopy (examine cervix and uterus with a lighted tube).  Hysterectomy (remove the uterus). HOME CARE INSTRUCTIONS   If medicines were prescribed, take exactly as directed. Do not change or switch medicines without consulting your caregiver.  Long term heavy bleeding may result in iron deficiency. Your caregiver may have prescribed iron pills. They help replace the iron that your body lost from heavy bleeding. Take exactly as directed.  Do not take aspirin or medicines that contain aspirin one week before or during your menstrual period. Aspirin may make the bleeding worse.  If you need to change your sanitary pad or tampon more than once every 2 hours, stay in bed with your feet elevated and a cold pack on your lower abdomen. Rest as much as possible, until the bleeding stops or slows down.  Eat well-balanced meals. Eat foods high in iron. Examples are:  Leafy green vegetables.  Whole-grain breads and cereals.  Eggs.  Meat.  Liver.  Do not try to lose weight until the abnormal bleeding has stopped and your blood iron level is back to normal. Do not lift more than ten pounds or do strenuous activities when you are bleeding.  For a couple of months, make note on your calendar, marking the start and ending of your period, and the type of bleeding (light, medium, heavy, spotting, clots or missed periods). This is for your caregiver to better evaluate your  problem. SEEK MEDICAL CARE IF:   You develop nausea (feeling sick to your stomach) and vomiting, dizziness, or diarrhea while you are taking your medicine.  You are getting lightheaded or weak.  You have any problems that may be related to the medicine you are taking.  You develop pain with your DUB.  You want to remove your IUD.  You want to stop or change your birth control pills or hormones.  You have any type of abnormal bleeding mentioned above.  You are over 36 years old and have not had a menstrual period yet.  You are 48 years old and you are still having menstrual periods.  You have any of the symptoms mentioned above.  You develop a rash. SEEK IMMEDIATE MEDICAL CARE IF:   An oral temperature above 102 F (38.9 C) develops.  You develop chills.  You are changing your sanitary pad or tampon more than once an hour.  You develop abdominal pain.  You pass out or faint. Document Released: 10/19/2000 Document Revised: 01/14/2012 Document Reviewed: 09/20/2009 [facility] Patient Information 2014 [place].

## 2014-04-21 ENCOUNTER — Telehealth: Payer: Self-pay | Admitting: General Practice

## 2014-04-21 NOTE — Telephone Encounter (Signed)
Patient called and left message stating she had a novasure ablation on 4/27 and was told to call us if she started to have bleeding. Yesterday she started to have bleeding and is also having unbearable pain and would like Rx like megace to stop the bleeding and something for the pain as well. Called patient at work number and she states that when she came in for her visit in May she was bleeding then and was told to continue taking the provera and if she is still having a problem with bleeding, we might need to put her on megace. Patient also states that she started light bleeding yesterday but now it has picked up and she is bleeding heavier and is having unbearable cramps. Told patient that Dr Ihor Dow is in the clinic this afternoon so I will speak with her and call you back. Patient stated best number was [phone]. Patient had no other questions at this time

## 2014-04-22 ENCOUNTER — Other Ambulatory Visit: Payer: Self-pay | Admitting: Obstetrics & Gynecology

## 2014-04-22 DIAGNOSIS — N920 Excessive and frequent menstruation with regular cycle: Secondary | ICD-10-CM

## 2014-04-22 MED ORDER — MEGESTROL ACETATE 20 MG PO TABS: 40.0000 mg | ORAL_TABLET | Freq: Every day | ORAL | Status: DC

## 2014-04-22 NOTE — Telephone Encounter (Signed)
Called pt and left message that the Rx that she requested is at her pharmacy at [facility] off [address]

## 2014-05-17 ENCOUNTER — Telehealth: Payer: Self-pay | Admitting: *Deleted

## 2014-05-17 NOTE — Telephone Encounter (Signed)
Patient states that she was put on a medicine in April to help with her bleeding. She has been bleeding for 18 days. She wants to know what Dr H-S wants her to do.

## 2014-05-18 NOTE — Telephone Encounter (Signed)
I returned patients call. Left message for her to call us back.

## 2014-05-19 NOTE — Telephone Encounter (Signed)
Called patient stating I am returning her phone call. Patient states that she had the ablation done 4/27 and has been on the provera 20 mg since then but started to bleed June 15 and has not stopped since then. Called Dr Ihor Dow who states the next step would be possible surgery and that she can call the patient tomorrow if she would like. Called patient back and stated that it sounds like the ablation has not helped her but that if she would like Dr Ihor Dow can call her tomorrow. Patient verbalized understanding and states she would like for her to call her tomorrow. Told patient I would let Dr Ihor Dow know. Patient had no other questions

## 2014-05-20 ENCOUNTER — Telehealth: Payer: Self-pay | Admitting: Obstetrics & Gynecology

## 2014-05-20 NOTE — Telephone Encounter (Signed)
Pt reports that she is continuing to bleed despite being on Provera.  She has attempted an ablation and meds with no relief.  Patient desires surgical management with total vaginal hysterectomy with bilateral salpingectomy.  The risks of surgery were discussed in detail with the patient including but not limited to: bleeding which may require transfusion or reoperation; infection which may require prolonged hospitalization or re-hospitalization and antibiotic therapy; injury to bowel, bladder, ureters and major vessels or other surrounding organs; need for additional procedures including laparotomy; thromboembolic phenomenon, incisional problems and other postoperative or anesthesia complications.  Patient was told that the likelihood that her condition and symptoms will be treated effectively with this surgical management was very high; the postoperative expectations were also discussed in detail. The patient also understands the alternative treatment options which were discussed in full. All questions were answered.  She was told that she will be contacted by our surgical scheduler regarding the time and date of her surgery; routine preoperative instructions of having nothing to eat or drink after midnight on the day prior to surgery and also coming to the hospital 1 1/2 hours prior to her time of surgery were also emphasized.  She was told she may be called for a preoperative appointment about a week prior to surgery and will be given further preoperative instructions at that visit. Printed patient education handouts about the procedure were given to the patient to review at home.

## 2014-06-14 ENCOUNTER — Other Ambulatory Visit: Payer: Self-pay | Admitting: Orthopedic Surgery

## 2014-06-18 ENCOUNTER — Telehealth: Payer: Self-pay | Admitting: General Practice

## 2014-06-18 NOTE — Telephone Encounter (Signed)
Called patient, no answer. Unable to leave message due to voicemail box full.

## 2014-06-18 NOTE — Telephone Encounter (Signed)
Message copied by Shelly Coss on Fri Jun 18, 2014  8:14 AM ------      Message from: Lavonia Drafts      Created: Thu Jun 17, 2014  3:02 PM      Regarding: FW: Cancelled TVH       Pt needed to cancel surgery due to new job.  Please ask her about her bleeding and if she needs additional meds or refills.            THx,            clh -S       ----- Message -----         From: [place] L Presnell         Sent: 06/16/2014  10:23 AM           To: Guss Bunde, MD, #      Subject: Cancelled TVH                                            Ms. Venuti called 06/16/14 to cancel TVH/Salpingectomy due to starting a new job and does not have the time.  Will f/u in GYN clinic first of year to reschedule       ------

## 2014-06-21 NOTE — Telephone Encounter (Signed)
Called patient and she states that she had to postpone the surgery till the first of the year because she couldn't take the needed time off at this point with her job but would be able to at the first of the year. Patient also states that her bleeding has stopped for the past two weeks so does not need refills at this time but knows to call us if something changes. Patient had no questions

## 2014-06-22 ENCOUNTER — Other Ambulatory Visit: Payer: Self-pay | Admitting: Obstetrics & Gynecology

## 2014-06-22 ENCOUNTER — Encounter (HOSPITAL_BASED_OUTPATIENT_CLINIC_OR_DEPARTMENT_OTHER): Payer: Self-pay | Admitting: *Deleted

## 2014-06-22 DIAGNOSIS — N920 Excessive and frequent menstruation with regular cycle: Secondary | ICD-10-CM

## 2014-06-22 MED ORDER — MEGESTROL ACETATE 20 MG PO TABS: 40.0000 mg | ORAL_TABLET | Freq: Every day | ORAL | Status: DC

## 2014-06-22 NOTE — Progress Notes (Signed)
Called patient and informed her of refill at pharmacy should she need the medication. Patient verbalized understanding and states that she has had a constant pain around her right ovary kind of like what you would get around a monthly cycle. Recommended patient take OTC ibuprofen 600mg  as need every 8 hours. Patient verbalized understanding and had no other questions

## 2014-06-22 NOTE — Progress Notes (Signed)
No labs needed

## 2014-06-25 ENCOUNTER — Encounter (HOSPITAL_BASED_OUTPATIENT_CLINIC_OR_DEPARTMENT_OTHER)
Admission: RE | Disposition: A | Discharge status: Home / Self Care | Payer: Self-pay | Source: Ambulatory Visit | Attending: Orthopedic Surgery

## 2014-06-25 ENCOUNTER — Ambulatory Visit (HOSPITAL_BASED_OUTPATIENT_CLINIC_OR_DEPARTMENT_OTHER)
Admission: RE | Admit: 2014-06-25 | Discharge: 2014-06-25 | Disposition: A | Discharge status: Home / Self Care | Payer: Managed Care, Other (non HMO) | Source: Ambulatory Visit | Attending: Orthopedic Surgery | Admitting: Orthopedic Surgery

## 2014-06-25 ENCOUNTER — Ambulatory Visit (HOSPITAL_BASED_OUTPATIENT_CLINIC_OR_DEPARTMENT_OTHER): Payer: Managed Care, Other (non HMO) | Admitting: Anesthesiology

## 2014-06-25 ENCOUNTER — Encounter (HOSPITAL_BASED_OUTPATIENT_CLINIC_OR_DEPARTMENT_OTHER): Payer: Managed Care, Other (non HMO) | Admitting: Anesthesiology

## 2014-06-25 ENCOUNTER — Encounter (HOSPITAL_BASED_OUTPATIENT_CLINIC_OR_DEPARTMENT_OTHER): Payer: Self-pay | Admitting: *Deleted

## 2014-06-25 DIAGNOSIS — D649 Anemia, unspecified: Secondary | ICD-10-CM | POA: Insufficient documentation

## 2014-06-25 DIAGNOSIS — Z79899 Other long term (current) drug therapy: Secondary | ICD-10-CM | POA: Insufficient documentation

## 2014-06-25 DIAGNOSIS — G5600 Carpal tunnel syndrome, unspecified upper limb: Secondary | ICD-10-CM | POA: Insufficient documentation

## 2014-06-25 HISTORY — PX: CARPAL TUNNEL RELEASE: SHX101

## 2014-06-25 LAB — POCT HEMOGLOBIN-HEMACUE: Hemoglobin: 10.1 g/dL — ABNORMAL LOW (ref 12.0–15.0)

## 2014-06-25 SURGERY — CARPAL TUNNEL RELEASE
Anesthesia: Regional | Site: Wrist | Laterality: Left

## 2014-06-25 MED ORDER — CEFAZOLIN SODIUM-DEXTROSE 2-3 GM-% IV SOLR
INTRAVENOUS | Status: AC
  Filled 2014-06-25: qty 50

## 2014-06-25 MED ORDER — LACTATED RINGERS IV SOLN
INTRAVENOUS | Status: DC
  Administered 2014-06-25: 13:00:00 via INTRAVENOUS

## 2014-06-25 MED ORDER — OXYCODONE HCL 5 MG/5ML PO SOLN: 5.0000 mg | Freq: Once | ORAL | Status: AC | PRN

## 2014-06-25 MED ORDER — CHLORHEXIDINE GLUCONATE 4 % EX LIQD: 60.0000 mL | Freq: Once | CUTANEOUS | Status: DC

## 2014-06-25 MED ORDER — OXYCODONE HCL 5 MG PO TABS
ORAL_TABLET | ORAL | Status: AC
  Filled 2014-06-25: qty 1

## 2014-06-25 MED ORDER — BUPIVACAINE HCL (PF) 0.25 % IJ SOLN
INTRAMUSCULAR | Status: DC | PRN
  Administered 2014-06-25: 8 mL

## 2014-06-25 MED ORDER — CEFAZOLIN SODIUM-DEXTROSE 2-3 GM-% IV SOLR: 2.0000 g | INTRAVENOUS | Status: DC

## 2014-06-25 MED ORDER — FENTANYL CITRATE 0.05 MG/ML IJ SOLN: 50.0000 ug | INTRAMUSCULAR | Status: DC | PRN

## 2014-06-25 MED ORDER — FENTANYL CITRATE 0.05 MG/ML IJ SOLN: 25.0000 ug | INTRAMUSCULAR | Status: DC | PRN

## 2014-06-25 MED ORDER — FENTANYL CITRATE 0.05 MG/ML IJ SOLN
INTRAMUSCULAR | Status: AC
  Filled 2014-06-25: qty 4

## 2014-06-25 MED ORDER — PROPOFOL INFUSION 10 MG/ML OPTIME
INTRAVENOUS | Status: DC | PRN
  Administered 2014-06-25: 100 ug/kg/min via INTRAVENOUS

## 2014-06-25 MED ORDER — KETOROLAC TROMETHAMINE 30 MG/ML IJ SOLN: 15.0000 mg | Freq: Once | INTRAMUSCULAR | Status: DC | PRN

## 2014-06-25 MED ORDER — MIDAZOLAM HCL 2 MG/2ML IJ SOLN
INTRAMUSCULAR | Status: AC
  Filled 2014-06-25: qty 2

## 2014-06-25 MED ORDER — OXYCODONE HCL 5 MG PO TABS
5.0000 mg | ORAL_TABLET | Freq: Once | ORAL | Status: AC | PRN
  Administered 2014-06-25: 5 mg via ORAL

## 2014-06-25 MED ORDER — MIDAZOLAM HCL 5 MG/5ML IJ SOLN
INTRAMUSCULAR | Status: DC | PRN
  Administered 2014-06-25 (×2): 1 mg via INTRAVENOUS

## 2014-06-25 MED ORDER — ACETAMINOPHEN 325 MG PO TABS: 325.0000 mg | ORAL_TABLET | ORAL | Status: DC | PRN

## 2014-06-25 MED ORDER — MIDAZOLAM HCL 2 MG/2ML IJ SOLN: 1.0000 mg | INTRAMUSCULAR | Status: DC | PRN

## 2014-06-25 MED ORDER — HYDROCODONE-ACETAMINOPHEN 5-325 MG PO TABS: 1.0000 | ORAL_TABLET | Freq: Four times a day (QID) | ORAL | Status: DC | PRN

## 2014-06-25 MED ORDER — FENTANYL CITRATE 0.05 MG/ML IJ SOLN
INTRAMUSCULAR | Status: DC | PRN
  Administered 2014-06-25 (×2): 50 ug via INTRAVENOUS

## 2014-06-25 MED ORDER — ACETAMINOPHEN 160 MG/5ML PO SOLN
325.0000 mg | ORAL | Status: DC | PRN
Start: 2014-06-25 — End: 2014-06-25

## 2014-06-25 MED ORDER — CEFAZOLIN SODIUM-DEXTROSE 2-3 GM-% IV SOLR
2.0000 g | INTRAVENOUS | Status: AC
  Administered 2014-06-25: 2 g via INTRAVENOUS

## 2014-06-25 SURGICAL SUPPLY — 37 items
BLADE SURG 15 STRL LF DISP TIS (BLADE) ×1 IMPLANT
BLADE SURG 15 STRL SS (BLADE) ×3
BNDG CMPR 9X4 STRL LF SNTH (GAUZE/BANDAGES/DRESSINGS)
BNDG COHESIVE 3X5 TAN STRL LF (GAUZE/BANDAGES/DRESSINGS) ×3 IMPLANT
BNDG ESMARK 4X9 LF (GAUZE/BANDAGES/DRESSINGS) IMPLANT
BNDG GAUZE ELAST 4 BULKY (GAUZE/BANDAGES/DRESSINGS) ×3 IMPLANT
CHLORAPREP W/TINT 26ML (MISCELLANEOUS) ×3 IMPLANT
CORDS BIPOLAR (ELECTRODE) ×3 IMPLANT
COVER MAYO STAND STRL (DRAPES) ×3 IMPLANT
COVER TABLE BACK 60X90 (DRAPES) ×3 IMPLANT
CUFF TOURNIQUET SINGLE 18IN (TOURNIQUET CUFF) ×3 IMPLANT
DRAPE EXTREMITY T 121X128X90 (DRAPE) ×3 IMPLANT
DRAPE SURG 17X23 STRL (DRAPES) ×3 IMPLANT
DRSG PAD ABDOMINAL 8X10 ST (GAUZE/BANDAGES/DRESSINGS) ×3 IMPLANT
GAUZE SPONGE 4X4 12PLY STRL (GAUZE/BANDAGES/DRESSINGS) ×3 IMPLANT
GAUZE XEROFORM 1X8 LF (GAUZE/BANDAGES/DRESSINGS) ×3 IMPLANT
GLOVE BIO SURGEON STRL SZ 6.5 (GLOVE) ×1 IMPLANT
GLOVE BIO SURGEONS STRL SZ 6.5 (GLOVE) ×1
GLOVE BIOGEL M 7.0 STRL (GLOVE) ×2 IMPLANT
GLOVE BIOGEL PI IND STRL 7.0 (GLOVE) IMPLANT
GLOVE BIOGEL PI IND STRL 8.5 (GLOVE) ×1 IMPLANT
GLOVE BIOGEL PI INDICATOR 7.0 (GLOVE) ×2
GLOVE BIOGEL PI INDICATOR 8.5 (GLOVE) ×2
GLOVE SURG ORTHO 8.0 STRL STRW (GLOVE) ×3 IMPLANT
GOWN STRL REUS W/ TWL LRG LVL3 (GOWN DISPOSABLE) ×1 IMPLANT
GOWN STRL REUS W/TWL LRG LVL3 (GOWN DISPOSABLE) ×3
GOWN STRL REUS W/TWL XL LVL3 (GOWN DISPOSABLE) ×3 IMPLANT
NEEDLE 27GAX1X1/2 (NEEDLE) ×2 IMPLANT
NS IRRIG 1000ML POUR BTL (IV SOLUTION) ×3 IMPLANT
PACK BASIN DAY SURGERY FS (CUSTOM PROCEDURE TRAY) ×3 IMPLANT
STOCKINETTE 4X48 STRL (DRAPES) ×3 IMPLANT
SUT VICRYL 4-0 PS2 18IN ABS (SUTURE) IMPLANT
SUT VICRYL RAPIDE 4/0 PS 2 (SUTURE) ×3 IMPLANT
SYR BULB 3OZ (MISCELLANEOUS) ×3 IMPLANT
SYR CONTROL 10ML LL (SYRINGE) ×2 IMPLANT
TOWEL OR 17X24 6PK STRL BLUE (TOWEL DISPOSABLE) ×3 IMPLANT
UNDERPAD 30X30 INCONTINENT (UNDERPADS AND DIAPERS) ×3 IMPLANT

## 2014-06-25 NOTE — Anesthesia Postprocedure Evaluation (Signed)
  Anesthesia Post-op Note  Patient: Lori Green  Procedure(s) Performed: Procedure(s): LEFT CARPAL TUNNEL RELEASE (Left)  Patient Location: PACU  Anesthesia Type:Bier block  Level of Consciousness: awake and alert   Airway and Oxygen Therapy: Patient Spontanous Breathing  Post-op Pain: mild  Post-op Assessment: Post-op Vital signs reviewed, Patient's Cardiovascular Status Stable, Respiratory Function Stable, Patent Airway, No signs of Nausea or vomiting and Pain level controlled  Post-op Vital Signs: Reviewed and stable  Last Vitals:  Filed Vitals:   06/25/14 1457  BP:   Pulse:   Temp: 36.8 C  Resp:     Complications: No apparent anesthesia complications

## 2014-06-25 NOTE — H&P (Signed)
Lori Green is a 48 year-old right-hand dominant female who comes in complaining of bilateral hand pain, left greater than right, for the past approximately three months.  She states she has fairly constant numbness and tingling median nerve distribution on her left side.  Her right side is lagging slightly behind, it awakens her 3 to 4 out of 7 nights.  She has no history of injury to the hand or to the neck.  She has tried wearing splints, over-the-counter medications without significant relief.  She states the splints have given her mild relief.  She says it is much worse at night.  She has no history of diabetes, thyroid problems, arthritis or gout. She complains of intermittent, severe, aching, burning, throbbing pain with a feeling of numbness and weakness.  She has had her nerve conductions done by Dr. Zebedee Green. This reveals bilateral carpal tunnel syndrome with a motor delay of 7.1 on the left, 5.2 on the right, sensory delay of 5.3 on the left, 3.9 on the right, amplitude diminution 2.4 on the left 5.7 on the right.  ALLERGIES:   None.  MEDICATIONS:     None.  SURGICAL HISTORY:    Foot surgery.   FAMILY MEDICAL HISTORY:     Negative.  SOCIAL HISTORY:     She does not smoke or drink. 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  REVIEW OF SYSTEMS:   Negative 14 points. Lori Green is an 48 y.o. female.   Chief Complaint: CTS left HPI: see above  Past Medical History  Diagnosis Date  . Menorrhagia   . Bartholin gland cyst   . Hypertension     no meds-wt loss helped HTN    Past Surgical History  Procedure Laterality Date  . Bunionectomy  06/17/12    lt  . Breast reduction surgery  04/2003  . Tubal ligation  01/1994  . Hysteroscopy with novasure N/A 03/01/2014    Procedure: HYSTEROSCOPY WITH NOVASURE;  Surgeon: Lori Drafts, MD;  Location: [facility];  Service: Gynecology;  Laterality: N/A;    Family History  Problem Relation Age of Onset  . Heart  disease Father   . Stroke Father   . Diabetes Son    Social History:  reports that she has never smoked. She has never used smokeless tobacco. She reports that she does not drink alcohol or use illicit drugs.  Allergies: No Known Allergies  Medications Prior to Admission  Medication Sig Dispense Refill  . Linaclotide (LINZESS) 290 MCG CAPS capsule Take 290 mcg by mouth as needed (constipation).       . medroxyPROGESTERone (PROVERA) 10 MG tablet Take 2 tablets (20 mg total) by mouth daily.  30 tablet  2  . megestrol (MEGACE) 20 MG tablet Take 40 mg by mouth as needed.        No results found for this or any previous visit (from the past 48 hour(s)).  No results found.   Pertinent items are noted in HPI.  Blood pressure 138/82, pulse 74, temperature 98.6 F (37 C), temperature source Oral, resp. rate 16, height 5\' 4"  (1.626 m), weight 97.07 kg (214 lb), last menstrual period 04/19/2014, SpO2 100.00%.  General appearance: alert, cooperative and appears stated age Head: Normocephalic, without obvious abnormality Neck: no JVD Resp: clear to auscultation bilaterally Cardio: regular rate and rhythm, S1, S2 normal, no murmur, click, rub or gallop GI: soft, non-tender; bowel sounds normal; no masses,  no organomegaly Extremities: extremities normal, atraumatic, no  cyanosis or edema Pulses: 2+ and symmetric Skin: Skin color, texture, turgor normal. No rashes or lesions Neurologic: Grossly normal Incision/Wound: na  Assessment/Plan RADIOGRAPHS:    X-rays reveal mild arthritic changes at the DIP joint of her thumb, probably early arthritic changes at the carpometacarpal joints of her thumb, otherwise negative.    DIAGNOSIS:     Degenerative arthritis, asymptomatic with  carpal tunnel syndrome bilaterally. We have discussed the possibility of surgical decompression. The pre, peri and post op course are discussed along with risks and complications. She is aware there is no guarantee with  surgery, possibility of infection, recurrence, injury to arteries, nerves and tendons, incomplete relief of symptoms and dystrophy.  She would like to proceed to have the left side done. She does not want to have bilateral releases. Questions were invited and answered in detail. She is scheduled for left carpal tunnel release as an outpatient under regional anesthesia.  Lori Green R 06/25/2014, 12:31 PM

## 2014-06-25 NOTE — Brief Op Note (Signed)
06/25/2014  2:15 PM  PATIENT:  Harriett P Moxley  48 y.o. female  PRE-OPERATIVE DIAGNOSIS:  LEFT CARPAL TUNNEL SYNDROME   POST-OPERATIVE DIAGNOSIS:  LEFT CARPAL TUNNEL SYNDROME   PROCEDURE:  Procedure(s): LEFT CARPAL TUNNEL RELEASE (Left)  SURGEON:  Surgeon(s) and Role:    * Daryll Brod, MD - Primary  PHYSICIAN ASSISTANT:   ASSISTANTS: none   ANESTHESIA:   local and regional  EBL:     BLOOD ADMINISTERED:none  DRAINS: none   LOCAL MEDICATIONS USED:  BUPIVICAINE   SPECIMEN:  No Specimen  DISPOSITION OF SPECIMEN:  N/A  COUNTS:  YES  TOURNIQUET:   Total Tourniquet Time Documented: Forearm (Left) - 17 minutes Total: Forearm (Left) - 17 minutes   DICTATION: .Other Dictation: Dictation Number [id number]  PLAN OF CARE: Discharge to home after PACU  PATIENT DISPOSITION:  PACU - hemodynamically stable.

## 2014-06-25 NOTE — Transfer of Care (Signed)
Immediate Anesthesia Transfer of Care Note  Patient: Lori Green  Procedure(s) Performed: Procedure(s): LEFT CARPAL TUNNEL RELEASE (Left)  Patient Location: PACU  Anesthesia Type:Bier block  Level of Consciousness: awake, oriented and patient cooperative  Airway & Oxygen Therapy: Patient Spontanous Breathing and Patient connected to face mask oxygen  Post-op Assessment: Report given to PACU RN and Post -op Vital signs reviewed and stable  Post vital signs: Reviewed and stable  Complications: No apparent anesthesia complications

## 2014-06-25 NOTE — Op Note (Signed)
Lori Green, Lori Green               ACCOUNT NO.:  [id number]  MEDICAL RECORD NO.:  [id number]  LOCATION:                                 FACILITY:  PHYSICIAN:  Daryll Brod, M.D.       DATE OF BIRTH:  07-08-1966  DATE OF PROCEDURE:  06/25/2014 DATE OF DISCHARGE:                              OPERATIVE REPORT   PREOPERATIVE DIAGNOSIS:  Carpal tunnel syndrome, left hand.  POSTOPERATIVE DIAGNOSIS:  Carpal tunnel syndrome, left hand.  OPERATION:  Decompression of left median nerve.  SURGEON:  Daryll Brod, M.D.  ANESTHESIA:  Forearm-based IV regional with local infiltration.  HISTORY:  The patient is a 48 year old female with a history of carpal tunnel syndrome, nerve conduction is positive, which has not responded to conservative treatment.  She has elected to undergo surgical decompression.  Pre, peri, postoperative course have been discussed along with risks and complications.  She is aware that there is no guarantee with the surgery; possibility of infection; recurrence of injury to arteries, nerves, tendons; incomplete relief of symptoms; dystrophy.  In the preoperative area, the patient was seen, the extremity marked by both the patient and surgeon.  Antibiotic given.  PROCEDURE IN DETAIL:  The patient was brought to the operating room and forearm-based IV regional anesthetic was carried out without difficulty. She was prepped using ChloraPrep in supine position with the left arm free.  A 3-minute dry time was allowed.  Time-out taken, confirming the patient and procedure.  A longitudinal incision was made in the left palm, carried down through subcutaneous tissue.  Bleeders were electrocauterized.  Palmar fascia was split.  Superficial palmar arch identified.  The flexor tendon to the ring, little finger identified. To the ulnar side of the median nerve, the carpal retinaculum was incised with sharp dissection.  The wound was explored.  Right angle and Sewall retractor were  placed between skin and forearm fascia and the fascia released for approximately 2 cm proximal to the wrist crease under direct vision.  On inspection of the canal, air compression to the nerve was apparent.  Motor branch entered into muscle.  No further lesions were identified.  The wound was irrigated.  The skin was closed with interrupted 4-0 Vicryl Rapide sutures.  Local infiltration with 0.25% Marcaine without epinephrine was given, approximately 8 mL was used.  Sterile compressive dressing with the fingers free was applied.  On deflation of the tourniquet, all fingers immediately pinked.  She was taken to the recovery room for observation in satisfactory condition.  She will be discharged home to return to the [facility] in 1 week on Norco.          ______________________________ Daryll Brod, M.D.     GK/MEDQ  D:  06/25/2014  T:  06/25/2014  Job:  [id number]

## 2014-06-25 NOTE — Discharge Instructions (Addendum)

## 2014-06-25 NOTE — Op Note (Signed)
Dictation Number [id number]

## 2014-06-25 NOTE — Anesthesia Preprocedure Evaluation (Signed)
Anesthesia Evaluation  Patient identified by MRN, date of birth, ID band Patient awake    Reviewed: Allergy & Precautions, H&P , NPO status , Patient's Chart, lab work & pertinent test results  History of Anesthesia Complications Negative for: history of anesthetic complications  Airway Mallampati: I TM Distance: >3 FB Neck ROM: Full    Dental  (+) Teeth Intact   Pulmonary neg pulmonary ROS,  breath sounds clear to auscultation        Cardiovascular hypertension, Rhythm:Regular     Neuro/Psych negative neurological ROS  negative psych ROS   GI/Hepatic negative GI ROS, Neg liver ROS,   Endo/Other  Morbid obesity  Renal/GU negative Renal ROS     Musculoskeletal Left CTS   Abdominal   Peds  Hematology  (+) anemia ,   Anesthesia Other Findings   Reproductive/Obstetrics                           Anesthesia Physical Anesthesia Plan  ASA: II  Anesthesia Plan: Bier Block   Post-op Pain Management:    Induction:   Airway Management Planned: Natural Airway  Additional Equipment: None  Intra-op Plan:   Post-operative Plan:   Informed Consent: I have reviewed the patients History and Physical, chart, labs and discussed the procedure including the risks, benefits and alternatives for the proposed anesthesia with the patient or authorized representative who has indicated his/her understanding and acceptance.   Dental advisory given  Plan Discussed with: CRNA and Surgeon  Anesthesia Plan Comments:         Anesthesia Quick Evaluation

## 2014-06-28 ENCOUNTER — Encounter (HOSPITAL_BASED_OUTPATIENT_CLINIC_OR_DEPARTMENT_OTHER): Payer: Self-pay | Admitting: Orthopedic Surgery

## 2014-07-06 ENCOUNTER — Ambulatory Visit (HOSPITAL_COMMUNITY): Admit: 2014-07-06 | Payer: Managed Care, Other (non HMO) | Admitting: Obstetrics & Gynecology

## 2014-07-06 ENCOUNTER — Encounter (HOSPITAL_COMMUNITY): Payer: Self-pay

## 2014-07-06 SURGERY — HYSTERECTOMY, VAGINAL
Anesthesia: Choice | Laterality: Bilateral

## 2014-08-20 ENCOUNTER — Other Ambulatory Visit: Payer: Self-pay

## 2014-09-02 ENCOUNTER — Other Ambulatory Visit: Payer: Self-pay

## 2014-09-02 DIAGNOSIS — Z9889 Other specified postprocedural states: Secondary | ICD-10-CM

## 2014-09-02 DIAGNOSIS — Z1231 Encounter for screening mammogram for malignant neoplasm of breast: Secondary | ICD-10-CM

## 2014-09-06 ENCOUNTER — Encounter (HOSPITAL_BASED_OUTPATIENT_CLINIC_OR_DEPARTMENT_OTHER): Payer: Self-pay | Admitting: Orthopedic Surgery

## 2014-09-14 ENCOUNTER — Ambulatory Visit: Payer: Managed Care, Other (non HMO)

## 2014-09-28 ENCOUNTER — Ambulatory Visit: Payer: Managed Care, Other (non HMO)

## 2015-03-09 ENCOUNTER — Ambulatory Visit: Payer: Managed Care, Other (non HMO) | Admitting: Obstetrics & Gynecology

## 2015-03-24 ENCOUNTER — Ambulatory Visit: Payer: Managed Care, Other (non HMO) | Admitting: Obstetrics & Gynecology

## 2015-03-25 ENCOUNTER — Ambulatory Visit (INDEPENDENT_AMBULATORY_CARE_PROVIDER_SITE_OTHER): Payer: Managed Care, Other (non HMO) | Admitting: Obstetrics & Gynecology

## 2015-03-25 ENCOUNTER — Encounter: Payer: Self-pay | Admitting: Obstetrics & Gynecology

## 2015-03-25 VITALS — BP 132/67 | HR 75 | Temp 98.2°F | Ht 64.0 in | Wt 216.4 lb

## 2015-03-25 DIAGNOSIS — N809 Endometriosis, unspecified: Secondary | ICD-10-CM

## 2015-03-25 DIAGNOSIS — N8003 Adenomyosis of the uterus: Secondary | ICD-10-CM

## 2015-03-25 DIAGNOSIS — R102 Pelvic and perineal pain: Secondary | ICD-10-CM

## 2015-03-25 DIAGNOSIS — N800 Endometriosis of uterus: Secondary | ICD-10-CM | POA: Diagnosis not present

## 2015-03-25 MED ORDER — HYDROCODONE-ACETAMINOPHEN 5-300 MG PO TABS: 1.0000 | ORAL_TABLET | Freq: Three times a day (TID) | ORAL | Status: DC | PRN

## 2015-03-25 MED ORDER — DICLOFENAC POTASSIUM 50 MG PO TABS: 50.0000 mg | ORAL_TABLET | Freq: Three times a day (TID) | ORAL | Status: DC | PRN

## 2015-03-25 MED ORDER — MEGESTROL ACETATE 40 MG PO TABS: 40.0000 mg | ORAL_TABLET | Freq: Two times a day (BID) | ORAL | Status: DC

## 2015-03-25 NOTE — Progress Notes (Signed)
Patient ID: KATRICIA PREHN, female   DOB: 06-02-1966, 49 y.o.   MRN: [id number] CLINIC ENCOUNTER NOTE  History:  49 y.o. [id number] here today for eval of pelvic pain.  She  Reports that she had no cycles from Nov to Mar after ablation. On Mar 18th pt c/o pain and passage of 'chocolate syrup'.  She reports passage of this discharge for several weeks.  She reports that the apin is 8/10 intermittently.  In Mar it was 8/10 for 10-15 days.  She takes Aleve with a decrease in the pain and then a return in the pain.    She believes that the pain is worse than the bleeding but, the bleeding is daily and bothersome.  Past Medical History  Diagnosis Date  . Menorrhagia   . Bartholin gland cyst   . Hypertension     no meds-wt loss helped HTN    Past Surgical History  Procedure Laterality Date  . Bunionectomy  06/17/12    lt  . Breast reduction surgery  04/2003  . Tubal ligation  01/1994  . Hysteroscopy with novasure N/A 03/01/2014    Procedure: HYSTEROSCOPY WITH NOVASURE;  Surgeon: Lavonia Drafts, MD;  Location: [facility];  Service: Gynecology;  Laterality: N/A;  . Carpal tunnel release Left 06/25/2014    Procedure: LEFT CARPAL TUNNEL RELEASE;  Surgeon: Daryll Brod, MD;  Location: [facility];  Service: Orthopedics;  Laterality: Left;    The following portions of the patient's history were reviewed and updated as appropriate: allergies, current medications, past family history, past medical history, past social history, past surgical history and problem list.   Health Maintenance:  Normal pap and negative HRHPV in 2013.    Review of Systems:  A comprehensive review of systems was negative. Comprehensive review of systems was otherwise negative.  Objective:  Physical Exam BP 132/67 mmHg  Pulse 75  Temp(Src) 98.2 F (36.8 C) (Oral)  Ht 5\' 4"  (1.626 m)  Wt 216 lb 6.4 oz (98.158 kg)  BMI 37.13 kg/m2  LMP 03/24/2015 CONSTITUTIONAL: Well-developed, well-nourished female in no  acute distress.  HENT:  Normocephalic, atraumatic, External right and left ear normal. Oropharynx is clear and moist EYES: Conjunctivae and EOM are normal. Pupils are equal, round, and reactive to light. No scleral icterus.  NECK: Normal range of motion, supple, no masses SKIN: Skin is warm and dry. No rash noted. Not diaphoretic. No erythema. No pallor. [place]: Alert and oriented to person, place, and time. Normal reflexes, muscle tone coordination. No cranial nerve deficit noted. PSYCHIATRIC: Normal mood and affect. Normal behavior. Normal judgment and thought content. CARDIOVASCULAR: Normal heart rate noted RESPIRATORY: Effort and breath sounds normal, no problems with respiration noted ABDOMEN: Soft, no distention noted.  No tenderness, rebound or guarding.  PELVIC: Normal appearing external genitalia; + blood in vault.  Normal uterine size, no other palpable masses, + uterine tenderness. No adnexal masses palpated   Labs and Imaging No results found.  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  Wants to try Megace and see if that works first Megace 40mg  bid (may decrease to 40mg  per day if sx imrpove after 1 month) Pelvic sono F/u in 4 months or sooner prn  Routine preventative health maintenance measures emphasized. Total face-to-face time with patient: 20 minutes. Over 50% of encounter was spent on counseling and coordination of care.   Chue Berkovich L. Ihor Dow, MD, [facility] Attending Obstetrician & Gynecologist  [facility] [place]

## 2015-04-01 ENCOUNTER — Ambulatory Visit (HOSPITAL_COMMUNITY)
Admission: RE | Admit: 2015-04-01 | Discharge: 2015-04-01 | Disposition: A | Discharge status: Home / Self Care | Payer: Managed Care, Other (non HMO) | Source: Ambulatory Visit | Attending: Obstetrics & Gynecology | Admitting: Obstetrics & Gynecology

## 2015-04-01 DIAGNOSIS — R102 Pelvic and perineal pain: Secondary | ICD-10-CM | POA: Insufficient documentation

## 2015-04-07 ENCOUNTER — Telehealth: Payer: Self-pay | Admitting: Obstetrics & Gynecology

## 2015-04-07 NOTE — Telephone Encounter (Signed)
TC to pt to review results of sono and possible polyp vs fibroid.  Pt with a h/o ablation but not able to miss work for a [employer].  Pt was offered a repeat hysteroscopy with possible resection of polyp or fibroid.  She wishes to persue this couse of action.  She is aware that after an ablation we may not be able to distend the endometrium but, she wants to try.  Patient desires surgical management with hysteroscopy with resection of polyp or fibroid.  The risks of surgery were discussed in detail with the patient including but not limited to: bleeding which may require transfusion or reoperation; infection which may require prolonged hospitalization or re-hospitalization and antibiotic therapy; injury to bowel, bladder, ureters and major vessels or other surrounding organs; need for additional procedures including laparotomy; thromboembolic phenomenon,uterine perforation and other postoperative or anesthesia complications.  Patient was told that the likelihood that her condition and symptoms will be treated effectively with this surgical management was very high; the postoperative expectations were also discussed in detail. The patient also understands the alternative treatment options which were discussed in full. All questions were answered.  She was told that she will be contacted by our surgical scheduler regarding the time and date of her surgery; routine preoperative instructions of having nothing to eat or drink after midnight on the day prior to surgery and also coming to the hospital 1 1/2 hours prior to her time of surgery were also emphasized.  She was told she may be called for a preoperative appointment about a week prior to surgery and will be given further preoperative instructions at that visit. Printed patient education handouts about the procedure were given to the patient to review at home.

## 2015-04-08 ENCOUNTER — Encounter (HOSPITAL_COMMUNITY): Payer: Self-pay | Admitting: *Deleted

## 2015-04-22 NOTE — Patient Instructions (Addendum)
   Your procedure is scheduled on:  Tuesday, July 5  Enter through the [employer] [facility] at: 6 AM Pick up the phone at the desk and dial [phone] and inform us of your arrival.  Please call this number if you have any problems the morning of surgery: [phone]  Remember: Do not eat or drink after midnight: Monday Take these medicines the morning of surgery with a SIP OF WATER:  NONE  Do not wear jewelry, make-up, or FINGER nail polish No metal in your hair or on your body. Do not wear lotions, powders, perfumes.  You may wear deodorant.  Do not bring valuables to the hospital. Contacts, dentures or bridgework may not be worn into surgery.  Patients discharged on the day of surgery will not be allowed to drive home.  Home with boyfriend Adolm Joseph [phone]

## 2015-04-25 ENCOUNTER — Encounter (HOSPITAL_COMMUNITY): Payer: Self-pay

## 2015-04-25 ENCOUNTER — Encounter (HOSPITAL_COMMUNITY)
Admission: RE | Admit: 2015-04-25 | Discharge: 2015-04-25 | Disposition: A | Discharge status: Home / Self Care | Payer: Managed Care, Other (non HMO) | Source: Ambulatory Visit | Attending: Obstetrics & Gynecology | Admitting: Obstetrics & Gynecology

## 2015-04-25 DIAGNOSIS — Z01818 Encounter for other preprocedural examination: Secondary | ICD-10-CM | POA: Diagnosis present

## 2015-04-25 LAB — CBC
HCT: 32.5 % — ABNORMAL LOW (ref 36.0–46.0)
Hemoglobin: 10.5 g/dL — ABNORMAL LOW (ref 12.0–15.0)
MCH: 26.9 pg (ref 26.0–34.0)
MCHC: 32.3 g/dL (ref 30.0–36.0)
MCV: 83.1 fL (ref 78.0–100.0)
Platelets: 293 10*3/uL (ref 150–400)
RBC: 3.91 MIL/uL (ref 3.87–5.11)
RDW: 15.7 % — AB (ref 11.5–15.5)
WBC: 4.4 10*3/uL (ref 4.0–10.5)

## 2015-05-05 NOTE — Anesthesia Preprocedure Evaluation (Addendum)
Anesthesia Evaluation  Patient identified by MRN, date of birth, ID band Patient awake    Reviewed: Allergy & Precautions, NPO status , Patient's Chart, lab work & pertinent test results, reviewed documented beta blocker date and time   Airway Mallampati: II    Positive for:  Tracheal deviation   Dental  (+) Teeth Intact, Dental Advisory Given   Pulmonary neg pulmonary ROS,  breath sounds clear to auscultation        Cardiovascular hypertension (no meds, weight loss helped), Rhythm:Regular  ECHO 2007 EF 60%   Neuro/Psych negative neurological ROS  negative psych ROS   GI/Hepatic negative GI ROS, Neg liver ROS,   Endo/Other    Renal/GU negative Renal ROS   Menorrhagia        Musculoskeletal   Abdominal (+)  Abdomen: soft.    Peds  Hematology 10/32   Anesthesia Other Findings   Reproductive/Obstetrics                            Anesthesia Physical Anesthesia Plan  ASA: II  Anesthesia Plan: General   Post-op Pain Management:    Induction: Intravenous  Airway Management Planned: LMA and Oral ETT  Additional Equipment:   Intra-op Plan:   Post-operative Plan:   Informed Consent: I have reviewed the patients History and Physical, chart, labs and discussed the procedure including the risks, benefits and alternatives for the proposed anesthesia with the patient or authorized representative who has indicated his/her understanding and acceptance.     Plan Discussed with:   Anesthesia Plan Comments: (Check am labs)       Anesthesia Quick Evaluation

## 2015-05-10 ENCOUNTER — Ambulatory Visit (HOSPITAL_COMMUNITY)
Admission: RE | Admit: 2015-05-10 | Discharge: 2015-05-10 | Disposition: A | Discharge status: Home / Self Care | Payer: Managed Care, Other (non HMO) | Source: Ambulatory Visit | Attending: Obstetrics & Gynecology | Admitting: Obstetrics & Gynecology

## 2015-05-10 ENCOUNTER — Encounter (HOSPITAL_COMMUNITY)
Admission: RE | Disposition: A | Discharge status: Home / Self Care | Payer: Self-pay | Source: Ambulatory Visit | Attending: Obstetrics & Gynecology

## 2015-05-10 ENCOUNTER — Ambulatory Visit (HOSPITAL_COMMUNITY): Payer: Managed Care, Other (non HMO) | Admitting: Anesthesiology

## 2015-05-10 ENCOUNTER — Encounter (HOSPITAL_COMMUNITY): Payer: Self-pay | Admitting: Anesthesiology

## 2015-05-10 DIAGNOSIS — I10 Essential (primary) hypertension: Secondary | ICD-10-CM | POA: Insufficient documentation

## 2015-05-10 DIAGNOSIS — R938 Abnormal findings on diagnostic imaging of other specified body structures: Secondary | ICD-10-CM | POA: Insufficient documentation

## 2015-05-10 DIAGNOSIS — N938 Other specified abnormal uterine and vaginal bleeding: Secondary | ICD-10-CM | POA: Diagnosis not present

## 2015-05-10 DIAGNOSIS — N920 Excessive and frequent menstruation with regular cycle: Secondary | ICD-10-CM | POA: Insufficient documentation

## 2015-05-10 HISTORY — PX: DILATATION & CURETTAGE/HYSTEROSCOPY WITH MYOSURE: SHX6511

## 2015-05-10 LAB — PREGNANCY, URINE: Preg Test, Ur: NEGATIVE

## 2015-05-10 SURGERY — DILATATION & CURETTAGE/HYSTEROSCOPY WITH MYOSURE
Anesthesia: General | Site: Vagina

## 2015-05-10 MED ORDER — CITRIC ACID-SODIUM CITRATE 334-500 MG/5ML PO SOLN
ORAL | Status: AC
  Filled 2015-05-10: qty 15

## 2015-05-10 MED ORDER — PROPOFOL 10 MG/ML IV BOLUS
INTRAVENOUS | Status: DC | PRN
  Administered 2015-05-10: 180 mg via INTRAVENOUS

## 2015-05-10 MED ORDER — BUPIVACAINE HCL (PF) 0.5 % IJ SOLN
INTRAMUSCULAR | Status: DC | PRN
  Administered 2015-05-10: 20 mL

## 2015-05-10 MED ORDER — DEXAMETHASONE SODIUM PHOSPHATE 4 MG/ML IJ SOLN
INTRAMUSCULAR | Status: AC
  Filled 2015-05-10: qty 1

## 2015-05-10 MED ORDER — KETOROLAC TROMETHAMINE 30 MG/ML IJ SOLN
INTRAMUSCULAR | Status: AC
  Filled 2015-05-10: qty 1

## 2015-05-10 MED ORDER — SODIUM CHLORIDE 0.9 % IR SOLN
Status: DC | PRN
  Administered 2015-05-10: 3000 mL

## 2015-05-10 MED ORDER — PROPOFOL 10 MG/ML IV BOLUS
INTRAVENOUS | Status: AC
  Filled 2015-05-10: qty 20

## 2015-05-10 MED ORDER — ONDANSETRON HCL 4 MG/2ML IJ SOLN
INTRAMUSCULAR | Status: DC | PRN
  Administered 2015-05-10: 4 mg via INTRAVENOUS

## 2015-05-10 MED ORDER — CITRIC ACID-SODIUM CITRATE 334-500 MG/5ML PO SOLN
30.0000 mL | Freq: Once | ORAL | Status: AC
  Administered 2015-05-10: 30 mL via ORAL
  Filled 2015-05-10: qty 30

## 2015-05-10 MED ORDER — IBUPROFEN 600 MG PO TABS: 600.0000 mg | ORAL_TABLET | Freq: Four times a day (QID) | ORAL | Status: DC | PRN

## 2015-05-10 MED ORDER — FENTANYL CITRATE (PF) 100 MCG/2ML IJ SOLN
INTRAMUSCULAR | Status: AC
  Filled 2015-05-10: qty 2

## 2015-05-10 MED ORDER — MIDAZOLAM HCL 2 MG/2ML IJ SOLN
INTRAMUSCULAR | Status: AC
Start: 2015-05-10 — End: 2015-05-10
  Filled 2015-05-10: qty 2

## 2015-05-10 MED ORDER — FENTANYL CITRATE (PF) 100 MCG/2ML IJ SOLN
INTRAMUSCULAR | Status: DC | PRN
  Administered 2015-05-10 (×2): 50 ug via INTRAVENOUS

## 2015-05-10 MED ORDER — LACTATED RINGERS IV SOLN
INTRAVENOUS | Status: DC
  Administered 2015-05-10: 07:00:00 via INTRAVENOUS

## 2015-05-10 MED ORDER — FENTANYL CITRATE (PF) 100 MCG/2ML IJ SOLN
INTRAMUSCULAR | Status: AC
  Administered 2015-05-10: 25 ug via INTRAVENOUS
  Filled 2015-05-10: qty 2

## 2015-05-10 MED ORDER — SCOPOLAMINE 1 MG/3DAYS TD PT72
MEDICATED_PATCH | TRANSDERMAL | Status: AC
  Administered 2015-05-10: 1.5 mg via TRANSDERMAL
  Filled 2015-05-10: qty 1

## 2015-05-10 MED ORDER — SCOPOLAMINE 1 MG/3DAYS TD PT72
1.0000 | MEDICATED_PATCH | Freq: Once | TRANSDERMAL | Status: DC
  Administered 2015-05-10: 1.5 mg via TRANSDERMAL

## 2015-05-10 MED ORDER — BUPIVACAINE HCL (PF) 0.5 % IJ SOLN
INTRAMUSCULAR | Status: AC
  Filled 2015-05-10: qty 30

## 2015-05-10 MED ORDER — PROMETHAZINE HCL 25 MG/ML IJ SOLN: 6.2500 mg | INTRAMUSCULAR | Status: DC | PRN

## 2015-05-10 MED ORDER — LIDOCAINE HCL (CARDIAC) 20 MG/ML IV SOLN
INTRAVENOUS | Status: DC | PRN
  Administered 2015-05-10: 30 mg via INTRAVENOUS
  Administered 2015-05-10: 70 mg via INTRAVENOUS

## 2015-05-10 MED ORDER — MIDAZOLAM HCL 2 MG/2ML IJ SOLN
INTRAMUSCULAR | Status: DC | PRN
  Administered 2015-05-10: 2 mg via INTRAVENOUS

## 2015-05-10 MED ORDER — DEXAMETHASONE SODIUM PHOSPHATE 10 MG/ML IJ SOLN
INTRAMUSCULAR | Status: DC | PRN
  Administered 2015-05-10: 4 mg via INTRAVENOUS

## 2015-05-10 MED ORDER — FENTANYL CITRATE (PF) 100 MCG/2ML IJ SOLN
25.0000 ug | INTRAMUSCULAR | Status: DC | PRN
  Administered 2015-05-10: 25 ug via INTRAVENOUS
  Administered 2015-05-10: 50 ug via INTRAVENOUS
  Administered 2015-05-10: 25 ug via INTRAVENOUS

## 2015-05-10 MED ORDER — MEPERIDINE HCL 25 MG/ML IJ SOLN: 6.2500 mg | INTRAMUSCULAR | Status: DC | PRN

## 2015-05-10 MED ORDER — KETOROLAC TROMETHAMINE 30 MG/ML IJ SOLN
INTRAMUSCULAR | Status: DC | PRN
  Administered 2015-05-10: 30 mg via INTRAVENOUS

## 2015-05-10 MED ORDER — KETOROLAC TROMETHAMINE 30 MG/ML IJ SOLN
INTRAMUSCULAR | Status: AC
Start: 2015-05-10 — End: 2015-05-10
  Filled 2015-05-10: qty 1

## 2015-05-10 MED ORDER — LACTATED RINGERS IV SOLN
INTRAVENOUS | Status: DC
  Administered 2015-05-10 (×2): via INTRAVENOUS

## 2015-05-10 SURGICAL SUPPLY — 18 items
CATH ROBINSON RED A/P 16FR (CATHETERS) ×3 IMPLANT
CLOTH BEACON ORANGE TIMEOUT ST (SAFETY) ×3 IMPLANT
CONTAINER PREFILL 10% NBF 60ML (FORM) ×2 IMPLANT
DEVICE MYOSURE CLASSIC (MISCELLANEOUS) IMPLANT
FILTER ARTHROSCOPY CONVERTOR (FILTER) ×3 IMPLANT
GLOVE BIO SURGEON STRL SZ7 (GLOVE) ×3 IMPLANT
GLOVE BIOGEL PI IND STRL 7.0 (GLOVE) ×1 IMPLANT
GLOVE BIOGEL PI INDICATOR 7.0 (GLOVE) ×2
GOWN STRL REUS W/TWL LRG LVL3 (GOWN DISPOSABLE) ×6 IMPLANT
MYOSURE XL FIBROID REM (MISCELLANEOUS)
PACK VAGINAL MINOR WOMEN LF (CUSTOM PROCEDURE TRAY) ×3 IMPLANT
PAD OB MATERNITY 4.3X12.25 (PERSONAL CARE ITEMS) ×3 IMPLANT
SEAL ROD LENS SCOPE MYOSURE (ABLATOR) ×3 IMPLANT
SYSTEM TISS REMOVAL MYSR XL RM (MISCELLANEOUS) IMPLANT
TOWEL OR 17X24 6PK STRL BLUE (TOWEL DISPOSABLE) ×6 IMPLANT
TUBING AQUILEX INFLOW (TUBING) ×3 IMPLANT
TUBING AQUILEX OUTFLOW (TUBING) ×3 IMPLANT
WATER STERILE IRR 1000ML POUR (IV SOLUTION) ×3 IMPLANT

## 2015-05-10 NOTE — Brief Op Note (Signed)
05/10/2015  8:32 AM  PATIENT:  Lori Green  49 y.o. female  PRE-OPERATIVE DIAGNOSIS:   AUB possible polyp or submucosal fibroid POST-OPERATIVE DIAGNOSIS:  AUB  PROCEDURE:  Procedure(s): DILATATION & CURETTAGE/HYSTEROSCOPY WITH MYOSURE (N/A)  SURGEON:  Surgeon(s) and Role:    * Lavonia Drafts, MD - Primary  ANESTHESIA:   general and paracervical block  EBL:  Total I/O In: 800 [I.V.:800] Out: 10 [Urine:10]  BLOOD ADMINISTERED:none  DRAINS: none   LOCAL MEDICATIONS USED:  MARCAINE     SPECIMEN:  Source of Specimen:  endometrial currettings  DISPOSITION OF SPECIMEN:  PATHOLOGY  COUNTS:  YES  TOURNIQUET:  * No tourniquets in log *  DICTATION: .Note written in EPIC  PLAN OF CARE: Discharge to home after PACU  PATIENT DISPOSITION:  PACU - hemodynamically stable.   Delay start of Pharmacological VTE agent (>24hrs) due to surgical blood loss or risk of bleeding: not applicable  Complications: none immediate

## 2015-05-10 NOTE — Op Note (Signed)
05/10/2015  8:32 AM  PATIENT:  Lori Green  49 y.o. female  PRE-OPERATIVE DIAGNOSIS:   AUB possible polyp or submucosal fibroid POST-OPERATIVE DIAGNOSIS:  AUB  PROCEDURE:  Procedure(s): DILATATION & CURETTAGE/HYSTEROSCOPY WITH MYOSURE (N/A)  SURGEON:  Surgeon(s) and Role:    * Lavonia Drafts, MD - Primary  ANESTHESIA:   general and paracervical block  EBL:  Total I/O In: 800 [I.V.:800] Out: 10 [Urine:10]  BLOOD ADMINISTERED:none  DRAINS: none   LOCAL MEDICATIONS USED:  MARCAINE     SPECIMEN:  Source of Specimen:  endometrial currettings  DISPOSITION OF SPECIMEN:  PATHOLOGY  COUNTS:  YES  TOURNIQUET:  * No tourniquets in log *  DICTATION: .Note written in EPIC  PLAN OF CARE: Discharge to home after PACU  PATIENT DISPOSITION:  PACU - hemodynamically stable.   Delay start of Pharmacological VTE agent (>24hrs) due to surgical blood loss or risk of bleeding: not applicable  Complications: none immediate  INDICATIONS: 49 y.o. [id number]  here for scheduled surgery for hysteroscopy with dilation and curettage and possible resection of polyp or fibroid.  Pt with h/o prior endometrial ablation.  Risks of surgery were discussed with the patient including but not limited to: uterine perforation, bleeding which may require transfusion; infection which may require antibiotics; injury to uterus or surrounding organs; intrauterine scarring which may impair future fertility; need for additional procedures including laparotomy or laparoscopy; and other postoperative/anesthesia complications. Written informed consent was obtained.    FINDINGS:  A 8 week size uterus.  Diffuse scarring from prior ablation of endometrium.  Normal ostia bilaterally. No polyp or fibroid identified.   PROCEDURE DETAILS:  The patient was taken to the operating room where general anesthesia was administered with LMA and was found to be adequate.  After an adequate timeout was performed, she was placed  in the dorsal lithotomy position and examined; then prepped and draped in the sterile manner.   Her bladder was catheterized for an unmeasured amount of clear, yellow urine. A speculum was then placed in the patient's vagina and a single tooth tenaculum was applied to the anterior lip of the cervix.   The cervix was sounded to 8cm and dilated manually with metal dilators to accommodate the 5 mm diagnostic hysteroscope.  Once the cervix was dilated, the hysteroscope was inserted under direct visualization using 1.5% glycine as a suspension medium.  The uterine cavity was carefully examined, both ostia were recognized, and scarring of the endometrium was noted.   After further careful visualization of the uterine cavity, the hysteroscope was removed under direct visualization.  A sharp curettage was then performed to obtain a small amount of endometrial curettings.  The hysteroscope was reinserted and the cavity was found to be intact with no lesions or masses appreciated. The tenaculum was removed from the anterior lip of the cervix and the vaginal speculum was removed after noting good hemostasis.  The patient tolerated the procedure well and was taken to the recovery area awake, extubated and in stable condition.  The patient will be discharged to home as per PACU criteria.  Routine postoperative instructions given.

## 2015-05-10 NOTE — Discharge Instructions (Signed)
DISCHARGE INSTRUCTIONS: HYSTEROSCOPY / ENDOMETRIAL ABLATION The following instructions have been prepared to help you care for yourself upon your return home.  May Remove Scop patch on or before 05/12/2015  May take Ibuprofen after 2:00 pm as needed for pain.  May take stool softner while taking narcotic pain medication to prevent constipation.  Drink plenty of water.  Personal hygiene:  Use sanitary pads for vaginal drainage, not tampons.  Shower the day after your procedure.  NO tub baths, pools or Jacuzzis for 2-3 weeks.  Wipe front to back after using the bathroom.  Activity and limitations:  Do NOT drive or operate any equipment for 24 hours. The effects of anesthesia are still present and drowsiness may result.  Do NOT rest in bed all day.  Walking is encouraged.  Walk up and down stairs slowly.  You may resume your normal activity in one to two days or as indicated by your physician.  Sexual activity: NO intercourse for at least 2 weeks after the procedure, or as indicated by your Doctor.  Diet: Eat a light meal as desired this evening. You may resume your usual diet tomorrow.  Return to Work: You may resume your work activities in one to two days or as indicated by Marine scientist.  What to expect after your surgery: Expect to have vaginal bleeding/discharge for 2-3 days and spotting for up to 10 days. It is not unusual to have soreness for up to 1-2 weeks. You may have a slight burning sensation when you urinate for the first day. Mild cramps may continue for a couple of days. You may have a regular period in 2-6 weeks.  Call your doctor for any of the following:  Excessive vaginal bleeding or clotting, saturating and changing one pad every hour.  Inability to urinate 6 hours after discharge from hospital.  Pain not relieved by pain medication.  Fever of 100.4 F or greater.  Unusual vaginal discharge or odor.  [facility] [phone]

## 2015-05-10 NOTE — Anesthesia Postprocedure Evaluation (Addendum)
  Anesthesia Post-op Note  Patient: Lori Green  Procedure(s) Performed: Procedure(s): DILATATION & CURETTAGE/HYSTEROSCOPY WITH MYOSURE (N/A)  Patient Location: PACU  Anesthesia Type:General  Level of Consciousness: awake, alert  and oriented  Airway and Oxygen Therapy: Patient Spontanous Breathing and Patient connected to nasal cannula oxygen  Post-op Pain: mild  Post-op Assessment: Post-op Vital signs reviewed, Patient's Cardiovascular Status Stable, Respiratory Function Stable, Patent Airway and No signs of Nausea or vomiting              Post-op Vital Signs: Reviewed and stable  Last Vitals:  Filed Vitals:   05/10/15 0803  BP: 137/71  Pulse: 85  Temp: 36.1 C  Resp: 12    Complications: No apparent anesthesia complications   Called for Left Chest discomfort.  VSS, obtained 12 lead, normal except for one PVC, no previous tracing to see.  Will continue to follow.  RX with Bicitra, pain better.  Do not think it is cardiac in origin.  Vitals remain stable.  Will continue out patient recovery procedures.

## 2015-05-10 NOTE — Anesthesia Procedure Notes (Signed)
Procedure Name: LMA Insertion Date/Time: 05/10/2015 7:26 AM Performed by: Tobin Chad Pre-anesthesia Checklist: Timeout performed, Emergency Drugs available, Suction available, Patient being monitored and Patient identified Patient Re-evaluated:Patient Re-evaluated prior to inductionOxygen Delivery Method: Circle system utilized and Simple face mask Preoxygenation: Pre-oxygenation with 100% oxygen Intubation Type: IV induction Ventilation: Mask ventilation without difficulty LMA: LMA inserted LMA Size: 4.0 Grade View: Grade II Placement Confirmation: breath sounds checked- equal and bilateral and positive ETCO2 Tube secured with: Tape Dental Injury: Teeth and Oropharynx as per pre-operative assessment

## 2015-05-10 NOTE — H&P (Signed)
Preoperative History and Physical  Lori Green is a 49 y.o. [id number] here for surgical management of AUB.   Proposed surgery: hysteroscopy with resection of polyp or fibroid.   Past Medical History  Diagnosis Date  . Menorrhagia   . Bartholin gland cyst   . SVD (spontaneous vaginal delivery)     x 6   Past Surgical History  Procedure Laterality Date  . Bunionectomy  06/17/12    lt  . Breast reduction surgery  04/2003  . Tubal ligation  01/1994  . Hysteroscopy with novasure N/A 03/01/2014    Procedure: HYSTEROSCOPY WITH NOVASURE;  Surgeon: Lavonia Drafts, MD;  Location: [facility];  Service: Gynecology;  Laterality: N/A;  . Carpal tunnel release Left 06/25/2014    Procedure: LEFT CARPAL TUNNEL RELEASE;  Surgeon: Daryll Brod, MD;  Location: [facility];  Service: Orthopedics;  Laterality: Left;  . Wisdom tooth extraction     OB History    Gravida Para Term Preterm AB TAB SAB Ectopic Multiple Living   6 6 6       6      Patient denies any cervical dysplasia or STIs. Prescriptions prior to admission  Medication Sig Dispense Refill Last Dose  . diclofenac (CATAFLAM) 50 MG tablet Take 1 tablet (50 mg total) by mouth 3 (three) times daily as needed. 90 tablet 3   . Hydrocodone-Acetaminophen 5-300 MG TABS Take 1 tablet by mouth 3 (three) times daily as needed. 45 each 0   . megestrol (MEGACE) 40 MG tablet Take 1 tablet (40 mg total) by mouth 2 (two) times daily. 60 tablet 6     No Known Allergies Social History:   reports that she has never smoked. She has never used smokeless tobacco. She reports that she does not drink alcohol or use illicit drugs. Family History  Problem Relation Age of Onset  . Heart disease Father   . Stroke Father   . Diabetes Son     Review of Systems: Noncontributory  PHYSICAL EXAM: Blood pressure 129/81, pulse 81, temperature 98.4 F (36.9 C), temperature source Oral, resp. rate 16, SpO2 100 %. General appearance - alert, well  appearing, and in no distress Chest - clear to auscultation, no wheezes, rales or rhonchi, symmetric air entry Heart - normal rate and regular rhythm Abdomen - soft, nontender, nondistended, no masses or organomegaly Pelvic - examination not indicated Extremities - peripheral pulses normal, no pedal edema, no clubbing or cyanosis  Labs: Results for orders placed or performed during the hospital encounter of 05/10/15 (from the past 336 hour(s))  Pregnancy, urine   Collection Time: 05/10/15  6:15 AM  Result Value Ref Range   Preg Test, Ur NEGATIVE NEGATIVE    Imaging Studies: 04/01/15 CLINICAL DATA: Left pelvic pain for 1 month  EXAM: TRANSABDOMINAL AND TRANSVAGINAL ULTRASOUND OF PELVIS  TECHNIQUE: Both transabdominal and transvaginal ultrasound examinations of the pelvis were performed. Transabdominal technique was performed for global imaging of the pelvis including uterus, ovaries, adnexal regions, and pelvic cul-de-sac. It was necessary to proceed with endovaginal exam following the transabdominal exam to visualize the endometrium and ovaries.  COMPARISON: 08/22/2012  FINDINGS: Uterus  Measurements: 11.7 x 7.4 x 6.3 cm, anteverted, anteflexed. No fibroids or other mass visualized.  Endometrium  Thickness: Focal posterior uterine body endometrial thickening in measures 1.6 x 1.5 x 0.5 cm, silhouetted by fluid.  Right ovary  Measurements: 2.7 x 1.3 x 1.3 cm. Normal appearance/no adnexal mass.  Left ovary  Measurements: 2.7  x 1.4 x 1.1 cm. Normal appearance/no adnexal mass.  Other findings  No free fluid.  IMPRESSION: Focal posterior uterine body endometrial thickening as seen previously, highly suspicious for a polyp, although hyperplasia/neoplasia could appear similar. Submucosal fibroid is less likely. Hysteroscopy/targeted sampling is recommended.  Assessment: Patient Active Problem List   Diagnosis Date Noted  . Hypertension  09/23/2012  . Obesity 09/23/2012  . Dysfunctional uterine bleeding 09/12/2012    Plan: Patient will undergo surgical management with hysteroscopy with resection of polyp.   The risks of surgery were discussed in detail with the patient including but not limited to: bleeding which may require transfusion or reoperation; infection which may require antibiotics; injury to surrounding organs which may involve bowel, bladder, ureters ; need for additional procedures including laparoscopy or laparotomy; thromboembolic phenomenon, surgical site problems and other postoperative/anesthesia complications. Likelihood of success in alleviating the patient's condition was discussed. Routine postoperative instructions will be reviewed with the patient and her family in detail after surgery.  The patient concurred with the proposed plan, giving informed written consent for the surgery.  Patient has been NPO since last night she will remain NPO for procedure.  Anesthesia and OR aware.  Preoperative prophylactic antibiotics and SCDs ordered on call to the OR.  To OR when ready.  Zakye Baby L. Ihor Dow, M.D., [facility] 05/10/2015 7:14 AM

## 2015-05-10 NOTE — Transfer of Care (Signed)
Immediate Anesthesia Transfer of Care Note  Patient: Lori Green  Procedure(s) Performed: Procedure(s): DILATATION & CURETTAGE/HYSTEROSCOPY WITH MYOSURE (N/A)  Patient Location: PACU  Anesthesia Type:General  Level of Consciousness: awake, sedated and patient cooperative  Airway & Oxygen Therapy: Patient Spontanous Breathing and Patient connected to nasal cannula oxygen  Post-op Assessment: Report given to RN and Post -op Vital signs reviewed and stable  Post vital signs: Reviewed and stable  Last Vitals:  Filed Vitals:   05/10/15 0635  BP: 129/81  Pulse: 81  Temp: 36.9 C  Resp: 16    Complications: No apparent anesthesia complications

## 2015-05-11 ENCOUNTER — Encounter (HOSPITAL_COMMUNITY): Payer: Self-pay | Admitting: Obstetrics & Gynecology

## 2015-05-13 ENCOUNTER — Telehealth: Payer: Self-pay | Admitting: *Deleted

## 2015-05-13 MED ORDER — TRAMADOL HCL 50 MG PO TABS: 50.0000 mg | ORAL_TABLET | Freq: Four times a day (QID) | ORAL | Status: DC | PRN

## 2015-05-13 NOTE — Telephone Encounter (Signed)
Rx called in for tramadol per [place]. Msg left for patient to inform her that rx was called to pharmacy.

## 2015-05-13 NOTE — Telephone Encounter (Signed)
Patient called and asked if she can have another rx for pain. The ibuprofen is not working.

## 2015-05-24 ENCOUNTER — Ambulatory Visit (INDEPENDENT_AMBULATORY_CARE_PROVIDER_SITE_OTHER): Payer: Managed Care, Other (non HMO) | Admitting: Obstetrics & Gynecology

## 2015-05-24 VITALS — BP 129/76 | HR 78 | Temp 99.2°F | Ht 64.5 in | Wt 217.3 lb

## 2015-05-24 DIAGNOSIS — Z9889 Other specified postprocedural states: Secondary | ICD-10-CM

## 2015-05-24 DIAGNOSIS — N939 Abnormal uterine and vaginal bleeding, unspecified: Secondary | ICD-10-CM

## 2015-05-24 MED ORDER — MEGESTROL ACETATE 40 MG PO TABS: 40.0000 mg | ORAL_TABLET | Freq: Two times a day (BID) | ORAL | Status: DC

## 2015-05-24 NOTE — Progress Notes (Signed)
[name], female   DOB: 11-Nov-1965, 49 y.o.   MRN: [id number] History:  49 y.o. [id number] here today for 2 week post op check and to discuss hyst. Pt reports that since the procedure she has had only minimal bleeding.   The following portions of the patient's history were reviewed and updated as appropriate: allergies, current medications, past family history, past medical history, past social history, past surgical history and problem list.  Review of Systems:  Pertinent items are noted in HPI.  Objective:  Physical Exam Blood pressure 129/76, pulse 78, temperature 99.2 F (37.3 C), height 5' 4.5" (1.638 m), weight 217 lb 4.8 oz (98.567 kg). Gen: NAD Abd: Soft, nontender and nondistended Pelvic: Normal appearing external genitalia; normal appearing vaginal mucosa and cervix.  Normal discharge.  Small uterus, no other palpable masses, no uterine or adnexal tenderness  Labs and Imaging No results found.  Assessment & Plan:  2 week post op check AUB- unable to complete resection due to [place] from prior ablation on hysteroscopy.  Possible adenomyosis.  Reviewed with pt tx options.  She is aware that the only definitve tx at this point is a hyst.  Pt is a good candidate for a TVH.    Refilled Megace 40mg  bid  Patient desires surgical management with Total vaginal hysterectomy with bilateral salpingectomy.  The risks of surgery were discussed in detail with the patient including but not limited to: bleeding which may require transfusion or reoperation; infection which may require prolonged hospitalization or re-hospitalization and antibiotic therapy; injury to bowel, bladder, ureters and major vessels or other surrounding organs; need for additional procedures including laparotomy; thromboembolic phenomenon, incisional problems and other postoperative or anesthesia complications.  Patient was told that the likelihood that her condition and symptoms will be treated effectively  with this surgical management was very high; the postoperative expectations were also discussed in detail. The patient also understands the alternative treatment options which were discussed in full. All questions were answered.  She was told that she will be contacted by our surgical scheduler regarding the time and date of her surgery; routine preoperative instructions of having nothing to eat or drink after midnight on the day prior to surgery and also coming to the hospital 1 1/2 hours prior to her time of surgery were also emphasized.  She was told she may be called for a preoperative appointment about a week prior to surgery and will be given further preoperative instructions at that visit. Printed patient education handouts about the procedure were given to the patient to review at home.  Lori Green, M.D., [name]

## 2015-05-24 NOTE — Patient Instructions (Signed)
Hysterectomy Information  A hysterectomy is a surgery in which your uterus is removed. This surgery may be done to treat various medical problems. After the surgery, you will no longer have menstrual periods. The surgery will also make you unable to become pregnant (sterile). The fallopian tubes and ovaries can be removed (bilateral salpingo-oophorectomy) during this surgery as well.  REASONS FOR A HYSTERECTOMY  Persistent, abnormal bleeding.  Lasting (chronic) pelvic pain or infection.  The lining of the uterus (endometrium) starts growing outside the uterus (endometriosis).  The endometrium starts growing in the muscle of the uterus (adenomyosis).  The uterus falls down into the vagina (pelvic organ prolapse).  Noncancerous growths in the uterus (uterine fibroids) that cause symptoms.  Precancerous cells.  Cervical cancer or uterine cancer. TYPES OF HYSTERECTOMIES  Supracervical hysterectomy--In this type, the top part of the uterus is removed, but not the cervix.  Total hysterectomy--The uterus and cervix are removed.  Radical hysterectomy--The uterus, the cervix, and the fibrous tissue that holds the uterus in place in the pelvis (parametrium) are removed. WAYS A HYSTERECTOMY CAN BE PERFORMED  Abdominal hysterectomy--A large surgical cut (incision) is made in the abdomen. The uterus is removed through this incision.  Vaginal hysterectomy--An incision is made in the vagina. The uterus is removed through this incision. There are no abdominal incisions.  Conventional laparoscopic hysterectomy--Three or four small incisions are made in the abdomen. A thin, lighted tube with a camera (laparoscope) is inserted into one of the incisions. Other tools are put through the other incisions. The uterus is cut into small pieces. The small pieces are removed through the incisions, or they are removed through the vagina.  Laparoscopically assisted vaginal hysterectomy (LAVH)--Three or four  small incisions are made in the abdomen. Part of the surgery is performed laparoscopically and part vaginally. The uterus is removed through the vagina.  Robot-assisted laparoscopic hysterectomy--A laparoscope and other tools are inserted into 3 or 4 small incisions in the abdomen. A computer-controlled device is used to give the surgeon a 3D image and to help control the surgical instruments. This allows for more precise movements of surgical instruments. The uterus is cut into small pieces and removed through the incisions or removed through the vagina. RISKS AND COMPLICATIONS  Possible complications associated with this procedure include:  Bleeding and risk of blood transfusion. Tell your health care provider if you do not want to receive any blood products.  Blood clots in the legs or lung.  Infection.  Injury to surrounding organs.  Problems or side effects related to anesthesia.  Conversion to an abdominal hysterectomy from one of the other techniques. WHAT TO EXPECT AFTER A HYSTERECTOMY  You will be given pain medicine.  You will need to have someone with you for the first 3-5 days after you go home.  You will need to follow up with your surgeon in 2-4 weeks after surgery to evaluate your progress.  You may have early menopause symptoms such as hot flashes, night sweats, and insomnia.  If you had a hysterectomy for a problem that was not cancer or not a condition that could lead to cancer, then you no longer need Pap tests. However, even if you no longer need a Pap test, a regular exam is a good idea to make sure no other problems are starting. Document Released: 04/17/2001 Document Revised: 08/12/2013 Document Reviewed: 06/29/2013 [facility] Patient Information 2015 [place], [place]. This information is not intended to replace advice given to you by your health care  provider. Make sure you discuss any questions you have with your health care provider.

## 2015-05-25 ENCOUNTER — Encounter: Payer: Self-pay | Admitting: Obstetrics & Gynecology

## 2015-06-02 ENCOUNTER — Encounter (HOSPITAL_COMMUNITY): Payer: Self-pay | Admitting: *Deleted

## 2015-06-02 ENCOUNTER — Telehealth: Payer: Self-pay | Admitting: *Deleted

## 2015-06-02 NOTE — Telephone Encounter (Signed)
Received message on nurse line left on 06/02/15 at 0937.  Patient states she has been bleeding since Wednesday.  States she is having excruciating pain.  Has been taking the medication that was previously prescribed but it is not helping.  States she also wants to know when her hysterectomy will be scheduled.

## 2015-06-03 ENCOUNTER — Other Ambulatory Visit: Payer: Self-pay | Admitting: Obstetrics & Gynecology

## 2015-06-03 ENCOUNTER — Encounter: Payer: Self-pay | Admitting: *Deleted

## 2015-06-03 DIAGNOSIS — R102 Pelvic and perineal pain: Secondary | ICD-10-CM

## 2015-06-03 MED ORDER — OXYCODONE-ACETAMINOPHEN 5-325 MG PO TABS: 1.0000 | ORAL_TABLET | Freq: Four times a day (QID) | ORAL | Status: DC | PRN

## 2015-06-03 NOTE — Telephone Encounter (Signed)
Patient came to office and picked up prescription for percocet.

## 2015-06-03 NOTE — Telephone Encounter (Signed)
Spoke with Dr. Ihor Dow.  Prescription obtained.  Phoned patient.  She is on her way to pick it up and will call me when she arrives as the office is now closed.  I verified with her that the surgery date of 07/19/15 was okay.  Patient states that is great.

## 2015-06-17 ENCOUNTER — Encounter (HOSPITAL_BASED_OUTPATIENT_CLINIC_OR_DEPARTMENT_OTHER): Payer: Self-pay | Admitting: *Deleted

## 2015-06-17 ENCOUNTER — Emergency Department (HOSPITAL_BASED_OUTPATIENT_CLINIC_OR_DEPARTMENT_OTHER)
Admission: EM | Admit: 2015-06-17 | Discharge: 2015-06-17 | Disposition: A | Discharge status: Home / Self Care | Payer: Managed Care, Other (non HMO) | Attending: Emergency Medicine | Admitting: Emergency Medicine

## 2015-06-17 ENCOUNTER — Emergency Department (HOSPITAL_BASED_OUTPATIENT_CLINIC_OR_DEPARTMENT_OTHER): Payer: Managed Care, Other (non HMO)

## 2015-06-17 DIAGNOSIS — R109 Unspecified abdominal pain: Secondary | ICD-10-CM

## 2015-06-17 DIAGNOSIS — Z79899 Other long term (current) drug therapy: Secondary | ICD-10-CM | POA: Insufficient documentation

## 2015-06-17 DIAGNOSIS — R110 Nausea: Secondary | ICD-10-CM | POA: Insufficient documentation

## 2015-06-17 DIAGNOSIS — R1033 Periumbilical pain: Secondary | ICD-10-CM | POA: Diagnosis not present

## 2015-06-17 DIAGNOSIS — K5900 Constipation, unspecified: Secondary | ICD-10-CM | POA: Insufficient documentation

## 2015-06-17 DIAGNOSIS — R140 Abdominal distension (gaseous): Secondary | ICD-10-CM | POA: Diagnosis present

## 2015-06-17 DIAGNOSIS — Z3202 Encounter for pregnancy test, result negative: Secondary | ICD-10-CM | POA: Diagnosis not present

## 2015-06-17 DIAGNOSIS — N926 Irregular menstruation, unspecified: Secondary | ICD-10-CM | POA: Diagnosis not present

## 2015-06-17 LAB — PREGNANCY, URINE: Preg Test, Ur: NEGATIVE

## 2015-06-17 LAB — URINALYSIS, ROUTINE W REFLEX MICROSCOPIC
Bilirubin Urine: NEGATIVE
Glucose, UA: NEGATIVE mg/dL
KETONES UR: NEGATIVE mg/dL
Leukocytes, UA: NEGATIVE
Nitrite: NEGATIVE
Protein, ur: NEGATIVE mg/dL
Specific Gravity, Urine: 1.023 (ref 1.005–1.030)
Urobilinogen, UA: 1 mg/dL (ref 0.0–1.0)
pH: 6.5 (ref 5.0–8.0)

## 2015-06-17 LAB — COMPREHENSIVE METABOLIC PANEL
ALT: 14 U/L (ref 14–54)
AST: 20 U/L (ref 15–41)
Albumin: 3.7 g/dL (ref 3.5–5.0)
Alkaline Phosphatase: 62 U/L (ref 38–126)
Anion gap: 7 (ref 5–15)
BUN: 10 mg/dL (ref 6–20)
CALCIUM: 9.3 mg/dL (ref 8.9–10.3)
CHLORIDE: 105 mmol/L (ref 101–111)
CO2: 24 mmol/L (ref 22–32)
CREATININE: 0.83 mg/dL (ref 0.44–1.00)
GFR calc non Af Amer: >60 mL/min (ref >60)
Glucose, Bld: 133 mg/dL — ABNORMAL HIGH (ref 65–99)
Potassium: 3.8 mmol/L (ref 3.5–5.1)
Sodium: 136 mmol/L (ref 135–145)
Total Bilirubin: 0.3 mg/dL (ref 0.3–1.2)
Total Protein: 7.4 g/dL (ref 6.5–8.1)

## 2015-06-17 LAB — URINE MICROSCOPIC-ADD ON

## 2015-06-17 LAB — CBC
HCT: 33.1 % — ABNORMAL LOW (ref 36.0–46.0)
HEMOGLOBIN: 10.5 g/dL — AB (ref 12.0–15.0)
MCH: 26.1 pg (ref 26.0–34.0)
MCHC: 31.7 g/dL (ref 30.0–36.0)
MCV: 82.3 fL (ref 78.0–100.0)
PLATELETS: 347 10*3/uL (ref 150–400)
RBC: 4.02 MIL/uL (ref 3.87–5.11)
RDW: 14.7 % (ref 11.5–15.5)
WBC: 7.1 10*3/uL (ref 4.0–10.5)

## 2015-06-17 MED ORDER — IOHEXOL 300 MG/ML  SOLN
100.0000 mL | Freq: Once | INTRAMUSCULAR | Status: AC | PRN
  Administered 2015-06-17: 100 mL via INTRAVENOUS

## 2015-06-17 MED ORDER — IOHEXOL 300 MG/ML  SOLN
25.0000 mL | Freq: Once | INTRAMUSCULAR | Status: AC | PRN
  Administered 2015-06-17: 25 mL via ORAL

## 2015-06-17 MED ORDER — MORPHINE SULFATE 4 MG/ML IJ SOLN
4.0000 mg | Freq: Once | INTRAMUSCULAR | Status: AC
  Administered 2015-06-17: 4 mg via INTRAVENOUS
  Filled 2015-06-17: qty 1

## 2015-06-17 NOTE — ED Provider Notes (Addendum)
CSN: [id number]     Arrival date & time 06/17/15  8889 History  This chart was scribed for Lori Dakin, MD by Rayna Sexton, ED scribe. This patient was seen in room MH08/MH08 and the patient's care was started at 9:12 PM.   Chief Complaint  Patient presents with  . Bloated   The history is provided by the patient. No language interpreter was used.    HPI Comments: Lori Green is a 49 y.o. female who presents to the Emergency Department complaining of constant, moderate, constipation with onset 2 weeks ago. Pt notes an associated feeling of being bloated, intermittent nausea and moderate to severe abd pain and is unsure of when she had her last BM. She continues to pass gas per rectum. She had a slight bowel movement 4 days ago She notes taking OTC laxatives which provided no relief of her constipation. She notes that she is passing a small amount of gas. Pt notes she has a hysterectomy scheduled next month and also notes a shx of uterine ablation. She denies any other known health issues, taking any other medications currently or any known drug allergies. . She denies any other associated symptoms. No urinary symptoms  Past Medical History  Diagnosis Date  . Menorrhagia   . Bartholin gland cyst   . SVD (spontaneous vaginal delivery)     x 6   Past Surgical History  Procedure Laterality Date  . Bunionectomy  06/17/12    lt  . Breast reduction surgery  04/2003  . Tubal ligation  01/1994  . Hysteroscopy with novasure N/A 03/01/2014    Procedure: HYSTEROSCOPY WITH NOVASURE;  Surgeon: Lavonia Drafts, MD;  Location: [facility];  Service: Gynecology;  Laterality: N/A;  . Carpal tunnel release Left 06/25/2014    Procedure: LEFT CARPAL TUNNEL RELEASE;  Surgeon: Daryll Brod, MD;  Location: [facility];  Service: Orthopedics;  Laterality: Left;  . Wisdom tooth extraction    . Dilatation & curettage/hysteroscopy with myosure N/A 05/10/2015    Procedure: [place];  Surgeon: Lavonia Drafts, MD;  Location: [facility];  Service: Gynecology;  Laterality: N/A;   Family History  Problem Relation Age of Onset  . Heart disease Father   . Stroke Father   . Diabetes Son    Social History  Substance Use Topics  . Smoking status: Never Smoker   . Smokeless tobacco: Never Used  . Alcohol Use: No   no illicit drug use OB History    Gravida Para Term Preterm AB TAB SAB Ectopic Multiple Living   6 6 6       6      Review of Systems  Constitutional: Negative.   HENT: Negative.   Respiratory: Negative.   Cardiovascular: Negative.   Gastrointestinal: Positive for nausea, abdominal pain, constipation and abdominal distention.  Genitourinary:       Irregular menses  Musculoskeletal: Negative.   Skin: Negative.   Neurological: Negative.   Psychiatric/Behavioral: Negative.   All other systems reviewed and are negative.   Allergies  Review of patient's allergies indicates no known allergies.  Home Medications   Prior to Admission medications   Medication Sig Start Date End Date Taking? Authorizing Provider  Adapalene (DIFFERIN) 0.1 % LOTN  11/10/14   Historical Provider, MD  Hydrocodone-Acetaminophen 5-300 MG TABS Take 1 tablet by mouth 3 (three) times daily as needed. Patient not taking: Reported on 05/24/2015 03/25/15   Lavonia Drafts, MD  ibuprofen (ADVIL,MOTRIN) 600 MG  tablet Take 1 tablet (600 mg total) by mouth every 6 (six) hours as needed. 05/10/15   Lavonia Drafts, MD  megestrol (MEGACE) 40 MG tablet Take 1 tablet (40 mg total) by mouth 2 (two) times daily. 05/24/15   Lavonia Drafts, MD  oxyCODONE-acetaminophen (PERCOCET/ROXICET) 5-325 MG per tablet Take 1-2 tablets by mouth every 6 (six) hours as needed. 06/03/15   Lavonia Drafts, MD  PROAIR HFA 108 (90 BASE) MCG/ACT inhaler INL 2 PFS PO QID 04/26/15   Historical Provider, MD  traMADol (ULTRAM) 50 MG tablet Take 1 tablet (50 mg  total) by mouth every 6 (six) hours as needed. 05/13/15   Gwen Pounds, CNM   BP 164/79 mmHg  Pulse 81  Temp(Src) 98.3 F (36.8 C) (Oral)  Resp 20  Ht 5\' 4"  (1.626 m)  Wt 217 lb (98.431 kg)  BMI 37.23 kg/m2  SpO2 100%  LMP 05/27/2015 Physical Exam  Constitutional: She appears well-developed and well-nourished.  HENT:  Head: Normocephalic and atraumatic.  Eyes: Conjunctivae are normal. Pupils are equal, round, and reactive to light.  Neck: Neck supple. No tracheal deviation present. No thyromegaly present.  Cardiovascular: Normal rate and regular rhythm.   No murmur heard. Pulmonary/Chest: Effort normal and breath sounds normal.  Abdominal: Soft. Bowel sounds are normal. She exhibits no distension. There is tenderness.  Obese, periumbilical tenderness  Genitourinary:  Brown stool no gross blood  Musculoskeletal: Normal range of motion. She exhibits no edema or tenderness.  Neurological: She is alert. Coordination normal.  Skin: Skin is warm and dry. No rash noted.  Psychiatric: She has a normal mood and affect.  Nursing note and vitals reviewed.   ED Course  Procedures  DIAGNOSTIC STUDIES: Oxygen Saturation is 100% on RA, normal by my interpretation.    COORDINATION OF CARE: 9:18 PM Discussed treatment plan with pt at bedside and pt agreed to plan.  Labs Review Labs Reviewed  URINALYSIS, ROUTINE W REFLEX MICROSCOPIC (NOT AT [facility]) - Abnormal; Notable for the following:    Hgb urine dipstick MODERATE (*)    All other components within normal limits  URINE MICROSCOPIC-ADD ON - Abnormal; Notable for the following:    Bacteria, UA FEW (*)    All other components within normal limits    Imaging Review No results found. I, Rayna Sexton, personally reviewed and evaluated these images and lab results as part of my medical decision-making.   EKG Interpretation None     11:20 PM patient resting comfortably stating she feels improved since being here X-rays viewed by  me Results for orders placed or performed during the hospital encounter of 06/17/15  Urinalysis, Routine w reflex microscopic (not at [facility])  Result Value Ref Range   Color, Urine YELLOW YELLOW   APPearance CLEAR CLEAR   Specific Gravity, Urine 1.023 1.005 - 1.030   pH 6.5 5.0 - 8.0   Glucose, UA NEGATIVE NEGATIVE mg/dL   Hgb urine dipstick MODERATE (A) NEGATIVE   Bilirubin Urine NEGATIVE NEGATIVE   Ketones, ur NEGATIVE NEGATIVE mg/dL   Protein, ur NEGATIVE NEGATIVE mg/dL   Urobilinogen, UA 1.0 0.0 - 1.0 mg/dL   Nitrite NEGATIVE NEGATIVE   Leukocytes, UA NEGATIVE NEGATIVE  Urine microscopic-add on  Result Value Ref Range   Squamous Epithelial / LPF RARE RARE   RBC / HPF 7-10 <3 RBC/hpf   Bacteria, UA FEW (A) RARE  Pregnancy, urine  Result Value Ref Range   Preg Test, Ur NEGATIVE NEGATIVE  Comprehensive metabolic panel  Result  Value Ref Range   Sodium 136 135 - 145 mmol/L   Potassium 3.8 3.5 - 5.1 mmol/L   Chloride 105 101 - 111 mmol/L   CO2 24 22 - 32 mmol/L   Glucose, Bld 133 (H) 65 - 99 mg/dL   BUN 10 6 - 20 mg/dL   Creatinine, Ser 0.83 0.44 - 1.00 mg/dL   Calcium 9.3 8.9 - 10.3 mg/dL   Total Protein 7.4 6.5 - 8.1 g/dL   Albumin 3.7 3.5 - 5.0 g/dL   AST 20 15 - 41 U/L   ALT 14 14 - 54 U/L   Alkaline Phosphatase 62 38 - 126 U/L   Total Bilirubin 0.3 0.3 - 1.2 mg/dL   GFR calc non Af Amer >60 >60 mL/min   GFR calc Af Amer >60 >60 mL/min   Anion gap 7 5 - 15  CBC  Result Value Ref Range   WBC 7.1 4.0 - 10.5 K/uL   RBC 4.02 3.87 - 5.11 MIL/uL   Hemoglobin 10.5 (L) 12.0 - 15.0 g/dL   HCT 33.1 (L) 36.0 - 46.0 %   MCV 82.3 78.0 - 100.0 fL   MCH 26.1 26.0 - 34.0 pg   MCHC 31.7 30.0 - 36.0 g/dL   RDW 14.7 11.5 - 15.5 %   Platelets 347 150 - 400 K/uL   Ct Abdomen Pelvis W Contrast  06/17/2015   CLINICAL DATA:  Constipation and bloating for 2 weeks. Nausea and mild abdominal pain.  EXAM: CT ABDOMEN AND PELVIS WITH CONTRAST  TECHNIQUE: Multidetector CT imaging of the  abdomen and pelvis was performed using the standard protocol following bolus administration of intravenous contrast.  CONTRAST:  1mL OMNIPAQUE IOHEXOL 300 MG/ML SOLN, 146mL OMNIPAQUE IOHEXOL 300 MG/ML SOLN  COMPARISON:  Acute abdominal series 06/17/2015  FINDINGS: Lung bases are clear without focal nodule, mass, or airspace disease. The heart size is normal. No significant pleural or pericardial effusion is present.  The liver and spleen are within normal limits. The stomach, duodenum, and pancreas are unremarkable. The common bile duct and gallbladder are within normal limits. The adrenal glands are normal bilaterally. The kidneys ureters are within normal limits. Urinary bladder is unremarkable.  The rectosigmoid colon is within normal limits. Remainder the colon is unremarkable. The appendix is visualized and normal. Small bowel is within normal limits. There is no significant adenopathy or free fluid.  The uterus and adnexa are within normal limits for age.  The bone windows are unremarkable.  IMPRESSION: 1. No acute or focal lesion to explain the patient's symptoms. 2. No significant stool burden or a gaseous distention is evident.   Electronically Signed   By: San Morelle M.D.   On: 06/17/2015 22:58   Dg Abd Acute W/chest  06/17/2015   CLINICAL DATA:  Flu wheel pre skull bloating, constipation  EXAM: DG ABDOMEN ACUTE W/ 1V CHEST  COMPARISON:  None.  FINDINGS: There is no evidence of dilated bowel loops or free intraperitoneal air. No radiopaque calculi or other significant radiographic abnormality is seen. Heart size and mediastinal contours are within normal limits. Both lungs are clear. Moderate stool noted in right colon. Some stool noted in descending colon.  IMPRESSION: Negative abdominal radiographs. No acute cardiopulmonary disease. Moderate stool noted in right colon.   Electronically Signed   By: Lahoma Crocker M.D.   On: 06/17/2015 21:23    MDM  I had a lengthy discussion with patient .  Her diet consists of lot of fast foods and  fried foods I suggest salads fruits, vegetables and avoid fast foods and adequate liquid intake Follow-up with Dr. Everlene Farrier if not improved in a week. Microscopic hematuria likely secondary to vaginal bleeding. She denies urinary sx.Anemia is chronic Final diagnoses:  None   Dx #1non specific abdominal pain #2 anemia   I personally performed the services described in this documentation, which was scribed in my presence. The recorded information has been reviewed and considered.    Lori Dakin, MD 06/17/15 [id number]  Lori Dakin, MD 06/17/15 2358

## 2015-06-17 NOTE — ED Notes (Signed)
Bloating and constipation. She took OTC laxative with no relief.

## 2015-06-17 NOTE — ED Notes (Signed)
Feels bloated and abd pain x several days  Unsure when had last normal bm,  Took otc laxatives but no relief

## 2015-06-17 NOTE — Discharge Instructions (Signed)
Abdominal Pain, Women Avoid greasy ,fried and fast foods. Eat lots of fruits and vegetables. Make yourself a salad for lunch each day. Drink at least six 8 ounce last of water daily. If not improved in 7-10 days, call Dr. Everlene Farrier to schedule an office visit. You can also asked Dr. Everlene Farrier to design a diet and exercise program for you. Abdominal (stomach, pelvic, or belly) pain can be caused by many things. It is important to tell your doctor:  The location of the pain.  Does it come and go or is it present all the time?  Are there things that start the pain (eating certain foods, exercise)?  Are there other symptoms associated with the pain (fever, nausea, vomiting, diarrhea)? All of this is helpful to know when trying to find the cause of the pain. CAUSES   Stomach: virus or bacteria infection, or ulcer.  Intestine: appendicitis (inflamed appendix), regional ileitis (Crohn's disease), ulcerative colitis (inflamed colon), irritable bowel syndrome, diverticulitis (inflamed diverticulum of the colon), or cancer of the stomach or intestine.  Gallbladder disease or stones in the gallbladder.  Kidney disease, kidney stones, or infection.  Pancreas infection or cancer.  Fibromyalgia (pain disorder).  Diseases of the female organs:  Uterus: fibroid (non-cancerous) tumors or infection.  Fallopian tubes: infection or tubal pregnancy.  Ovary: cysts or tumors.  Pelvic adhesions (scar tissue).  Endometriosis (uterus lining tissue growing in the pelvis and on the pelvic organs).  Pelvic congestion syndrome (female organs filling up with blood just before the menstrual period).  Pain with the menstrual period.  Pain with ovulation (producing an egg).  Pain with an IUD (intrauterine device, birth control) in the uterus.  Cancer of the female organs.  Functional pain (pain not caused by a disease, may improve without treatment).  Psychological pain.  Depression. DIAGNOSIS  Your doctor  will decide the seriousness of your pain by doing an examination.  Blood tests.  X-rays.  Ultrasound.  CT scan (computed tomography, special type of X-ray).  MRI (magnetic resonance imaging).  Cultures, for infection.  Barium enema (dye inserted in the large intestine, to better view it with X-rays).  Colonoscopy (looking in intestine with a lighted tube).  Laparoscopy (minor surgery, looking in abdomen with a lighted tube).  Major abdominal exploratory surgery (looking in abdomen with a large incision). TREATMENT  The treatment will depend on the cause of the pain.   Many cases can be observed and treated at home.  Over-the-counter medicines recommended by your caregiver.  Prescription medicine.  Antibiotics, for infection.  Birth control pills, for painful periods or for ovulation pain.  Hormone treatment, for endometriosis.  Nerve blocking injections.  Physical therapy.  Antidepressants.  Counseling with a psychologist or psychiatrist.  Minor or major surgery. HOME CARE INSTRUCTIONS   Do not take laxatives, unless directed by your caregiver.  Take over-the-counter pain medicine only if ordered by your caregiver. Do not take aspirin because it can cause an upset stomach or bleeding.  Try a clear liquid diet (broth or water) as ordered by your caregiver. Slowly move to a bland diet, as tolerated, if the pain is related to the stomach or intestine.  Have a thermometer and take your temperature several times a day, and record it.  Bed rest and sleep, if it helps the pain.  Avoid sexual intercourse, if it causes pain.  Avoid stressful situations.  Keep your follow-up appointments and tests, as your caregiver orders.  If the pain does not go away  with medicine or surgery, you may try:  Acupuncture.  Relaxation exercises (yoga, meditation).  Group therapy.  Counseling. SEEK MEDICAL CARE IF:   You notice certain foods cause stomach pain.  Your  home care treatment is not helping your pain.  You need stronger pain medicine.  You want your IUD removed.  You feel faint or lightheaded.  You develop nausea and vomiting.  You develop a rash.  You are having side effects or an allergy to your medicine. SEEK IMMEDIATE MEDICAL CARE IF:   Your pain does not go away or gets worse.  You have a fever.  Your pain is felt only in portions of the abdomen. The right side could possibly be appendicitis. The left lower portion of the abdomen could be colitis or diverticulitis.  You are passing blood in your stools (bright red or black tarry stools, with or without vomiting).  You have blood in your urine.  You develop chills, with or without a fever.  You pass out. MAKE SURE YOU:   Understand these instructions.  Will watch your condition.  Will get help right away if you are not doing well or get worse. Document Released: 08/19/2007 Document Revised: 03/08/2014 Document Reviewed: 09/08/2009 [facility] Patient Information 2015 [place], [place]. This information is not intended to replace advice given to you by your health care provider. Make sure you discuss any questions you have with your health care provider.

## 2015-07-08 ENCOUNTER — Encounter (HOSPITAL_COMMUNITY): Payer: Self-pay

## 2015-07-08 ENCOUNTER — Encounter (HOSPITAL_COMMUNITY)
Admission: RE | Admit: 2015-07-08 | Discharge: 2015-07-08 | Disposition: A | Discharge status: Home / Self Care | Payer: Managed Care, Other (non HMO) | Source: Ambulatory Visit | Attending: Obstetrics & Gynecology | Admitting: Obstetrics & Gynecology

## 2015-07-08 DIAGNOSIS — Z01818 Encounter for other preprocedural examination: Secondary | ICD-10-CM | POA: Insufficient documentation

## 2015-07-08 LAB — CBC
HCT: 33.9 % — ABNORMAL LOW (ref 36.0–46.0)
Hemoglobin: 10.9 g/dL — ABNORMAL LOW (ref 12.0–15.0)
MCH: 26.5 pg (ref 26.0–34.0)
MCHC: 32.2 g/dL (ref 30.0–36.0)
MCV: 82.5 fL (ref 78.0–100.0)
PLATELETS: 299 10*3/uL (ref 150–400)
RBC: 4.11 MIL/uL (ref 3.87–5.11)
RDW: 15.8 % — AB (ref 11.5–15.5)
WBC: 5.8 10*3/uL (ref 4.0–10.5)

## 2015-07-08 LAB — TYPE AND SCREEN
ABO/RH(D): B POS
ANTIBODY SCREEN: NEGATIVE

## 2015-07-08 LAB — ABO/RH: ABO/RH(D): B POS

## 2015-07-08 NOTE — Patient Instructions (Signed)
Your procedure is scheduled on: July 19, 2015   Enter through the Main Entrance of [facility] at:  8:30 am   Pick up the phone at the desk and dial [phone].  Call this number if you have problems the morning of surgery: [phone].  Remember: Do NOT eat food: after midnight on Monday 07-18-15  Do NOT drink clear liquids after: midnight on Monday 07-18-15   Take these medicines the morning of surgery with a SIP OF WATER:  None  Bring inhaler with you day of surgery   Do NOT wear jewelry (body piercing), metal hair clips/bobby pins, make-up, or nail polish. Do NOT wear lotions, powders, or perfumes.  You may wear deoderant. Do NOT shave for 48 hours prior to surgery. Do NOT bring valuables to the hospital. Contacts, dentures, or bridgework may not be worn into surgery. Leave suitcase in car.  After surgery it may be brought to your room.  For patients admitted to the hospital, checkout time is 11:00 AM the day of discharge.

## 2015-07-18 MED ORDER — CEFAZOLIN SODIUM-DEXTROSE 2-3 GM-% IV SOLR: 2.0000 g | INTRAVENOUS | Status: DC

## 2015-07-19 ENCOUNTER — Observation Stay (HOSPITAL_COMMUNITY)
Admission: RE | Admit: 2015-07-19 | Discharge: 2015-07-20 | Disposition: A | Discharge status: Home / Self Care | Payer: Managed Care, Other (non HMO) | Source: Ambulatory Visit | Attending: Obstetrics & Gynecology | Admitting: Obstetrics & Gynecology

## 2015-07-19 ENCOUNTER — Encounter (HOSPITAL_COMMUNITY)
Admission: RE | Disposition: A | Discharge status: Home / Self Care | Payer: Self-pay | Source: Ambulatory Visit | Attending: Obstetrics & Gynecology

## 2015-07-19 ENCOUNTER — Ambulatory Visit (HOSPITAL_COMMUNITY): Payer: Managed Care, Other (non HMO) | Admitting: Anesthesiology

## 2015-07-19 ENCOUNTER — Encounter (HOSPITAL_COMMUNITY): Payer: Self-pay | Admitting: Emergency Medicine

## 2015-07-19 DIAGNOSIS — I10 Essential (primary) hypertension: Secondary | ICD-10-CM | POA: Diagnosis not present

## 2015-07-19 DIAGNOSIS — Z79899 Other long term (current) drug therapy: Secondary | ICD-10-CM | POA: Diagnosis not present

## 2015-07-19 DIAGNOSIS — D259 Leiomyoma of uterus, unspecified: Secondary | ICD-10-CM | POA: Diagnosis not present

## 2015-07-19 DIAGNOSIS — Z6837 Body mass index (BMI) 37.0-37.9, adult: Secondary | ICD-10-CM | POA: Diagnosis not present

## 2015-07-19 DIAGNOSIS — N888 Other specified noninflammatory disorders of cervix uteri: Principal | ICD-10-CM | POA: Insufficient documentation

## 2015-07-19 DIAGNOSIS — E669 Obesity, unspecified: Secondary | ICD-10-CM | POA: Diagnosis not present

## 2015-07-19 DIAGNOSIS — N83209 Unspecified ovarian cyst, unspecified side: Secondary | ICD-10-CM

## 2015-07-19 DIAGNOSIS — Z9889 Other specified postprocedural states: Secondary | ICD-10-CM

## 2015-07-19 DIAGNOSIS — N8320 Unspecified ovarian cysts: Secondary | ICD-10-CM | POA: Diagnosis not present

## 2015-07-19 DIAGNOSIS — N838 Other noninflammatory disorders of ovary, fallopian tube and broad ligament: Secondary | ICD-10-CM | POA: Insufficient documentation

## 2015-07-19 DIAGNOSIS — N938 Other specified abnormal uterine and vaginal bleeding: Secondary | ICD-10-CM | POA: Diagnosis present

## 2015-07-19 DIAGNOSIS — N840 Polyp of corpus uteri: Secondary | ICD-10-CM | POA: Diagnosis not present

## 2015-07-19 HISTORY — PX: VAGINAL HYSTERECTOMY: SHX2639

## 2015-07-19 HISTORY — PX: OOPHORECTOMY: SHX6387

## 2015-07-19 HISTORY — PX: BILATERAL SALPINGECTOMY: SHX5743

## 2015-07-19 LAB — PREGNANCY, URINE: Preg Test, Ur: NEGATIVE

## 2015-07-19 SURGERY — HYSTERECTOMY, VAGINAL
Anesthesia: General | Site: Vagina | Laterality: Right

## 2015-07-19 MED ORDER — METOCLOPRAMIDE HCL 5 MG/ML IJ SOLN: 10.0000 mg | Freq: Once | INTRAMUSCULAR | Status: DC | PRN

## 2015-07-19 MED ORDER — LACTATED RINGERS IV SOLN
INTRAVENOUS | Status: DC
  Administered 2015-07-19 (×3): via INTRAVENOUS

## 2015-07-19 MED ORDER — LIDOCAINE HCL (CARDIAC) 20 MG/ML IV SOLN
INTRAVENOUS | Status: AC
  Filled 2015-07-19: qty 5

## 2015-07-19 MED ORDER — DEXAMETHASONE SODIUM PHOSPHATE 10 MG/ML IJ SOLN
INTRAMUSCULAR | Status: AC
  Filled 2015-07-19: qty 1

## 2015-07-19 MED ORDER — ROCURONIUM BROMIDE 100 MG/10ML IV SOLN
INTRAVENOUS | Status: AC
  Filled 2015-07-19: qty 1

## 2015-07-19 MED ORDER — KETOROLAC TROMETHAMINE 30 MG/ML IJ SOLN
30.0000 mg | Freq: Once | INTRAMUSCULAR | Status: DC
Start: 2015-07-19 — End: 2015-07-19

## 2015-07-19 MED ORDER — CEFAZOLIN SODIUM-DEXTROSE 2-3 GM-% IV SOLR
INTRAVENOUS | Status: AC
  Administered 2015-07-19: 2 g via INTRAVENOUS
  Filled 2015-07-19: qty 50

## 2015-07-19 MED ORDER — PHENYLEPHRINE HCL 10 MG/ML IJ SOLN
INTRAMUSCULAR | Status: DC | PRN
  Administered 2015-07-19: 40 ug via INTRAVENOUS

## 2015-07-19 MED ORDER — PANTOPRAZOLE SODIUM 40 MG PO TBEC
40.0000 mg | DELAYED_RELEASE_TABLET | Freq: Every day | ORAL | Status: DC
  Administered 2015-07-20: 40 mg via ORAL
  Filled 2015-07-19: qty 1

## 2015-07-19 MED ORDER — MEPERIDINE HCL 25 MG/ML IJ SOLN: 6.2500 mg | INTRAMUSCULAR | Status: DC | PRN

## 2015-07-19 MED ORDER — ROCURONIUM BROMIDE 100 MG/10ML IV SOLN
INTRAVENOUS | Status: DC | PRN
  Administered 2015-07-19: 10 mg via INTRAVENOUS
  Administered 2015-07-19: 40 mg via INTRAVENOUS
  Administered 2015-07-19: 10 mg via INTRAVENOUS

## 2015-07-19 MED ORDER — DEXAMETHASONE SODIUM PHOSPHATE 4 MG/ML IJ SOLN
INTRAMUSCULAR | Status: DC | PRN
  Administered 2015-07-19: 4 mg via INTRAVENOUS

## 2015-07-19 MED ORDER — OXYCODONE-ACETAMINOPHEN 5-325 MG PO TABS
1.0000 | ORAL_TABLET | ORAL | Status: DC | PRN
  Administered 2015-07-20 (×2): 1 via ORAL
  Filled 2015-07-19 (×2): qty 1

## 2015-07-19 MED ORDER — DEXTROSE-NACL 5-0.45 % IV SOLN
INTRAVENOUS | Status: DC
  Administered 2015-07-19: 13:00:00 via INTRAVENOUS

## 2015-07-19 MED ORDER — ESTRADIOL 0.1 MG/GM VA CREA
TOPICAL_CREAM | VAGINAL | Status: DC | PRN
  Administered 2015-07-19: 1 via VAGINAL

## 2015-07-19 MED ORDER — HYDROMORPHONE HCL 1 MG/ML IJ SOLN
0.2500 mg | INTRAMUSCULAR | Status: DC | PRN
  Administered 2015-07-19: 0.25 mg via INTRAVENOUS
  Administered 2015-07-19: 0.5 mg via INTRAVENOUS
  Administered 2015-07-19: 0.25 mg via INTRAVENOUS

## 2015-07-19 MED ORDER — SODIUM CHLORIDE 0.9 % IJ SOLN
INTRAMUSCULAR | Status: AC
  Filled 2015-07-19: qty 50

## 2015-07-19 MED ORDER — NEOSTIGMINE METHYLSULFATE 10 MG/10ML IV SOLN
INTRAVENOUS | Status: DC | PRN
  Administered 2015-07-19: 3 mg via INTRAVENOUS

## 2015-07-19 MED ORDER — VASOPRESSIN 20 UNIT/ML IV SOLN
INTRAVENOUS | Status: DC | PRN
  Administered 2015-07-19: 40 mL

## 2015-07-19 MED ORDER — GLYCOPYRROLATE 0.2 MG/ML IJ SOLN
INTRAMUSCULAR | Status: DC | PRN
  Administered 2015-07-19: 0.1 mg via INTRAVENOUS
  Administered 2015-07-19: 0.4 mg via INTRAVENOUS

## 2015-07-19 MED ORDER — DOCUSATE SODIUM 100 MG PO CAPS
100.0000 mg | ORAL_CAPSULE | Freq: Two times a day (BID) | ORAL | Status: DC
  Administered 2015-07-20 (×2): 100 mg via ORAL
  Filled 2015-07-19 (×2): qty 1

## 2015-07-19 MED ORDER — NEOSTIGMINE METHYLSULFATE 10 MG/10ML IV SOLN
INTRAVENOUS | Status: AC
  Filled 2015-07-19: qty 1

## 2015-07-19 MED ORDER — PROPOFOL 10 MG/ML IV BOLUS
INTRAVENOUS | Status: DC | PRN
  Administered 2015-07-19: 160 mg via INTRAVENOUS

## 2015-07-19 MED ORDER — LIDOCAINE HCL (CARDIAC) 20 MG/ML IV SOLN
INTRAVENOUS | Status: DC | PRN
  Administered 2015-07-19: 80 mg via INTRAVENOUS

## 2015-07-19 MED ORDER — HYDROMORPHONE HCL 1 MG/ML IJ SOLN
INTRAMUSCULAR | Status: AC
  Filled 2015-07-19: qty 1

## 2015-07-19 MED ORDER — POLYETHYLENE GLYCOL 3350 17 G PO PACK
17.0000 g | PACK | Freq: Every day | ORAL | Status: DC | PRN
  Administered 2015-07-20: 17 g via ORAL

## 2015-07-19 MED ORDER — ONDANSETRON HCL 4 MG/2ML IJ SOLN
INTRAMUSCULAR | Status: DC | PRN
  Administered 2015-07-19: 4 mg via INTRAVENOUS

## 2015-07-19 MED ORDER — ESTRADIOL 0.1 MG/GM VA CREA
TOPICAL_CREAM | VAGINAL | Status: AC
  Filled 2015-07-19: qty 42.5

## 2015-07-19 MED ORDER — LACTATED RINGERS IV SOLN: INTRAVENOUS | Status: DC

## 2015-07-19 MED ORDER — BUPIVACAINE HCL (PF) 0.5 % IJ SOLN
INTRAMUSCULAR | Status: AC
  Filled 2015-07-19: qty 30

## 2015-07-19 MED ORDER — KETOROLAC TROMETHAMINE 30 MG/ML IJ SOLN
30.0000 mg | Freq: Three times a day (TID) | INTRAMUSCULAR | Status: AC
  Administered 2015-07-19 – 2015-07-20 (×2): 30 mg via INTRAVENOUS
  Filled 2015-07-19 (×2): qty 1

## 2015-07-19 MED ORDER — ONDANSETRON HCL 4 MG/2ML IJ SOLN
4.0000 mg | Freq: Four times a day (QID) | INTRAMUSCULAR | Status: DC | PRN
  Administered 2015-07-19: 4 mg via INTRAVENOUS
  Filled 2015-07-19: qty 2

## 2015-07-19 MED ORDER — FENTANYL CITRATE (PF) 250 MCG/5ML IJ SOLN
INTRAMUSCULAR | Status: AC
  Filled 2015-07-19: qty 25

## 2015-07-19 MED ORDER — HYDROMORPHONE HCL 1 MG/ML IJ SOLN
INTRAMUSCULAR | Status: DC | PRN
  Administered 2015-07-19: 1 mg via INTRAVENOUS

## 2015-07-19 MED ORDER — SCOPOLAMINE 1 MG/3DAYS TD PT72
1.0000 | MEDICATED_PATCH | Freq: Once | TRANSDERMAL | Status: DC
  Administered 2015-07-19: 1.5 mg via TRANSDERMAL

## 2015-07-19 MED ORDER — PROPOFOL 10 MG/ML IV BOLUS
INTRAVENOUS | Status: AC
  Filled 2015-07-19: qty 20

## 2015-07-19 MED ORDER — SCOPOLAMINE 1 MG/3DAYS TD PT72
MEDICATED_PATCH | TRANSDERMAL | Status: DC
Start: 2015-07-19 — End: 2015-07-20
  Administered 2015-07-19: 1.5 mg via TRANSDERMAL
  Filled 2015-07-19: qty 1

## 2015-07-19 MED ORDER — IBUPROFEN 600 MG PO TABS
600.0000 mg | ORAL_TABLET | Freq: Four times a day (QID) | ORAL | Status: DC
  Administered 2015-07-20: 600 mg via ORAL
  Filled 2015-07-19: qty 1

## 2015-07-19 MED ORDER — ONDANSETRON HCL 4 MG PO TABS: 4.0000 mg | ORAL_TABLET | Freq: Four times a day (QID) | ORAL | Status: DC | PRN

## 2015-07-19 MED ORDER — VASOPRESSIN 20 UNIT/ML IV SOLN
INTRAVENOUS | Status: AC
  Filled 2015-07-19: qty 1

## 2015-07-19 MED ORDER — INFLUENZA VAC SPLIT QUAD 0.5 ML IM SUSY: 0.5000 mL | PREFILLED_SYRINGE | INTRAMUSCULAR | Status: DC

## 2015-07-19 MED ORDER — 0.9 % SODIUM CHLORIDE (POUR BTL) OPTIME
TOPICAL | Status: DC | PRN
  Administered 2015-07-19: 1000 mL

## 2015-07-19 MED ORDER — ONDANSETRON HCL 4 MG/2ML IJ SOLN
INTRAMUSCULAR | Status: AC
  Filled 2015-07-19: qty 2

## 2015-07-19 MED ORDER — SIMETHICONE 80 MG PO CHEW: 80.0000 mg | CHEWABLE_TABLET | Freq: Four times a day (QID) | ORAL | Status: DC | PRN

## 2015-07-19 MED ORDER — KETOROLAC TROMETHAMINE 30 MG/ML IJ SOLN: 30.0000 mg | Freq: Three times a day (TID) | INTRAMUSCULAR | Status: AC

## 2015-07-19 MED ORDER — FENTANYL CITRATE (PF) 250 MCG/5ML IJ SOLN
INTRAMUSCULAR | Status: DC | PRN
  Administered 2015-07-19: 50 ug via INTRAVENOUS
  Administered 2015-07-19: 100 ug via INTRAVENOUS
  Administered 2015-07-19 (×2): 50 ug via INTRAVENOUS

## 2015-07-19 MED ORDER — MIDAZOLAM HCL 2 MG/2ML IJ SOLN
INTRAMUSCULAR | Status: AC
  Filled 2015-07-19: qty 4

## 2015-07-19 MED ORDER — HYDROMORPHONE HCL 1 MG/ML IJ SOLN: 0.2000 mg | INTRAMUSCULAR | Status: DC | PRN

## 2015-07-19 MED ORDER — ZOLPIDEM TARTRATE 5 MG PO TABS: 5.0000 mg | ORAL_TABLET | Freq: Every evening | ORAL | Status: DC | PRN

## 2015-07-19 MED ORDER — MIDAZOLAM HCL 2 MG/2ML IJ SOLN
INTRAMUSCULAR | Status: DC | PRN
  Administered 2015-07-19: 2 mg via INTRAVENOUS

## 2015-07-19 SURGICAL SUPPLY — 26 items
BLADE SURG 10 STRL SS (BLADE) ×1 IMPLANT
CANISTER SUCT 3000ML (MISCELLANEOUS) ×4 IMPLANT
CLOTH BEACON ORANGE TIMEOUT ST (SAFETY) ×4 IMPLANT
CONT PATH 16OZ SNAP LID 3702 (MISCELLANEOUS) ×1 IMPLANT
DECANTER SPIKE VIAL GLASS SM (MISCELLANEOUS) ×1 IMPLANT
GAUZE PACKING 2X5 YD STRL (GAUZE/BANDAGES/DRESSINGS) ×1 IMPLANT
GLOVE BIO SURGEON STRL SZ7 (GLOVE) ×4 IMPLANT
GLOVE BIOGEL PI IND STRL 6.5 (GLOVE) ×3 IMPLANT
GLOVE BIOGEL PI IND STRL 7.0 (GLOVE) ×3 IMPLANT
GLOVE BIOGEL PI INDICATOR 6.5 (GLOVE) ×1
GLOVE BIOGEL PI INDICATOR 7.0 (GLOVE) ×2
GOWN STRL REUS W/TWL LRG LVL3 (GOWN DISPOSABLE) ×16 IMPLANT
GOWN STRL REUS W/TWL XL LVL3 (GOWN DISPOSABLE) ×4 IMPLANT
NDL SPNL 20GX3.5 QUINCKE YW (NEEDLE) IMPLANT
NEEDLE SPNL 20GX3.5 QUINCKE YW (NEEDLE) ×4 IMPLANT
NS IRRIG 1000ML POUR BTL (IV SOLUTION) ×4 IMPLANT
PACK VAGINAL WOMENS (CUSTOM PROCEDURE TRAY) ×4 IMPLANT
PAD OB MATERNITY 4.3X12.25 (PERSONAL CARE ITEMS) ×4 IMPLANT
SHEARS FOC LG CVD HARMONIC 17C (MISCELLANEOUS) IMPLANT
SUT VIC AB 0 CT1 18XCR BRD8 (SUTURE) ×9 IMPLANT
SUT VIC AB 0 CT1 36 (SUTURE) ×8 IMPLANT
SUT VIC AB 0 CT1 8-18 (SUTURE) ×12
SUT VICRYL 0 TIES 12 18 (SUTURE) ×4 IMPLANT
TOWEL OR 17X24 6PK STRL BLUE (TOWEL DISPOSABLE) ×8 IMPLANT
TRAY FOLEY CATH SILVER 14FR (SET/KITS/TRAYS/PACK) ×4 IMPLANT
WATER STERILE IRR 1000ML POUR (IV SOLUTION) ×3 IMPLANT

## 2015-07-19 NOTE — Op Note (Signed)
07/19/2015  9:40 AM  PATIENT:  Lori Green  49 y.o. female  PRE-OPERATIVE DIAGNOSIS:   Abnormal uterine bleeding; uterine fibroids   POST-OPERATIVE DIAGNOSIS:   Abnormal uterine bleeding; uterine fibroids; right hemorrhagic ovarian cyst  PROCEDURE:  Procedure(s): HYSTERECTOMY VAGINAL (N/A) BILATERAL SALPINGECTOMY (Bilateral) PARTIAL OOPHORECTOMY (Right)  SURGEON:  Surgeon(s) and Role:    * Lori Drafts, MD - Primary    * Lori Bunde, MD - Assisting   ANESTHESIA:   general  EBL:  Total I/O In: 2000 [I.V.:2000] Out: 425 [Urine:175; Blood:250]  BLOOD ADMINISTERED:none  DRAINS: none   LOCAL MEDICATIONS USED:  OTHER dilute vasopressin solution  SPECIMEN:  Source of Specimen:  uterus; cervix, bilateral fallopian tubes, partial right ovary   DISPOSITION OF SPECIMEN:  PATHOLOGY  COUNTS:  YES  TOURNIQUET:  * No tourniquets in log *  DICTATION: .Note written in EPIC  PLAN OF CARE: Admit for overnight observation  PATIENT DISPOSITION:  PACU - hemodynamically stable.   Delay start of Pharmacological VTE agent (>24hrs) due to surgical blood loss or risk of bleeding: yes  Complications : none immediate   INDICATIONS: The patient is a 49 y.o. [id number] with history of symptomatic uterine fibroids/menorrhagia. The patient made a decision to undergo definite surgical treatment. On the preoperative visit, the risks, benefits, indications, and alternatives of the procedure were reviewed with the patient.  On the day of surgery, the risks of surgery were again discussed with the patient including but not limited to: bleeding which may require transfusion or reoperation; infection which may require antibiotics; injury to bowel, bladder, ureters or other surrounding organs; need for additional procedures; thromboembolic phenomenon, incisional problems and other postoperative/anesthesia complications. Written informed consent was obtained.    OPERATIVE FINDINGS: A 10 week  size uterus with normal tubes and left ovary.  There was a hemorrhagic cyst noted on the right side that appeared necrotic  DESCRIPTION OF PROCEDURE:  The patient received intravenous antibiotics and had sequential compression devices applied to her lower extremities while in the preoperative area.  She was then taken to the operating room where general anesthesia was administered and was found to be adequate.  She was placed in the dorsal lithotomy position, and was prepped and draped in a sterile manner.  The patients bladder was drained with a red rubber catheter. After an adequate timeout was performed, attention was turned to her pelvis.  A weighted speculum was then placed in the vagina, and the anterior and posterior lips of the cervix were grasped bilaterally with Lori Green.  The cervix was then injected circumferentially with a dilute Lori Green solution.  The cervix was then circumferentially incised, and the bladder was dissected off the pubocervical fascia without complication.  Th posterior cul-de-sac was entered sharply without difficulty. A suture was placed on the posterior vagina.  A long weighted speculum was inserted into the posterior cul-de-sac.  The Lori Green was then used to Green the uterosacral ligaments on either side.  They were then cut and sutured ligated with 0 Lori Green, and the ligated uterosacral ligaments were transfixed to the posterior lateral vaginal epithelium to further support the vagina and provide hemostasis. Of note, all sutures used in this case were 0 Lori Green unless otherwise noted.   The cardinal ligaments were then clamped, cut and ligated. The anterior cul-de-sac was then entered sharpely. The uterine vessels and broad ligaments were then serially clamped with the Lori Green, cut, and suture ligated on both sides.  Excellent hemostasis was noted  at this point.  Due to the size of the uterus, it was morcellated using a Lori Green.  The uterus was then  delivered via the posterior cul-de-sac, and the cornua were clamped with the Lori Green, transected, and the uterus was delivered and sent to pathology. These pedicles were then suture ligated to ensure hemostasis.  After completion of the hysterectomy, The fallopian tube on the left side was grasped with a Lori Green, then clamped with a Lori Green and transected and suture ligated.  Attention was then turned to the right fallopian tube which was grasped with the portion of the ovary that contained the hemorrhagic cyst.  This was transected and suture ligated.  Excellent hemostasis was noted.  All pedicles from the uterosacral ligament to the cornua were examined hemostasis was confirmed.  The vaginal cuff was reefed in a running locked fashion then reapproximated using figure of eight sutures.  Care was given to incorporate the uterosacral pedicles bilaterally.  All instruments were then removed from the pelvis and a vaginal packing saturated with estrogen cream was placed.  The patient tolerated the procedure well.  All instruments, needles, and sponge counts were correct x 2. The patient was taken to the recovery room in stable condition.    [name], M.D., [name]

## 2015-07-19 NOTE — Brief Op Note (Signed)
07/19/2015  9:40 AM  PATIENT:  Lori Green  49 y.o. female  PRE-OPERATIVE DIAGNOSIS:   Abnormal uterine bleeding; uterine fibroids   POST-OPERATIVE DIAGNOSIS:   Abnormal uterine bleeding; uterine fibroids; right hemorrhagic ovarian cyst  PROCEDURE:  Procedure(s): HYSTERECTOMY VAGINAL (N/A) BILATERAL SALPINGECTOMY (Bilateral) PARTIAL OOPHORECTOMY (Right)  SURGEON:  Surgeon(s) and Role:    * Lavonia Drafts, MD - Primary    * Guss Bunde, MD - Assisting   ANESTHESIA:   general  EBL:  Total I/O In: 2000 [I.V.:2000] Out: 425 [Urine:175; Blood:250]  BLOOD ADMINISTERED:none  DRAINS: none   LOCAL MEDICATIONS USED:  OTHER dilute vasopressin solution  SPECIMEN:  Source of Specimen:  uterus; cervix, bilateral fallopian tubes, partial right ovary   DISPOSITION OF SPECIMEN:  PATHOLOGY  COUNTS:  YES  TOURNIQUET:  * No tourniquets in log *  DICTATION: .Note written in EPIC  PLAN OF CARE: Admit for overnight observation  PATIENT DISPOSITION:  PACU - hemodynamically stable.   Delay start of Pharmacological VTE agent (>24hrs) due to surgical blood loss or risk of bleeding: yes  Complications : none immediate

## 2015-07-19 NOTE — H&P (Signed)
Preoperative History and Physical  Lori Green is a 49 y.o. [id number] here for surgical management of AUB.  Pt has failed medical management.   Proposed surgery: total vaginal hysterectomy with bilateral salpingectomy  Past Medical History  Diagnosis Date  . Menorrhagia   . Bartholin gland cyst   . SVD (spontaneous vaginal delivery)     x 6   Past Surgical History  Procedure Laterality Date  . Bunionectomy  06/17/12    lt  . Breast reduction surgery  04/2003  . Tubal ligation  01/1994  . Hysteroscopy with novasure N/A 03/01/2014    Procedure: HYSTEROSCOPY WITH NOVASURE;  Surgeon: Lavonia Drafts, MD;  Location: [facility];  Service: Gynecology;  Laterality: N/A;  . Carpal tunnel release Left 06/25/2014    Procedure: LEFT CARPAL TUNNEL RELEASE;  Surgeon: Daryll Brod, MD;  Location: [facility];  Service: Orthopedics;  Laterality: Left;  . Wisdom tooth extraction    . Dilatation & curettage/hysteroscopy with myosure N/A 05/10/2015    Procedure: [place];  Surgeon: Lavonia Drafts, MD;  Location: [facility];  Service: Gynecology;  Laterality: N/A;   OB History    Gravida Para Term Preterm AB TAB SAB Ectopic Multiple Living   6 6 6       6      Patient denies any cervical dysplasia or STIs. Prescriptions prior to admission  Medication Sig Dispense Refill Last Dose  . Adapalene (DIFFERIN) 0.1 % LOTN Apply 1 application topically daily.    Taking  . megestrol (MEGACE) 40 MG tablet Take 1 tablet (40 mg total) by mouth 2 (two) times daily. 60 tablet 6 Past Month at Unknown time  . Hydrocodone-Acetaminophen 5-300 MG TABS Take 1 tablet by mouth 3 (three) times daily as needed. (Patient not taking: Reported on 05/24/2015) 45 each 0 Completed Course at Unknown time  . ibuprofen (ADVIL,MOTRIN) 600 MG tablet Take 1 tablet (600 mg total) by mouth every 6 (six) hours as needed. (Patient not taking: Reported on 07/04/2015) 30 tablet 1 Not  Taking at Unknown time  . oxyCODONE-acetaminophen (PERCOCET/ROXICET) 5-325 MG per tablet Take 1-2 tablets by mouth every 6 (six) hours as needed. (Patient not taking: Reported on 07/04/2015) 30 tablet 0 Completed Course at Unknown time  . PROAIR HFA 108 (90 BASE) MCG/ACT inhaler INL 2 PFS PO QID  0 More than a month at Unknown time  . traMADol (ULTRAM) 50 MG tablet Take 1 tablet (50 mg total) by mouth every 6 (six) hours as needed. (Patient not taking: Reported on 07/04/2015) 30 tablet 0 Completed Course at Unknown time    No Known Allergies Social History:   reports that she has never smoked. She has never used smokeless tobacco. She reports that she does not drink alcohol or use illicit drugs. Family History  Problem Relation Age of Onset  . Heart disease Father   . Stroke Father   . Diabetes Son     Review of Systems: Noncontributory  PHYSICAL EXAM: Blood pressure 119/66, pulse 71, temperature 98.2 F (36.8 C), temperature source Oral, resp. rate 16, last menstrual period 06/10/2015, SpO2 99 %. General appearance - alert, well appearing, and in no distress Chest - clear to auscultation, no wheezes, rales or rhonchi, symmetric air entry Heart - normal rate and regular rhythm Abdomen - soft, nontender, nondistended, no masses or organomegaly Pelvic - examination not indicated Extremities - peripheral pulses normal, no pedal edema, no clubbing or cyanosis  Labs: Results for orders  placed or performed during the hospital encounter of 07/19/15 (from the past 336 hour(s))  Pregnancy, urine   Collection Time: 07/19/15  6:06 AM  Result Value Ref Range   Preg Test, Ur NEGATIVE NEGATIVE  Results for orders placed or performed during the hospital encounter of 07/08/15 (from the past 336 hour(s))  CBC   Collection Time: 07/08/15 11:55 AM  Result Value Ref Range   WBC 5.8 4.0 - 10.5 K/uL   RBC 4.11 3.87 - 5.11 MIL/uL   Hemoglobin 10.9 (L) 12.0 - 15.0 g/dL   HCT 33.9 (L) 36.0 - 46.0 %    MCV 82.5 78.0 - 100.0 fL   MCH 26.5 26.0 - 34.0 pg   MCHC 32.2 30.0 - 36.0 g/dL   RDW 15.8 (H) 11.5 - 15.5 %   Platelets 299 150 - 400 K/uL  Type and screen   Collection Time: 07/08/15 11:55 AM  Result Value Ref Range   ABO/RH(D) B POS    Antibody Screen NEG    Sample Expiration 07/22/2015   ABO/Rh   Collection Time: 07/08/15 11:55 AM  Result Value Ref Range   ABO/RH(D) B POS     Imaging Studies: 04/01/2015 CLINICAL DATA: Left pelvic pain for 1 month  EXAM: TRANSABDOMINAL AND TRANSVAGINAL ULTRASOUND OF PELVIS  TECHNIQUE: Both transabdominal and transvaginal ultrasound examinations of the pelvis were performed. Transabdominal technique was performed for global imaging of the pelvis including uterus, ovaries, adnexal regions, and pelvic cul-de-sac. It was necessary to proceed with endovaginal exam following the transabdominal exam to visualize the endometrium and ovaries.  COMPARISON: 08/22/2012  FINDINGS: Uterus  Measurements: 11.7 x 7.4 x 6.3 cm, anteverted, anteflexed. No fibroids or other mass visualized.  Endometrium  Thickness: Focal posterior uterine body endometrial thickening in measures 1.6 x 1.5 x 0.5 cm, silhouetted by fluid.  Right ovary  Measurements: 2.7 x 1.3 x 1.3 cm. Normal appearance/no adnexal mass.  Left ovary  Measurements: 2.7 x 1.4 x 1.1 cm. Normal appearance/no adnexal mass.  Other findings  No free fluid.  IMPRESSION: Focal posterior uterine body endometrial thickening as seen previously, highly suspicious for a polyp, although hyperplasia/neoplasia could appear similar. Submucosal fibroid is less likely. Hysteroscopy/targeted sampling is recommended.  Assessment: Patient Active Problem List   Diagnosis Date Noted  . Hypertension 09/23/2012  . Obesity 09/23/2012  . Dysfunctional uterine bleeding 09/12/2012    Plan: Patient will undergo surgical management with total vaginal hysterectomy with bilateral  salpingectomy.   The risks of surgery were discussed in detail with the patient including but not limited to: bleeding which may require transfusion or reoperation; infection which may require antibiotics; injury to surrounding organs which may involve bowel, bladder, ureters ; need for additional procedures including laparoscopy or laparotomy; thromboembolic phenomenon, surgical site problems and other postoperative/anesthesia complications. Likelihood of success in alleviating the patient's condition was discussed. Routine postoperative instructions will be reviewed with the patient and her family in detail after surgery.  The patient concurred with the proposed plan, giving informed written consent for the surgery.  Patient has been NPO since last night she will remain NPO for procedure.  Anesthesia and OR aware.  Preoperative prophylactic antibiotics and SCDs ordered on call to the OR.  To OR when ready.  Eura Radabaugh L. Harraway-Smith, M.D., [facility] 07/19/2015 7:09 AM

## 2015-07-19 NOTE — Transfer of Care (Signed)
Immediate Anesthesia Transfer of Care Note  Patient: Lori Green  Procedure(s) Performed: Procedure(s): HYSTERECTOMY VAGINAL (N/A) BILATERAL SALPINGECTOMY (Bilateral) PARTIAL OOPHORECTOMY (Right)  Patient Location: PACU  Anesthesia Type:General  Level of Consciousness: awake, alert  and oriented  Airway & Oxygen Therapy: Patient Spontanous Breathing and Patient connected to nasal cannula oxygen  Post-op Assessment: Report given to RN and Post -op Vital signs reviewed and stable  Post vital signs: Reviewed and stable  Last Vitals:  Filed Vitals:   07/19/15 0629  BP: 119/66  Pulse: 71  Temp: 36.8 C  Resp: 16    Complications: No apparent anesthesia complications

## 2015-07-19 NOTE — Anesthesia Procedure Notes (Signed)
Procedure Name: Intubation Date/Time: 07/19/2015 7:33 AM Performed by: Flossie Dibble Pre-anesthesia Checklist: Patient being monitored, Suction available, Emergency Drugs available, Patient identified and Timeout performed Patient Re-evaluated:Patient Re-evaluated prior to inductionOxygen Delivery Method: Circle system utilized Preoxygenation: Pre-oxygenation with 100% oxygen Intubation Type: IV induction Ventilation: Mask ventilation without difficulty Laryngoscope Size: Mac and 3 Grade View: Grade I Tube type: Oral Tube size: 7.0 mm Number of attempts: 1 Airway Equipment and Method: Patient positioned with wedge pillow and Stylet Placement Confirmation: ETT inserted through vocal cords under direct vision,  positive ETCO2 and breath sounds checked- equal and bilateral Secured at: 21 cm Tube secured with: Tape Dental Injury: Teeth and Oropharynx as per pre-operative assessment

## 2015-07-19 NOTE — Anesthesia Postprocedure Evaluation (Signed)
  Anesthesia Post-op Note  Patient: Lori Green  Procedure(s) Performed: Procedure(s): HYSTERECTOMY VAGINAL (N/A) BILATERAL SALPINGECTOMY (Bilateral) PARTIAL OOPHORECTOMY (Right)  Patient Location: PACU  Anesthesia Type:General  Level of Consciousness: awake, alert  and oriented  Airway and Oxygen Therapy: Patient Spontanous Breathing and Patient connected to nasal cannula oxygen  Post-op Pain: mild  Post-op Assessment: Post-op Vital signs reviewed, Patient's Cardiovascular Status Stable, Respiratory Function Stable, Patent Airway, No signs of Nausea or vomiting and Pain level controlled              Post-op Vital Signs: Reviewed and stable  Last Vitals:  Filed Vitals:   07/19/15 1000  BP: 128/66  Pulse: 67  Temp:   Resp: 12    Complications: No apparent anesthesia complications

## 2015-07-19 NOTE — Anesthesia Preprocedure Evaluation (Addendum)
Anesthesia Evaluation  Patient identified by MRN, date of birth, ID band Patient awake    Reviewed: Allergy & Precautions, NPO status , Patient's Chart, lab work & pertinent test results  Airway Mallampati: II  TM Distance: >3 FB Neck ROM: Full    Dental  (+) Teeth Intact   Pulmonary neg pulmonary ROS,    Pulmonary exam normal breath sounds clear to auscultation       Cardiovascular hypertension, Normal cardiovascular exam Rhythm:Regular Rate:Normal     Neuro/Psych negative neurological ROS  negative psych ROS   GI/Hepatic negative GI ROS, Neg liver ROS,   Endo/Other  Obesity  Renal/GU negative Renal ROS  negative genitourinary   Musculoskeletal negative musculoskeletal ROS (+)   Abdominal (+) + obese,   Peds  Hematology  (+) anemia ,   Anesthesia Other Findings   Reproductive/Obstetrics AUB Menorrhagia                            Anesthesia Physical Anesthesia Plan  ASA: II  Anesthesia Plan: General   Post-op Pain Management:    Induction: Intravenous  Airway Management Planned: Oral ETT  Additional Equipment:   Intra-op Plan:   Post-operative Plan: Extubation in OR  Informed Consent: I have reviewed the patients History and Physical, chart, labs and discussed the procedure including the risks, benefits and alternatives for the proposed anesthesia with the patient or authorized representative who has indicated his/her understanding and acceptance.   Dental advisory given  Plan Discussed with: CRNA, Anesthesiologist and Surgeon  Anesthesia Plan Comments:         Anesthesia Quick Evaluation

## 2015-07-20 ENCOUNTER — Encounter (HOSPITAL_COMMUNITY): Payer: Self-pay | Admitting: Obstetrics & Gynecology

## 2015-07-20 DIAGNOSIS — N888 Other specified noninflammatory disorders of cervix uteri: Secondary | ICD-10-CM | POA: Diagnosis not present

## 2015-07-20 LAB — CBC
HEMATOCRIT: 31.3 % — AB (ref 36.0–46.0)
Hemoglobin: 10 g/dL — ABNORMAL LOW (ref 12.0–15.0)
MCH: 26.5 pg (ref 26.0–34.0)
MCHC: 31.9 g/dL (ref 30.0–36.0)
MCV: 82.8 fL (ref 78.0–100.0)
Platelets: 331 10*3/uL (ref 150–400)
RBC: 3.78 MIL/uL — AB (ref 3.87–5.11)
RDW: 15.6 % — AB (ref 11.5–15.5)
WBC: 12.8 10*3/uL — AB (ref 4.0–10.5)

## 2015-07-20 MED ORDER — OXYCODONE-ACETAMINOPHEN 5-325 MG PO TABS: 1.0000 | ORAL_TABLET | ORAL | Status: DC | PRN

## 2015-07-20 MED ORDER — BISACODYL 10 MG RE SUPP
10.0000 mg | Freq: Once | RECTAL | Status: AC
  Administered 2015-07-20: 10 mg via RECTAL
  Filled 2015-07-20: qty 1

## 2015-07-20 MED ORDER — IBUPROFEN 600 MG PO TABS: 600.0000 mg | ORAL_TABLET | Freq: Four times a day (QID) | ORAL | Status: DC | PRN

## 2015-07-20 MED ORDER — IBUPROFEN 600 MG PO TABS: 600.0000 mg | ORAL_TABLET | Freq: Four times a day (QID) | ORAL | Status: DC

## 2015-07-20 MED ORDER — POLYETHYLENE GLYCOL 3350 17 G PO PACK
34.0000 g | PACK | Freq: Once | ORAL | Status: DC
  Filled 2015-07-20: qty 2

## 2015-07-20 MED ORDER — DOCUSATE SODIUM 100 MG PO CAPS: 100.0000 mg | ORAL_CAPSULE | Freq: Two times a day (BID) | ORAL | Status: DC

## 2015-07-20 NOTE — Progress Notes (Signed)
Pt is discharged in the care of Daughter. Denies pain or discomfort.Bleeding is scanty,Discharged instructions with Rx were given to pt and family.Understands all instructions well. Questions asked and answered. No equipment needed for home use.

## 2015-07-20 NOTE — Addendum Note (Signed)
Addendum  created 07/20/15 1144 by Georgeanne Nim, CRNA   Modules edited: Notes Section   Notes Section:  File: [id number]

## 2015-07-20 NOTE — Discharge Instructions (Signed)
Total Laparoscopic Hysterectomy, Care After °Refer to this sheet in the next few weeks. These instructions provide you with information on caring for yourself after your procedure. Your health care provider may also give you more specific instructions. Your treatment has been planned according to current medical practices, but problems sometimes occur. Call your health care provider if you have any problems or questions after your procedure. °WHAT TO EXPECT AFTER THE PROCEDURE °· Pain and bruising at the incision sites. You will be given pain medicine to control it. °· Menopausal symptoms such as hot flashes, night sweats, and insomnia if your ovaries were removed. °· Sore throat from the breathing tube that was inserted during surgery. °HOME CARE INSTRUCTIONS °· Only take over-the-counter or prescription medicines for pain, discomfort, or fever as directed by your health care provider.   °· Do not take aspirin. It can cause bleeding.   °· Do not drive when taking pain medicine.   °· Follow your health care provider's advice regarding diet, exercise, lifting, driving, and general activities.   °· Resume your usual diet as directed and allowed.   °· Get plenty of rest and sleep.   °· Do not douche, use tampons, or have sexual intercourse for at least 6 weeks, or until your health care provider gives you permission.   °· Change your bandages (dressings) as directed by your health care provider.   °· Monitor your temperature and notify your health care provider of a fever.   °· Take showers instead of baths for 2-3 weeks.   °· Do not drink alcohol until your health care provider gives you permission.   °· If you develop constipation, you may take a mild laxative with your health care provider's permission. Bran foods may help with constipation problems. Drinking enough fluids to keep your urine clear or pale yellow may help as well.   °· Try to have someone home with you for 1-2 weeks to help around the house.    °· Keep all of your follow-up appointments as directed by your health care provider.   °SEEK MEDICAL CARE IF: °· You have swelling, redness, or increasing pain around your incision sites.   °· You have pus coming from your incision.   °· You notice a bad smell coming from your incision.   °· Your incision breaks open.   °· You feel dizzy or lightheaded.   °· You have pain or bleeding when you urinate.   °· You have persistent diarrhea.   °· You have persistent nausea and vomiting.   °· You have abnormal vaginal discharge.   °· You have a rash.   °· You have any type of abnormal reaction or develop an allergy to your medicine.   °· You have poor pain control with your prescribed medicine.   °SEEK IMMEDIATE MEDICAL CARE IF: °· You have chest pain or shortness of breath. °· You have severe abdominal pain that is not relieved with pain medicine. °· You have pain or swelling in your legs. °MAKE SURE YOU: °· Understand these instructions. °· Will watch your condition. °· Will get help right away if you are not doing well or get worse. °Document Released: 08/12/2013 Document Revised: 10/27/2013 Document Reviewed: 08/12/2013 °Exit[employer]® Patient Information ©2015 [employer]. This information is not intended to replace advice given to you by your health care provider. Make sure you discuss any questions you have with your health care provider. ° °

## 2015-07-20 NOTE — Anesthesia Postprocedure Evaluation (Signed)
  Anesthesia Post-op Note  Patient: Lori Green  Procedure(s) Performed: Procedure(s): HYSTERECTOMY VAGINAL (N/A) BILATERAL SALPINGECTOMY (Bilateral) PARTIAL OOPHORECTOMY (Right)  Patient Location: [facility]  Anesthesia Type:General  Level of Consciousness: awake, alert , oriented and patient cooperative  Airway and Oxygen Therapy: Patient Spontanous Breathing  Post-op Pain: mild  Post-op Assessment: Patient's Cardiovascular Status Stable, Respiratory Function Stable, No signs of Nausea or vomiting, Adequate PO intake and Pain level controlled              Post-op Vital Signs: Reviewed and stable  Last Vitals:  Filed Vitals:   07/20/15 1000  BP: 113/57  Pulse: 79  Temp: 36.8 C  Resp: 18    Complications: No apparent anesthesia complications

## 2015-07-20 NOTE — Discharge Summary (Signed)
Physician Discharge Summary  [name] MRN: [id number] DOB/AGE: 04/22/1966 49 y.o.  Admit date: 07/19/2015 Discharge date: 07/20/2015  Admission Diagnoses: AUB  Discharge Diagnoses:  Principal Problem:   Dysfunctional uterine bleeding Active Problems:   Hypertension   Obesity   Hemorrhagic ovarian cyst   Postoperative state   Discharged Condition: good  Hospital Course: Patient had an uncomplicated surgery; for further details of this surgery, please refer to the operative note. Furthermore, the patient had an uncomplicated postoperative course.  By time of discharge, her pain was controlled on oral pain medications; she was ambulating, voiding without difficulty and tolerating regular diet.  She was deemed stable for discharge to home after she is able to pass flatus.       Consults: None  Significant Diagnostic Studies: labs: CBC  Treatments: procedures: Total vaginal hysterectomy with bilateral salpingectomy and partial right ophorectomy  Discharge Exam: Blood pressure 113/57, pulse 79, temperature 98.2 F (36.8 C), temperature source Oral, resp. rate 18, height 5' 4.5" (1.638 m), weight 220 lb (99.791 kg), last menstrual period 06/10/2015, SpO2 100 %. General appearance: alert and no distress Resp: clear to auscultation bilaterally Cardio: regular rate and rhythm, S1, S2 normal, no murmur, click, rub or gallop GI: soft, non-tender; bowel sounds normal; no masses,  no organomegaly Extremities: extremities normal, atraumatic, no cyanosis or edema  CBC    Component Value Date/Time   WBC 12.8* 07/20/2015 0610   RBC 3.78* 07/20/2015 0610   HGB 10.0* 07/20/2015 0610   HCT 31.3* 07/20/2015 0610   PLT 331 07/20/2015 0610   MCV 82.8 07/20/2015 0610   MCH 26.5 07/20/2015 0610   MCHC 31.9 07/20/2015 0610   RDW 15.6* 07/20/2015 0610   LYMPHSABS 2.2 09/15/2009 1456   MONOABS 0.4 09/15/2009 1456   EOSABS 0.0 09/15/2009 1456   BASOSABS 0.0 09/15/2009 1456     Disposition: 01-Home or Self Care  Discharge Instructions    Call MD for:  difficulty breathing, headache or visual disturbances    Complete by:  As directed      Call MD for:  extreme fatigue    Complete by:  As directed      Call MD for:  hives    Complete by:  As directed      Call MD for:  persistant dizziness or light-headedness    Complete by:  As directed      Call MD for:  persistant nausea and vomiting    Complete by:  As directed      Call MD for:  severe uncontrolled pain    Complete by:  As directed      Call MD for:  temperature >100.4    Complete by:  As directed      Diet - low sodium heart healthy    Complete by:  As directed      Driving Restrictions    Complete by:  As directed   No driving for 2 weeks     Increase activity slowly    Complete by:  As directed      Lifting restrictions    Complete by:  As directed   No lifting for 4 weeks     Sexual Activity Restrictions    Complete by:  As directed   Nothing in vagina for 6 weeks.  No intercourse for 6 weeks.            Medication List    STOP taking these medications  Hydrocodone-Acetaminophen 5-300 MG Tabs     traMADol 50 MG tablet  Commonly known as:  ULTRAM      TAKE these medications        DIFFERIN 0.1 % Lotn  Generic drug:  Adapalene  Apply 1 application topically daily.     docusate sodium 100 MG capsule  Commonly known as:  COLACE  Take 1 capsule (100 mg total) by mouth 2 (two) times daily.     ibuprofen 600 MG tablet  Commonly known as:  ADVIL,MOTRIN  Take 1 tablet (600 mg total) by mouth every 6 (six) hours.     oxyCODONE-acetaminophen 5-325 MG per tablet  Commonly known as:  PERCOCET/ROXICET  Take 1-2 tablets by mouth every 4 (four) hours as needed (moderate to severe pain (when tolerating fluids)).     PROAIR HFA 108 (90 BASE) MCG/ACT inhaler  Generic drug:  albuterol  INL 2 PFS PO QID           Follow-up Information    Follow up with Green,  Lori Slaubaugh, MD In 2 weeks.   Specialty:  Obstetrics and Gynecology   Contact information:   [place] [address] [phone]       Signed: Lavonia Drafts 07/20/2015, 1:59 PM

## 2015-08-05 ENCOUNTER — Ambulatory Visit (INDEPENDENT_AMBULATORY_CARE_PROVIDER_SITE_OTHER): Payer: Managed Care, Other (non HMO) | Admitting: Obstetrics & Gynecology

## 2015-08-05 ENCOUNTER — Encounter: Payer: Self-pay | Admitting: Obstetrics & Gynecology

## 2015-08-05 VITALS — BP 139/73 | HR 79 | Ht 64.5 in | Wt 217.9 lb

## 2015-08-05 DIAGNOSIS — Z9889 Other specified postprocedural states: Secondary | ICD-10-CM

## 2015-08-05 DIAGNOSIS — K5900 Constipation, unspecified: Secondary | ICD-10-CM

## 2015-08-05 MED ORDER — LINACLOTIDE 290 MCG PO CAPS: 290.0000 ug | ORAL_CAPSULE | Freq: Every day | ORAL | Status: DC

## 2015-08-05 MED ORDER — MAGNESIUM CITRATE PO SOLN: 1.0000 | Freq: Once | ORAL | Status: DC

## 2015-08-05 NOTE — Progress Notes (Signed)
[name], female   DOB: 02-12-1966, 49 y.o.   MRN: [id number] History:  49 y.o. [id number] here today for 2 weeks post op check. Pt c/o lower abd discomfort.  Has had only minimal BM that are hard and of a small quantity.  She has a h/o constipation and was on Linzess and ran out. Has not seen GI doc recently.  She denies fever or chills. No vaginal bleeding or foul smelling discharge.  Voiding without difficulty. Passing flatus.  The following portions of the patient's history were reviewed and updated as appropriate: allergies, current medications, past family history, past medical history, past social history, past surgical history and problem list.  Review of Systems:  Pertinent items are noted in HPI.  Objective:  Physical Exam Blood pressure 139/73, pulse 79, height 5' 4.5" (1.638 m), weight 217 lb 14.4 oz (98.839 kg). Temp 99 Gen: NAD Abd: Soft, nontender and nondistended Pelvic: Normal appearing external genitalia; normal appearing vaginal mucosa and cervix.  Normal discharge.  Small uterus, no other palpable masses, no uterine or adnexal tenderness  Labs and Imaging Diagnosis Uterus and bilateral fallopian tubes, with portion of right ovary - CERVIX: NABOTHIAN CYST. - ENDOMETRIUM: INACTIVE WITH FOCAL BREAKDOWN AND BENIGN ENDOMETRIAL POLYP. NO EVIDENCE OF HYPERPLASIA OR CARCINOMA. - MYOMETRIUM: LEIOMYOMATA. - BILATERAL FALLOPIAN TUBES: BENIGN PARATUBAL CYST. NO ENDOMETRIOSIS OR MALIGNANCY. - PARTIALLY CYSTIC HEMORRHAGIC TISSUE: BENIGN OVARIAN TISSUE WITH HEMORRHAGIC CYST. NO ENDOMETRIOSIS OR EVIDENCE OF MALIGNANCY.  Assessment & Plan:  2 weeks post op check. Some lower abd pain.  suspect due to constipation.  Will treat constipation. Pt to call if sx persist after bowel clearance.   Constipation- chronic Magnesium citrate 1 bottle F/u with GI physician Linzess daily F/u in 4 weeks with Dr. Harolyn Rutherford or sooner prn  Hoyle Sauer L. Harraway-Smith, M.D., [name]

## 2015-08-05 NOTE — Patient Instructions (Addendum)
Magnesium Citrate oral solution What is this medicine? MAGNESIUM CITRATE (mag NEE zee um SI treyt) is a saline laxative. It is used to treat occasional constipation, but it should not be used regularly for this purpose. This medicine may be used for other purposes; ask your health care provider or pharmacist if you have questions. COMMON BRAND NAME(S): Citroma What should I tell my health care provider before I take this medicine? They need to know if you have any of these conditions: -are on a low magnesium or low sodium diet -change in bowel habits for 2 weeks -colostomy or ileostomy -constipation after using another laxative for 7 days -diabetes -kidney disease -rectal bleeding -stomach pain, nausea, or vomiting -an unusual or allergic reaction to magnesium citrate, other magnesium products, other medicines, foods, dyes, or preservatives -pregnant or trying to get pregnant -breast-feeding How should I use this medicine? Take this medicine by mouth. Follow the directions on the package or prescription label. Use a specially marked spoon or container to measure each dose. Ask your pharmacist if you do not have one. Household spoons are not accurate. Drink a full glass of fluid with each dose of this medicine. This medicine may taste better if it is chilled before you drink it. Do not take your medicine more often than directed. Talk to your pediatrician regarding the use of this medicine in children. While this drug may be prescribed for children as young as 44 years of age for selected conditions, precautions do apply. Overdosage: If you think you have taken too much of this medicine contact a poison control center or emergency room at once. NOTE: This medicine is only for you. Do not share this medicine with others. What if I miss a dose? This does not apply; this medicine is not for regular use. What may interact with this medicine? -cellulose sodium phosphate -digoxin -edetate  disodium, EDTA -medicines for bone strength like etidronate, ibandronate, risedronate -sodium polystyrene sulfonate -some antibiotics like ciprofloxacin, doxycycline, gatifloxacin, levofloxacin, tetracycline -vitamin D This list may not describe all possible interactions. Give your health care provider a list of all the medicines, herbs, non-prescription drugs, or dietary supplements you use. Also tell them if you smoke, drink alcohol, or use illegal drugs. Some items may interact with your medicine. What should I watch for while using this medicine? Tell your doctor or healthcare professional if your symptoms do not start to get better or if they get worse. Do not take any other medicine by mouth within 2 hours of taking this medicine. What side effects may I notice from receiving this medicine? Side effects that you should report to your doctor or health care professional as soon as possible: -allergic reactions like skin rash, itching or hives, swelling of the face, lips, or tongue -breathing problems -chest pain -fast, irregular heartbeat -muscle weakness -nausea or vomiting Side effects that usually do not require medical attention (report to your doctor or health care professional if they continue or are bothersome): -diarrhea -stomach upset This list may not describe all possible side effects. Call your doctor for medical advice about side effects. You may report side effects to FDA at 1-800-FDA-1088. Where should I keep my medicine? Keep out of the reach of children. Store at room temperature or in the refrigerator between 8 and 30 degrees C (46 and 86 degrees F). Throw away any unused medicine 24 hours after opening the bottle. Throw away unopened bottles of medicine after the expiration date. NOTE: This sheet is a  summary. It may not cover all possible information. If you have questions about this medicine, talk to your doctor, pharmacist, or health care provider.  2015,  Elsevier/Gold Standard. (2008-05-11 17:27:50) Total Laparoscopic Hysterectomy, Care After Refer to this sheet in the next few weeks. These instructions provide you with information on caring for yourself after your procedure. Your health care provider may also give you more specific instructions. Your treatment has been planned according to current medical practices, but problems sometimes occur. Call your health care provider if you have any problems or questions after your procedure. WHAT TO EXPECT AFTER THE PROCEDURE  Pain and bruising at the incision sites. You will be given pain medicine to control it.  Menopausal symptoms such as hot flashes, night sweats, and insomnia if your ovaries were removed.  Sore throat from the breathing tube that was inserted during surgery. HOME CARE INSTRUCTIONS  Only take over-the-counter or prescription medicines for pain, discomfort, or fever as directed by your health care provider.   Do not take aspirin. It can cause bleeding.   Do not drive when taking pain medicine.   Follow your health care provider's advice regarding diet, exercise, lifting, driving, and general activities.   Resume your usual diet as directed and allowed.   Get plenty of rest and sleep.   Do not douche, use tampons, or have sexual intercourse for at least 6 weeks, or until your health care provider gives you permission.   Change your bandages (dressings) as directed by your health care provider.   Monitor your temperature and notify your health care provider of a fever.   Take showers instead of baths for 2-3 weeks.   Do not drink alcohol until your health care provider gives you permission.   If you develop constipation, you may take a mild laxative with your health care provider's permission. Bran foods may help with constipation problems. Drinking enough fluids to keep your urine clear or pale yellow may help as well.   Try to have someone home with you  for 1-2 weeks to help around the house.   Keep all of your follow-up appointments as directed by your health care provider.  SEEK MEDICAL CARE IF:  You have swelling, redness, or increasing pain around your incision sites.   You have pus coming from your incision.   You notice a bad smell coming from your incision.   Your incision breaks open.   You feel dizzy or lightheaded.   You have pain or bleeding when you urinate.   You have persistent diarrhea.   You have persistent nausea and vomiting.   You have abnormal vaginal discharge.   You have a rash.   You have any type of abnormal reaction or develop an allergy to your medicine.   You have poor pain control with your prescribed medicine.  SEEK IMMEDIATE MEDICAL CARE IF:  You have chest pain or shortness of breath.  You have severe abdominal pain that is not relieved with pain medicine.  You have pain or swelling in your legs. MAKE SURE YOU:  Understand these instructions.  Will watch your condition.  Will get help right away if you are not doing well or get worse. Document Released: 08/12/2013 Document Revised: 10/27/2013 Document Reviewed: 08/12/2013 [facility] Patient Information 2015 [place], [place]. This information is not intended to replace advice given to you by your health care provider. Make sure you discuss any questions you have with your health care provider.

## 2015-08-17 ENCOUNTER — Telehealth: Payer: Self-pay | Admitting: General Practice

## 2015-08-17 DIAGNOSIS — T402X5A Adverse effect of other opioids, initial encounter: Principal | ICD-10-CM

## 2015-08-17 DIAGNOSIS — K5903 Drug induced constipation: Secondary | ICD-10-CM

## 2015-08-17 MED ORDER — LUBIPROSTONE 24 MCG PO CAPS: 24.0000 ug | ORAL_CAPSULE | Freq: Two times a day (BID) | ORAL | Status: DC

## 2015-08-17 MED ORDER — LACTULOSE 10 G PO PACK: 10.0000 g | PACK | Freq: Two times a day (BID) | ORAL | Status: DC

## 2015-08-17 NOTE — Telephone Encounter (Signed)
Patient called and left message stating Dr Ihor Dow prescribed Linzess for her severe constipation following her hysterectomy. Patient states her insurance will not cover that but will cover amitiza and lactulose. She guesses they want her to take both of those. Patient states she is still having severe constipation, bloating, pressure and pain and would like these medications called in. Spoke to [place], who authorized prescriptions and recommended starting with the amitiza and then adding lactulose the next day if she feels the [place] hasn't helped. Patient verbalized understanding and had no questions

## 2015-08-22 ENCOUNTER — Telehealth: Payer: Self-pay | Admitting: *Deleted

## 2015-08-22 NOTE — Telephone Encounter (Signed)
Lori Green called and left a message that she is calling about needing [place] to call her insurance to get preauthorization for her medicine amitiza. States lactulose is not working by itself- she is still having a lot of pain and constipation. Would like someone to call her.

## 2015-08-23 NOTE — Telephone Encounter (Signed)
Received a fax from [facility] on [address] ph [phone], fax [phone] stating need preauth for Amitiza 77mcg capsules . Call pt. Plan at [phone] to initate prior authorization . Pt. ID is  [id number].

## 2015-08-24 ENCOUNTER — Encounter: Payer: Self-pay | Admitting: Obstetrics & Gynecology

## 2015-08-24 ENCOUNTER — Ambulatory Visit (INDEPENDENT_AMBULATORY_CARE_PROVIDER_SITE_OTHER): Payer: Managed Care, Other (non HMO) | Admitting: Obstetrics & Gynecology

## 2015-08-24 VITALS — BP 157/77 | HR 77 | Temp 98.3°F | Ht 64.0 in | Wt 218.0 lb

## 2015-08-24 DIAGNOSIS — Z1231 Encounter for screening mammogram for malignant neoplasm of breast: Secondary | ICD-10-CM

## 2015-08-24 DIAGNOSIS — Z90710 Acquired absence of both cervix and uterus: Secondary | ICD-10-CM

## 2015-08-24 DIAGNOSIS — Z09 Encounter for follow-up examination after completed treatment for conditions other than malignant neoplasm: Secondary | ICD-10-CM

## 2015-08-24 NOTE — Progress Notes (Signed)
CLINIC ENCOUNTER NOTE  History:  49 y.o. [id number] here today for postoperative check s/p TVH, BS on 07/19/15 for AUB, fibroids. She denies any abnormal vaginal discharge, bleeding, pelvic pain or other concerns.  Due to return to work on 08/29/15, wants work Quarry manager.  Past Medical History  Diagnosis Date  . Menorrhagia   . Bartholin gland cyst   . SVD (spontaneous vaginal delivery)     x 6    Past Surgical History  Procedure Laterality Date  . Bunionectomy  06/17/12    lt  . Breast reduction surgery  04/2003  . Tubal ligation  01/1994  . Hysteroscopy with novasure N/A 03/01/2014    Procedure: HYSTEROSCOPY WITH NOVASURE;  Surgeon: Lavonia Drafts, MD;  Location: [facility];  Service: Gynecology;  Laterality: N/A;  . Carpal tunnel release Left 06/25/2014    Procedure: LEFT CARPAL TUNNEL RELEASE;  Surgeon: Daryll Brod, MD;  Location: [facility];  Service: Orthopedics;  Laterality: Left;  . Wisdom tooth extraction    . Dilatation & curettage/hysteroscopy with myosure N/A 05/10/2015    Procedure: [place];  Surgeon: Lavonia Drafts, MD;  Location: [facility];  Service: Gynecology;  Laterality: N/A;  . Vaginal hysterectomy N/A 07/19/2015    Procedure: HYSTERECTOMY VAGINAL;  Surgeon: Lavonia Drafts, MD;  Location: [facility];  Service: Gynecology;  Laterality: N/A;  . Bilateral salpingectomy Bilateral 07/19/2015    Procedure: BILATERAL SALPINGECTOMY;  Surgeon: Lavonia Drafts, MD;  Location: [facility];  Service: Gynecology;  Laterality: Bilateral;  . Oophorectomy Right 07/19/2015    Procedure: PARTIAL OOPHORECTOMY;  Surgeon: Lavonia Drafts, MD;  Location: [facility];  Service: Gynecology;  Laterality: Right;    The following portions of the patient's history were reviewed and updated as appropriate: allergies, current   Review of Systems:  Pertinent items noted in HPI and remainder of comprehensive ROS otherwise  negative.  Objective:  Physical Exam BP 157/77 mmHg  Pulse 77  Temp(Src) 98.3 F (36.8 C)  Ht 5\' 4"  (1.626 m)  Wt 218 lb (98.884 kg)  BMI 37.40 kg/m2  LMP 05/27/2015 CONSTITUTIONAL: Well-developed, well-nourished female in no acute distress.  HENT:  Normocephalic, atraumatic. External right and left ear normal. Oropharynx is clear and moist EYES: Conjunctivae and EOM are normal. Pupils are equal, round, and reactive to light. No scleral icterus.  NECK: Normal range of motion, supple, no masses SKIN: Skin is warm and dry. No rash noted. Not diaphoretic. No erythema. No pallor. [place]: Alert and oriented to person, place, and time. Normal reflexes, muscle tone coordination. No cranial nerve deficit noted. PSYCHIATRIC: Normal mood and affect. Normal behavior. Normal judgment and thought content. CARDIOVASCULAR: Normal heart rate noted RESPIRATORY: Effort and breath sounds normal, no problems with respiration noted ABDOMEN: Soft, no distention noted.   PELVIC: Normal appearing external genitalia; normal appearing vaginal mucosa and well-healing vaginal cuff.  No abnormal discharge noted.   MUSCULOSKELETAL: Normal range of motion. No edema noted.  Pathology Diagnosis Uterus and bilateral fallopian tubes, with portion of right ovary - CERVIX: NABOTHIAN CYST. - ENDOMETRIUM: INACTIVE WITH FOCAL BREAKDOWN AND BENIGN ENDOMETRIAL POLYP. NO EVIDENCE OF HYPERPLASIA OR CARCINOMA. - MYOMETRIUM: LEIOMYOMATA. - BILATERAL FALLOPIAN TUBES: BENIGN PARATUBAL CYST. NO ENDOMETRIOSIS OR MALIGNANCY. - PARTIALLY CYSTIC HEMORRHAGIC TISSUE: BENIGN OVARIAN TISSUE WITH HEMORRHAGIC CYST. NO ENDOMETRIOSIS OR EVIDENCE OF MALIGNANCY.  Assessment & Plan:  1. Postop check Normal postoperative check. Benign pathology reviewed. Work Quarry manager provided  2. Encounter for screening mammogram for breast cancer - MM  DIGITAL SCREENING BILATERAL; Future ordered since she has not had one in several years   Verita Schneiders, MD, [place] Attending Obstetrician & Gynecologist, [place] for [facility]

## 2015-08-25 NOTE — Telephone Encounter (Signed)
Called  [facility] to ask if Amitiza still needed prior authorization-which they verified it does. [employer] and received authorization for Amitiza. Called [facility] and notified them it has been approved. Called Lori Green and notified her Amitiza has been approved and I notified [facility]- it should be ready in a few hours.

## 2015-09-01 ENCOUNTER — Ambulatory Visit: Payer: Managed Care, Other (non HMO)

## 2015-12-15 ENCOUNTER — Ambulatory Visit: Payer: Managed Care, Other (non HMO) | Admitting: Obstetrics & Gynecology

## 2015-12-16 ENCOUNTER — Telehealth: Payer: Self-pay

## 2015-12-16 NOTE — Telephone Encounter (Signed)
Pt missed appointment Dr.Harraway-Smith wanted [place] to check on patient. I called and left  message for patient to follow up with the office in regards scheduling appt.

## 2015-12-20 ENCOUNTER — Emergency Department (INDEPENDENT_AMBULATORY_CARE_PROVIDER_SITE_OTHER)
Admission: EM | Admit: 2015-12-20 | Discharge: 2015-12-20 | Disposition: A | Discharge status: Home / Self Care | Payer: 59 | Source: Home / Self Care | Attending: Emergency Medicine | Admitting: Emergency Medicine

## 2015-12-20 ENCOUNTER — Encounter (HOSPITAL_COMMUNITY): Payer: Self-pay | Admitting: Emergency Medicine

## 2015-12-20 DIAGNOSIS — J209 Acute bronchitis, unspecified: Secondary | ICD-10-CM

## 2015-12-20 MED ORDER — IPRATROPIUM-ALBUTEROL 0.5-2.5 (3) MG/3ML IN SOLN
3.0000 mL | Freq: Once | RESPIRATORY_TRACT | Status: AC
  Administered 2015-12-20: 3 mL via RESPIRATORY_TRACT

## 2015-12-20 MED ORDER — ALBUTEROL SULFATE HFA 108 (90 BASE) MCG/ACT IN AERS: 2.0000 | INHALATION_SPRAY | RESPIRATORY_TRACT | Status: DC | PRN

## 2015-12-20 MED ORDER — FLUCONAZOLE 150 MG PO TABS: 150.0000 mg | ORAL_TABLET | Freq: Once | ORAL | Status: DC

## 2015-12-20 MED ORDER — METHYLPREDNISOLONE SODIUM SUCC 125 MG IJ SOLR
125.0000 mg | Freq: Once | INTRAMUSCULAR | Status: AC
  Administered 2015-12-20: 125 mg via INTRAMUSCULAR

## 2015-12-20 MED ORDER — METHYLPREDNISOLONE SODIUM SUCC 125 MG IJ SOLR
INTRAMUSCULAR | Status: AC
  Filled 2015-12-20: qty 2

## 2015-12-20 MED ORDER — HYDROCODONE-HOMATROPINE 5-1.5 MG/5ML PO SYRP: 5.0000 mL | ORAL_SOLUTION | Freq: Four times a day (QID) | ORAL | Status: DC | PRN

## 2015-12-20 MED ORDER — AZITHROMYCIN 250 MG PO TABS: ORAL_TABLET | ORAL | Status: DC

## 2015-12-20 MED ORDER — IPRATROPIUM-ALBUTEROL 0.5-2.5 (3) MG/3ML IN SOLN
RESPIRATORY_TRACT | Status: AC
  Filled 2015-12-20: qty 3

## 2015-12-20 MED ORDER — PREDNISONE 50 MG PO TABS: ORAL_TABLET | ORAL | Status: DC

## 2015-12-20 NOTE — Discharge Instructions (Signed)
You have bronchitis. °Take azithromycin and prednisone as prescribed. °Use hycodan as needed for cough. °Use the albuterol every 4 hours as needed for wheezing or cough. °You should see improvement in the next 3-5 days. °If you develop fevers, difficulty breathing, or are just not getting better, please come back or go to the emergency room. ° °

## 2015-12-20 NOTE — ED Notes (Signed)
Patient reports a one week history of cough, tightness in chest and wheezing.  Unknown fever, but has chills at night.  Complains of pain in mid back-aching.  Initially phlegm was clear to yellow,but now patient is unable to get any phlegm up, just rattles in chest

## 2015-12-20 NOTE — ED Provider Notes (Signed)
CSN: [id number]     Arrival date & time 12/20/15  1305 History   First MD Initiated Contact with Patient 12/20/15 1333     Chief Complaint  Patient presents with  . Cough   (Consider location/radiation/quality/duration/timing/severity/associated sxs/prior Treatment) HPI  She is a 50 year old woman here for evaluation of cough. She states this is been going on for about a week now. Initially she reports a cough productive of clear sputum. The sputum changed to yellow, but then over the last few days she has not been able to get up any sputum. She also reports wheezing over the last day or 2. She describes a feeling of tightness in her chest as well as some intermittent shortness of breath. She reports subjective fevers and chills, primarily at night. No nasal congestion or rhinorrhea. She does report some throat irritation in the last 24 hours. No nausea or vomiting. She has tried multiple over-the-counter cough and cold medications without improvement. No history of asthma, but states she was treated with an inhaler 3 times last year for bronchitis.   Past Medical History  Diagnosis Date  . Menorrhagia   . Bartholin gland cyst   . SVD (spontaneous vaginal delivery)     x 6   Past Surgical History  Procedure Laterality Date  . Bunionectomy  06/17/12    lt  . Breast reduction surgery  04/2003  . Tubal ligation  01/1994  . Hysteroscopy with novasure N/A 03/01/2014    Procedure: HYSTEROSCOPY WITH NOVASURE;  Surgeon: Lavonia Drafts, MD;  Location: [facility];  Service: Gynecology;  Laterality: N/A;  . Carpal tunnel release Left 06/25/2014    Procedure: LEFT CARPAL TUNNEL RELEASE;  Surgeon: Daryll Brod, MD;  Location: [facility];  Service: Orthopedics;  Laterality: Left;  . Wisdom tooth extraction    . Dilatation & curettage/hysteroscopy with myosure N/A 05/10/2015    Procedure: [place];  Surgeon: Lavonia Drafts, MD;  Location: [facility];  Service: Gynecology;  Laterality: N/A;  . Vaginal hysterectomy N/A 07/19/2015    Procedure: HYSTERECTOMY VAGINAL;  Surgeon: Lavonia Drafts, MD;  Location: [facility];  Service: Gynecology;  Laterality: N/A;  . Bilateral salpingectomy Bilateral 07/19/2015    Procedure: BILATERAL SALPINGECTOMY;  Surgeon: Lavonia Drafts, MD;  Location: [facility];  Service: Gynecology;  Laterality: Bilateral;  . Oophorectomy Right 07/19/2015    Procedure: PARTIAL OOPHORECTOMY;  Surgeon: Lavonia Drafts, MD;  Location: [facility];  Service: Gynecology;  Laterality: Right;   Family History  Problem Relation Age of Onset  . Heart disease Father   . Stroke Father   . Diabetes Son    Social History  Substance Use Topics  . Smoking status: Never Smoker   . Smokeless tobacco: Never Used  . Alcohol Use: No   OB History    Gravida Para Term Preterm AB TAB SAB Ectopic Multiple Living   6 6 6       6      Review of Systems As in history of present illness Allergies  Review of patient's allergies indicates no known allergies.  Home Medications   Prior to Admission medications   Medication Sig Start Date End Date Taking? Authorizing Provider  OVER THE COUNTER MEDICATION Over the counter cold medicines   Yes Historical Provider, MD  Adapalene (DIFFERIN) 0.1 % LOTN Apply 1 application topically daily.  11/10/14   Historical Provider, MD  albuterol (PROVENTIL HFA;VENTOLIN HFA) 108 (90 Base) MCG/ACT inhaler Inhale 2 puffs into the lungs  every 4 (four) hours as needed for wheezing or shortness of breath. 12/20/15   Melony Overly, MD  azithromycin (ZITHROMAX Z-PAK) 250 MG tablet Take 2 pills today, then 1 pill daily until gone. 12/20/15   Melony Overly, MD  fluconazole (DIFLUCAN) 150 MG tablet Take 1 tablet (150 mg total) by mouth once. Take 2nd pill if symptoms not completely resolved in 3 days. 12/20/15   Melony Overly, MD  HYDROcodone-homatropine [facility]) 5-1.5 MG/5ML syrup Take 5 mLs by mouth every 6 (six)  hours as needed for cough. 12/20/15   Melony Overly, MD  ibuprofen (ADVIL,MOTRIN) 600 MG tablet Take 1 tablet (600 mg total) by mouth every 6 (six) hours. 07/20/15   Lavonia Drafts, MD  lactulose (CEPHULAC) 10 G packet Take 1-2 packets (10-20 g total) by mouth 2 (two) times daily. 08/17/15   Manya Silvas, CNM  predniSONE (DELTASONE) 50 MG tablet Take 1 pill daily for 5 days. 12/20/15   Melony Overly, MD   Meds Ordered and Administered this Visit   Medications  ipratropium-albuterol (DUONEB) 0.5-2.5 (3) MG/3ML nebulizer solution 3 mL (3 mLs Nebulization Given 12/20/15 1438)  methylPREDNISolone sodium succinate (SOLU-MEDROL) 125 mg/2 mL injection 125 mg (125 mg Intramuscular Given 12/20/15 1437)    BP 138/105 mmHg  Pulse 77  Temp(Src) 98.7 F (37.1 C) (Oral)  Resp 16  SpO2 99%  LMP 05/27/2015 No data found.   Physical Exam  Constitutional: She is oriented to person, place, and time. She appears well-developed and well-nourished. No distress.  HENT:  Nose: Nose normal.  Mouth/Throat: Oropharynx is clear and moist. No oropharyngeal exudate.  Neck: Neck supple.  Cardiovascular: Normal rate and regular rhythm.   Pulmonary/Chest: Effort normal. No respiratory distress. She has wheezes (diffuse expiratory wheezes). She has no rales.  Diffuse rhonchi  Lymphadenopathy:    She has no cervical adenopathy.  Neurological: She is alert and oriented to person, place, and time.    ED Course  Procedures (including critical care time)  Labs Review Labs Reviewed - No data to display  Imaging Review No results found.    MDM   1. Acute bronchitis, unspecified organism    Lungs are clear after DuoNeb.  We'll treat with azithromycin and prednisone. Albuterol as needed for wheezing. Hycodan as needed for cough. Prescription sent for Diflucan as she reports frequent yeast infections after antibiotics. Follow-up as needed.    Melony Overly, MD 12/20/15 1455

## 2016-01-06 ENCOUNTER — Emergency Department (INDEPENDENT_AMBULATORY_CARE_PROVIDER_SITE_OTHER)
Admission: EM | Admit: 2016-01-06 | Discharge: 2016-01-06 | Disposition: A | Discharge status: Home / Self Care | Payer: 59 | Source: Home / Self Care | Attending: Family Medicine | Admitting: Family Medicine

## 2016-01-06 ENCOUNTER — Encounter (HOSPITAL_COMMUNITY): Payer: Self-pay | Admitting: Emergency Medicine

## 2016-01-06 DIAGNOSIS — I10 Essential (primary) hypertension: Secondary | ICD-10-CM | POA: Diagnosis not present

## 2016-01-06 DIAGNOSIS — G44209 Tension-type headache, unspecified, not intractable: Secondary | ICD-10-CM | POA: Diagnosis not present

## 2016-01-06 MED ORDER — HYDROCHLOROTHIAZIDE 12.5 MG PO CAPS: 12.5000 mg | ORAL_CAPSULE | Freq: Every day | ORAL | Status: DC

## 2016-01-06 MED ORDER — NAPROXEN 500 MG PO TABS: 500.0000 mg | ORAL_TABLET | Freq: Two times a day (BID) | ORAL | Status: DC

## 2016-01-06 NOTE — Discharge Instructions (Signed)
It is a pleasure to see you today.  For your headache, I am prescribing NAPROXEN 500mg  tablets, take 1 tablet by mouth every 12 hours only as needed for headache. Do not take with other anti-inflammatories such as ibuprofen (Tylenol is okay).   I am restarting the HCTZ 12.5mg  take 1 capsule by mouth daily, for your hypertension.  You are encouraged to check your blood pressure at home or at the drug store to see if it is above the target of 140/90.   I am glad you have an appointment with Dr. Carol Ada in April.

## 2016-01-06 NOTE — ED Notes (Signed)
C/o intermittent HA onset x1 week associated w/dizziness ... Will last for x1 hour.... Also c/o bilteral ankle edema onset x3-4 days... Hx of HTN; has not had HCTZ x1 year +++ ... A&O x4... No acute distress.

## 2016-01-06 NOTE — ED Provider Notes (Signed)
CSN: [id number]     Arrival date & time 01/06/16  1838 History   First MD Initiated Contact with Patient 01/06/16 2119     Chief Complaint  Patient presents with  . Headache  . Joint Swelling   (Consider location/radiation/quality/duration/timing/severity/associated sxs/prior Treatment) Patient is a 50 y.o. female presenting with headaches. The history is provided by the patient. No language interpreter was used.  Headache Associated symptoms: no abdominal pain, no congestion, no diarrhea, no fatigue, no fever, no nausea, no sinus pressure, no sore throat and no vomiting   Patient presents with complaint of intermittent temporal headaches off and on for the past 1 week, sometimes associated with seeing light spots in visual field. No photophobia, no aura, no N/V.  She has had migraines in thepast but says these are not the same. No focal weakness, no awakening from the HA. No loss of bowel or bladder control. No falls or dizziness.  No fevers or chills.   No sinus pain or rhinorrhea, no sore throat, no ear pain .  She has history of HTN and used to take HCTZ, has been off for about 1 year.  Has noticed swelling in ankles bilat. No chest pain, no dyspnea. No cough.      Past Medical History  Diagnosis Date  . Menorrhagia   . Bartholin gland cyst   . SVD (spontaneous vaginal delivery)     x 6   Past Surgical History  Procedure Laterality Date  . Bunionectomy  06/17/12    lt  . Breast reduction surgery  04/2003  . Tubal ligation  01/1994  . Hysteroscopy with novasure N/A 03/01/2014    Procedure: HYSTEROSCOPY WITH NOVASURE;  Surgeon: Lavonia Drafts, MD;  Location: [facility];  Service: Gynecology;  Laterality: N/A;  . Carpal tunnel release Left 06/25/2014    Procedure: LEFT CARPAL TUNNEL RELEASE;  Surgeon: Daryll Brod, MD;  Location: [facility];  Service: Orthopedics;  Laterality: Left;  . Wisdom tooth extraction    . Dilatation & curettage/hysteroscopy with myosure N/A  05/10/2015    Procedure: [place];  Surgeon: Lavonia Drafts, MD;  Location: [facility];  Service: Gynecology;  Laterality: N/A;  . Vaginal hysterectomy N/A 07/19/2015    Procedure: HYSTERECTOMY VAGINAL;  Surgeon: Lavonia Drafts, MD;  Location: [facility];  Service: Gynecology;  Laterality: N/A;  . Bilateral salpingectomy Bilateral 07/19/2015    Procedure: BILATERAL SALPINGECTOMY;  Surgeon: Lavonia Drafts, MD;  Location: [facility];  Service: Gynecology;  Laterality: Bilateral;  . Oophorectomy Right 07/19/2015    Procedure: PARTIAL OOPHORECTOMY;  Surgeon: Lavonia Drafts, MD;  Location: [facility];  Service: Gynecology;  Laterality: Right;   Family History  Problem Relation Age of Onset  . Heart disease Father   . Stroke Father   . Diabetes Son    Social History  Substance Use Topics  . Smoking status: Never Smoker   . Smokeless tobacco: Never Used  . Alcohol Use: No   OB History    Gravida Para Term Preterm AB TAB SAB Ectopic Multiple Living   6 6 6       6      Review of Systems  Constitutional: Negative for fever, chills, appetite change and fatigue.  HENT: Negative for congestion, sinus pressure and sore throat.   Gastrointestinal: Negative for nausea, vomiting, abdominal pain and diarrhea.  Neurological: Positive for headaches.    Allergies  Review of patient's allergies indicates no known allergies.  Home Medications   Prior  to Admission medications   Medication Sig Start Date End Date Taking? Authorizing Provider  Adapalene (DIFFERIN) 0.1 % LOTN Apply 1 application topically daily.  11/10/14   Historical Provider, MD  albuterol (PROVENTIL HFA;VENTOLIN HFA) 108 (90 Base) MCG/ACT inhaler Inhale 2 puffs into the lungs every 4 (four) hours as needed for wheezing or shortness of breath. 12/20/15   Melony Overly, MD  azithromycin (ZITHROMAX Z-PAK) 250 MG tablet Take 2 pills today, then 1 pill daily until gone. 12/20/15   Melony Overly, MD  fluconazole (DIFLUCAN) 150 MG tablet Take 1 tablet (150 mg total) by mouth once. Take 2nd pill if symptoms not completely resolved in 3 days. 12/20/15   Melony Overly, MD  hydrochlorothiazide (MICROZIDE) 12.5 MG capsule Take 1 capsule (12.5 mg total) by mouth daily. 01/06/16   Willeen Niece, MD  HYDROcodone-homatropine [facility]) 5-1.5 MG/5ML syrup Take 5 mLs by mouth every 6 (six) hours as needed for cough. 12/20/15   Melony Overly, MD  lactulose (CEPHULAC) 10 G packet Take 1-2 packets (10-20 g total) by mouth 2 (two) times daily. 08/17/15   Manya Silvas, CNM  naproxen (NAPROSYN) 500 MG tablet Take 1 tablet (500 mg total) by mouth 2 (two) times daily with a meal. 01/06/16   Willeen Niece, MD  OVER THE COUNTER MEDICATION Over the counter cold medicines    Historical Provider, MD  predniSONE (DELTASONE) 50 MG tablet Take 1 pill daily for 5 days. 12/20/15   Melony Overly, MD   Meds Ordered and Administered this Visit  Medications - No data to display  BP 150/75 mmHg  Pulse 73  Temp(Src) 98.3 F (36.8 C) (Oral)  SpO2 100%  LMP 05/27/2015 No data found.   Physical Exam  Constitutional: She appears well-developed and well-nourished. No distress.  HENT:  Head: Normocephalic and atraumatic.  Right Ear: External ear normal.  Left Ear: External ear normal.  Mouth/Throat: Oropharynx is clear and moist.  Eyes: Conjunctivae and EOM are normal.  nondilated funduscopic exam no evidence of papilledema.   Neck: Normal range of motion. Neck supple.  Cardiovascular: Normal rate, regular rhythm and normal heart sounds.   Pulmonary/Chest: Effort normal and breath sounds normal. No respiratory distress. She has no wheezes. She has no rales. She exhibits no tenderness.  Lymphadenopathy:    She has no cervical adenopathy.  Neurological:  Sensation intact in feet bilaterally.  DP pulses bilaterally.   Trace pitting edema to halfway up shins bilaterally.  No clonus.    Skin: She is not  diaphoretic.    ED Course  Procedures (including critical care time)  Labs Review Labs Reviewed - No data to display  Imaging Review No results found.   Visual Acuity Review  Right Eye Distance:   Left Eye Distance:   Bilateral Distance:    Right Eye Near:   Left Eye Near:    Bilateral Near:         MDM   1. Tension-type headache, not intractable, unspecified chronicity pattern   2. Hypertension, essential    Patient with bitemporal headaches, suspect tension type headache.  No worrisome features at this time. Discussed use of NSAID prn, to be careful to avoid overuse.   HTN: to restart HCTZ 12.5mg  daily; she has follow up established in the coming month.  To return to [facility] if needed before then.   Dalbert Mayotte, MD    Willeen Niece, MD 01/06/16 2137

## 2016-03-21 ENCOUNTER — Encounter (HOSPITAL_BASED_OUTPATIENT_CLINIC_OR_DEPARTMENT_OTHER): Payer: Self-pay | Admitting: Emergency Medicine

## 2016-03-21 ENCOUNTER — Other Ambulatory Visit: Payer: Self-pay

## 2016-03-21 ENCOUNTER — Emergency Department (HOSPITAL_BASED_OUTPATIENT_CLINIC_OR_DEPARTMENT_OTHER)
Admission: EM | Admit: 2016-03-21 | Discharge: 2016-03-21 | Disposition: A | Discharge status: Home / Self Care | Payer: 59 | Attending: Emergency Medicine | Admitting: Emergency Medicine

## 2016-03-21 ENCOUNTER — Emergency Department (HOSPITAL_BASED_OUTPATIENT_CLINIC_OR_DEPARTMENT_OTHER): Payer: 59

## 2016-03-21 DIAGNOSIS — M7989 Other specified soft tissue disorders: Secondary | ICD-10-CM | POA: Diagnosis not present

## 2016-03-21 DIAGNOSIS — R0602 Shortness of breath: Secondary | ICD-10-CM | POA: Diagnosis not present

## 2016-03-21 DIAGNOSIS — R002 Palpitations: Secondary | ICD-10-CM | POA: Diagnosis present

## 2016-03-21 LAB — COMPREHENSIVE METABOLIC PANEL
ALT: 18 U/L (ref 14–54)
AST: 24 U/L (ref 15–41)
Albumin: 3.9 g/dL (ref 3.5–5.0)
Alkaline Phosphatase: 67 U/L (ref 38–126)
Anion gap: 7 (ref 5–15)
BUN: 12 mg/dL (ref 6–20)
CHLORIDE: 100 mmol/L — AB (ref 101–111)
CO2: 26 mmol/L (ref 22–32)
CREATININE: 0.79 mg/dL (ref 0.44–1.00)
Calcium: 9 mg/dL (ref 8.9–10.3)
GFR calc Af Amer: >60 mL/min (ref >60)
GFR calc non Af Amer: >60 mL/min (ref >60)
GLUCOSE: 118 mg/dL — AB (ref 65–99)
POTASSIUM: 3.8 mmol/L (ref 3.5–5.1)
Sodium: 133 mmol/L — ABNORMAL LOW (ref 135–145)
Total Bilirubin: 0.7 mg/dL (ref 0.3–1.2)
Total Protein: 7.5 g/dL (ref 6.5–8.1)

## 2016-03-21 LAB — CBC WITH DIFFERENTIAL/PLATELET
Basophils Absolute: 0 10*3/uL (ref 0.0–0.1)
Basophils Relative: 0 %
EOS PCT: 1 %
Eosinophils Absolute: 0.1 10*3/uL (ref 0.0–0.7)
HCT: 34.8 % — ABNORMAL LOW (ref 36.0–46.0)
Hemoglobin: 11.6 g/dL — ABNORMAL LOW (ref 12.0–15.0)
LYMPHS ABS: 2.8 10*3/uL (ref 0.7–4.0)
LYMPHS PCT: 46 %
MCH: 28.3 pg (ref 26.0–34.0)
MCHC: 33.3 g/dL (ref 30.0–36.0)
MCV: 84.9 fL (ref 78.0–100.0)
MONO ABS: 0.6 10*3/uL (ref 0.1–1.0)
MONOS PCT: 10 %
Neutro Abs: 2.7 10*3/uL (ref 1.7–7.7)
Neutrophils Relative %: 43 %
PLATELETS: 287 10*3/uL (ref 150–400)
RBC: 4.1 MIL/uL (ref 3.87–5.11)
RDW: 14.1 % (ref 11.5–15.5)
WBC: 6.2 10*3/uL (ref 4.0–10.5)

## 2016-03-21 LAB — BRAIN NATRIURETIC PEPTIDE: B NATRIURETIC PEPTIDE 5: 7.1 pg/mL (ref 0.0–100.0)

## 2016-03-21 LAB — TROPONIN I

## 2016-03-21 MED ORDER — HYDROCHLOROTHIAZIDE 12.5 MG PO CAPS: 12.5000 mg | ORAL_CAPSULE | Freq: Every day | ORAL | Status: DC

## 2016-03-21 NOTE — ED Provider Notes (Signed)
CSN: [id number]     Arrival date & time 03/21/16  1734 History   First MD Initiated Contact with Patient 03/21/16 1748     Chief Complaint  Patient presents with  . Palpitations  . Shortness of Breath     (Consider location/radiation/quality/duration/timing/severity/associated sxs/prior Treatment) HPI Comments: Patient is a 50 year old female who presents with complaints of palpitations and shortness of breath which have worsened over the past several days. She denies any chest pain. She denies any fevers, chills, or cough. She denies any leg or calf swelling or pain.  Patient is a 50 y.o. female presenting with palpitations and shortness of breath. The history is provided by the patient.  Palpitations Palpitations quality:  Fast Onset quality:  At rest Duration:  4 days Timing:  Intermittent Progression:  Worsening Chronicity:  New Relieved by:  Nothing Worsened by:  Nothing Ineffective treatments:  None tried Associated symptoms: shortness of breath   Shortness of Breath   Past Medical History  Diagnosis Date  . Menorrhagia   . Bartholin gland cyst   . SVD (spontaneous vaginal delivery)     x 6   Past Surgical History  Procedure Laterality Date  . Bunionectomy  06/17/12    lt  . Breast reduction surgery  04/2003  . Tubal ligation  01/1994  . Hysteroscopy with novasure N/A 03/01/2014    Procedure: HYSTEROSCOPY WITH NOVASURE;  Surgeon: Lavonia Drafts, MD;  Location: [facility];  Service: Gynecology;  Laterality: N/A;  . Carpal tunnel release Left 06/25/2014    Procedure: LEFT CARPAL TUNNEL RELEASE;  Surgeon: Daryll Brod, MD;  Location: [facility];  Service: Orthopedics;  Laterality: Left;  . Wisdom tooth extraction    . Dilatation & curettage/hysteroscopy with myosure N/A 05/10/2015    Procedure: [place];  Surgeon: Lavonia Drafts, MD;  Location: [facility];  Service: Gynecology;  Laterality: N/A;  . Vaginal  hysterectomy N/A 07/19/2015    Procedure: HYSTERECTOMY VAGINAL;  Surgeon: Lavonia Drafts, MD;  Location: [facility];  Service: Gynecology;  Laterality: N/A;  . Bilateral salpingectomy Bilateral 07/19/2015    Procedure: BILATERAL SALPINGECTOMY;  Surgeon: Lavonia Drafts, MD;  Location: [facility];  Service: Gynecology;  Laterality: Bilateral;  . Oophorectomy Right 07/19/2015    Procedure: PARTIAL OOPHORECTOMY;  Surgeon: Lavonia Drafts, MD;  Location: [facility];  Service: Gynecology;  Laterality: Right;   Family History  Problem Relation Age of Onset  . Heart disease Father   . Stroke Father   . Diabetes Son    Social History  Substance Use Topics  . Smoking status: Never Smoker   . Smokeless tobacco: Never Used  . Alcohol Use: No   OB History    Gravida Para Term Preterm AB TAB SAB Ectopic Multiple Living   6 6 6       6      Review of Systems  Respiratory: Positive for shortness of breath.   Cardiovascular: Positive for palpitations.  All other systems reviewed and are negative.     Allergies  Review of patient's allergies indicates no known allergies.  Home Medications   Prior to Admission medications   Medication Sig Start Date End Date Taking? Authorizing Provider  Adapalene (DIFFERIN) 0.1 % LOTN Apply 1 application topically daily.  11/10/14   Historical Provider, MD  albuterol (PROVENTIL HFA;VENTOLIN HFA) 108 (90 Base) MCG/ACT inhaler Inhale 2 puffs into the lungs every 4 (four) hours as needed for wheezing or shortness of breath. 12/20/15   Junie Panning  Earlene Plater, MD  azithromycin (ZITHROMAX Z-PAK) 250 MG tablet Take 2 pills today, then 1 pill daily until gone. 12/20/15   Melony Overly, MD  fluconazole (DIFLUCAN) 150 MG tablet Take 1 tablet (150 mg total) by mouth once. Take 2nd pill if symptoms not completely resolved in 3 days. 12/20/15   Melony Overly, MD  hydrochlorothiazide (MICROZIDE) 12.5 MG capsule Take 1 capsule (12.5 mg total) by mouth daily. 03/21/16   Veryl Speak,  MD  HYDROcodone-homatropine [facility]) 5-1.5 MG/5ML syrup Take 5 mLs by mouth every 6 (six) hours as needed for cough. 12/20/15   Melony Overly, MD  lactulose (CEPHULAC) 10 G packet Take 1-2 packets (10-20 g total) by mouth 2 (two) times daily. 08/17/15   Manya Silvas, CNM  naproxen (NAPROSYN) 500 MG tablet Take 1 tablet (500 mg total) by mouth 2 (two) times daily with a meal. 01/06/16   Willeen Niece, MD  OVER THE COUNTER MEDICATION Over the counter cold medicines    Historical Provider, MD  predniSONE (DELTASONE) 50 MG tablet Take 1 pill daily for 5 days. 12/20/15   Melony Overly, MD   BP 120/65 mmHg  Pulse 71  Temp(Src) 98 F (36.7 C) (Oral)  Resp 15  Ht 5\' 4"  (1.626 m)  Wt 196 lb (88.905 kg)  BMI 33.63 kg/m2  SpO2 100%  LMP 05/27/2015 Physical Exam  Constitutional: She is oriented to person, place, and time. She appears well-developed and well-nourished. No distress.  HENT:  Head: Normocephalic and atraumatic.  Neck: Normal range of motion. Neck supple.  Cardiovascular: Normal rate and regular rhythm.  Exam reveals no gallop and no friction rub.   No murmur heard. Pulmonary/Chest: Effort normal and breath sounds normal. No respiratory distress. She has no wheezes.  Abdominal: Soft. Bowel sounds are normal. She exhibits no distension. There is no tenderness.  Musculoskeletal: Normal range of motion. She exhibits no edema or tenderness.  There is no calf tenderness or swelling. Homans sign is absent bilaterally.  Neurological: She is alert and oriented to person, place, and time.  Skin: Skin is warm and dry. She is not diaphoretic.  Nursing note and vitals reviewed.   ED Course  Procedures (including critical care time) Labs Review Labs Reviewed  COMPREHENSIVE METABOLIC PANEL - Abnormal; Notable for the following:    Sodium 133 (*)    Chloride 100 (*)    Glucose, Bld 118 (*)    All other components within normal limits  CBC WITH DIFFERENTIAL/PLATELET - Abnormal; Notable for the  following:    Hemoglobin 11.6 (*)    HCT 34.8 (*)    All other components within normal limits  TROPONIN I  BRAIN NATRIURETIC PEPTIDE    Imaging Review Dg Chest 2 View  03/21/2016  CLINICAL DATA:  Shortness of breath and cardiac palpitations EXAM: CHEST  2 VIEW COMPARISON:  June 17, 2015 FINDINGS: There is no edema or consolidation. The heart size and pulmonary vascularity are normal. No adenopathy. No bone lesions. IMPRESSION: No edema or consolidation. Electronically Signed   By: Lowella Grip III M.D.   On: 03/21/2016 19:01   I have personally reviewed and evaluated these images and lab results as part of my medical decision-making.   EKG Interpretation   Date/Time:  Wednesday Mar 21 2016 17:38:51 EDT Ventricular Rate:  85 PR Interval:  142 QRS Duration: 80 QT Interval:  376 QTC Calculation: 447 R Axis:   7 Text Interpretation:  Normal sinus rhythm Normal ECG  Confirmed by Stark Jock   MD, Kambrea Carrasco ([id number]) on 03/21/2016 5:49:32 PM      MDM   Final diagnoses:  Palpitations    EKG and laboratory studies are all unremarkable. The patient has been off of her diuretic for many months. I will restart her HCTZ and have her follow-up with her primary doctor. I highly doubt an acute cardiac event or pulmonary embolism.    Veryl Speak, MD 03/21/16 [phone]

## 2016-03-21 NOTE — Discharge Instructions (Signed)
Resume your hydrochlorothiazide as before.  Follow-up with your primary Dr. in one week for a recheck.   Palpitations A palpitation is the feeling that your heartbeat is irregular or is faster than normal. It may feel like your heart is fluttering or skipping a beat. Palpitations are usually not a serious problem. However, in some cases, you may need further medical evaluation. CAUSES  Palpitations can be caused by:  Smoking.  Caffeine or other stimulants, such as diet pills or energy drinks.  Alcohol.  Stress and anxiety.  Strenuous physical activity.  Fatigue.  Certain medicines.  Heart disease, especially if you have a history of irregular heart rhythms (arrhythmias), such as atrial fibrillation, atrial flutter, or supraventricular tachycardia.  An improperly working pacemaker or defibrillator. DIAGNOSIS  To find the cause of your palpitations, your health care provider will take your medical history and perform a physical exam. Your health care provider may also have you take a test called an ambulatory electrocardiogram (ECG). An ECG records your heartbeat patterns over a 24-hour period. You may also have other tests, such as:  Transthoracic echocardiogram (TTE). During echocardiography, sound waves are used to evaluate how blood flows through your heart.  Transesophageal echocardiogram (TEE).  Cardiac monitoring. This allows your health care provider to monitor your heart rate and rhythm in real time.  Holter monitor. This is a portable device that records your heartbeat and can help diagnose heart arrhythmias. It allows your health care provider to track your heart activity for several days, if needed.  Stress tests by exercise or by giving medicine that makes the heart beat faster. TREATMENT  Treatment of palpitations depends on the cause of your symptoms and can vary greatly. Most cases of palpitations do not require any treatment other than time, relaxation, and  monitoring your symptoms. Other causes, such as atrial fibrillation, atrial flutter, or supraventricular tachycardia, usually require further treatment. HOME CARE INSTRUCTIONS   Avoid:  Caffeinated coffee, tea, soft drinks, diet pills, and energy drinks.  Chocolate.  Alcohol.  Stop smoking if you smoke.  Reduce your stress and anxiety. Things that can help you relax include:  A method of controlling things in your body, such as your heartbeats, with your mind (biofeedback).  Yoga.  Meditation.  Physical activity such as swimming, jogging, or walking.  Get plenty of rest and sleep. SEEK MEDICAL CARE IF:   You continue to have a fast or irregular heartbeat beyond 24 hours.  Your palpitations occur more often. SEEK IMMEDIATE MEDICAL CARE IF:  You have chest pain or shortness of breath.  You have a severe headache.  You feel dizzy or you faint. MAKE SURE YOU:  Understand these instructions.  Will watch your condition.  Will get help right away if you are not doing well or get worse.   This information is not intended to replace advice given to you by your health care provider. Make sure you discuss any questions you have with your health care provider.   Document Released: 10/19/2000 Document Revised: 10/27/2013 Document Reviewed: 12/21/2011 Elsevier Interactive Patient Education [employer].

## 2016-03-21 NOTE — ED Notes (Signed)
Pt in c/o palpitations and SOB onset approx 3-4 days. Also states bilateral ankle swelling since then. Ambulatory to triage in NAD. Speaking well in complete sentences.

## 2016-03-21 NOTE — ED Notes (Signed)
Pt given d/c instructions as per chart. Verbalizes understanding. No questions. Rx x 1 

## 2016-03-30 ENCOUNTER — Ambulatory Visit (INDEPENDENT_AMBULATORY_CARE_PROVIDER_SITE_OTHER): Payer: 59 | Admitting: Obstetrics & Gynecology

## 2016-03-30 ENCOUNTER — Encounter: Payer: Self-pay | Admitting: Obstetrics & Gynecology

## 2016-03-30 VITALS — BP 138/81 | HR 90 | Wt 228.0 lb

## 2016-03-30 DIAGNOSIS — E669 Obesity, unspecified: Secondary | ICD-10-CM

## 2016-03-30 DIAGNOSIS — R5383 Other fatigue: Secondary | ICD-10-CM

## 2016-03-30 DIAGNOSIS — F32A Depression, unspecified: Secondary | ICD-10-CM

## 2016-03-30 DIAGNOSIS — F329 Major depressive disorder, single episode, unspecified: Secondary | ICD-10-CM | POA: Diagnosis not present

## 2016-03-30 LAB — FOLLICLE STIMULATING HORMONE: FSH: 23.9 m[IU]/mL

## 2016-03-30 LAB — TSH: TSH: 2.49 mIU/L

## 2016-03-30 MED ORDER — SERTRALINE HCL 50 MG PO TABS: 50.0000 mg | ORAL_TABLET | Freq: Every day | ORAL | Status: DC

## 2016-03-30 MED ORDER — SERTRALINE HCL 25 MG PO TABS: 25.0000 mg | ORAL_TABLET | Freq: Every day | ORAL | Status: DC

## 2016-03-30 NOTE — Patient Instructions (Signed)
Whole 30  Fluoxetine capsules, weekly [Depression/Mood Disorders] What is this medicine? FLUOXETINE (floo OX e teen) belongs to a class of drugs known as selective serotonin reuptake inhibitors (SSRIs). The weekly capsules can treat mood problems such as depression. This medicine may be used for other purposes; ask your health care provider or pharmacist if you have questions. What should I tell my health care provider before I take this medicine? They need to know if you have any of these conditions: -bipolar disorder or mania -diabetes -glaucoma -liver disease -psychosis -seizures -suicidal thoughts or history of attempted suicide -an unusual or allergic reaction to fluoxetine, other medicines, foods, dyes, or preservatives -pregnant or trying to get pregnant -breast-feeding How should I use this medicine? Take this medicine by mouth with a glass of water. Follow the directions on the prescription label. Do not cut, crush, or chew this medicine. You can take this medicine with or without food. Take it on the same day of the week each week. Do not take your medicine more often than directed. Do not stop taking this medicine suddenly except upon the advice of your doctor. Stopping this medicine too quickly may cause serious side effects or your condition may worsen. A special MedGuide will be given to you by the pharmacist with each prescription and refill. Be sure to read this information carefully each time. Talk to your pediatrician regarding the use of this medicine in children. Special care may be needed. Overdosage: If you think you have taken too much of this medicine contact a poison control center or emergency room at once. NOTE: This medicine is only for you. Do not share this medicine with others. What if I miss a dose? Try not to miss your scheduled weekly dose. You may want to mark a calendar to remind you. If you miss a dose, and it is still the day you normally take your  medicine each week, then take it as soon as you can. If it is another day, then take the dose as soon as you remember and then adjust your weekly doses to the new day of the week. Do not take double or extra doses. There should be one week between each dose. What may interact with this medicine? Do not take fluoxetine with any of the following medications: -other medicines containing fluoxetine, like Sarafem or Symbyax -cisapride -linezolid -MAOIs like Carbex, Eldepryl, Marplan, Nardil, and Parnate -methylene blue (injected into a vein) -pimozide -thioridazine Fluoxetine may also interact with the following medications: -alcohol -aspirin and aspirin-like medicines -carbamazepine -certain medicines for depression, anxiety, or psychotic disturbances -certain medicines for migraine headaches like almotriptan, eletriptan, frovatriptan, naratriptan, rizatriptan, sumatriptan, zolmitriptan -digoxin -diuretics -fentanyl -flecainide -furazolidone -isoniazid -lithium -medicines for sleep -medicines that treat or prevent blood clots like warfarin, enoxaparin, and dalteparin -NSAIDs, medicines for pain and inflammation, like ibuprofen or naproxen -phenytoin -procarbazine -propafenone -rasagiline -ritonavir -supplements like St. John's wort. kava kava, valerian -tramadol -tryptophan -vinblastine This list may not describe all possible interactions. Give your health care provider a list of all the medicines, herbs, non-prescription drugs, or dietary supplements you use. Also tell them if you smoke, drink alcohol, or use illegal drugs. Some items may interact with your medicine. What should I watch for while using this medicine? Tell your doctor if your symptoms do not get better or if they get worse. Visit your doctor or health care professional for regular checks on your progress. Because it may take several weeks to see the full effects of  this medicine, it is important to continue your  treatment as prescribed by your doctor. Patients and their families should watch out for new or worsening thoughts of suicide or depression. Also watch out for sudden changes in feelings such as feeling anxious, agitated, panicky, irritable, hostile, aggressive, impulsive, severely restless, overly excited and hyperactive, or not being able to sleep. If this happens, especially at the beginning of treatment or after a change in dose, call your health care professional. Dennis Bast may get drowsy or dizzy. Do not drive, use machinery, or do anything that needs mental alertness until you know how this medicine affects you. Do not stand or sit up quickly, especially if you are an older patient. This reduces the risk of dizzy or fainting spells. Alcohol may interfere with the effect of this medicine. Avoid alcoholic drinks. Your mouth may get dry. Chewing sugarless gum or sucking hard candy, and drinking plenty of water may help. Contact your doctor if the problem does not go away or is severe. This medicine may affect blood sugar levels. If you have diabetes, check with your doctor or health care professional before you change your diet or the dose of your diabetic medicine. What side effects may I notice from receiving this medicine? Side effects that you should report to your doctor or health care professional as soon as possible: -allergic reactions like skin rash, itching or hives, swelling of the face, lips, or tongue -breathing problems -confusion -eye pain, changes in vision -fast or irregular heart rate, palpitations -flu-like fever, chills, cough, muscle or joint aches and pains -seizures -suicidal thoughts or other mood changes -swelling or redness in or around the eye -tremor -trouble sleeping -unusual bleeding or bruising -unusually tired or weak -vomiting Side effects that usually do not require medical attention (report to your doctor or health care professional if they continue or are  bothersome): -change in sex drive or performance -diarrhea -dry mouth -flushing -headache -increased or decreased appetite -nausea -sweating This list may not describe all possible side effects. Call your doctor for medical advice about side effects. You may report side effects to FDA at 1-800-FDA-1088. Where should I keep my medicine? Keep out of the reach of children. Store at room temperature between 15 and 30 degrees C (59 and 86 degrees F). Throw away any unused medicine after the expiration date. NOTE: This sheet is a summary. It may not cover all possible information. If you have questions about this medicine, talk to your doctor, pharmacist, or health care provider.    2016, Elsevier/Gold Standard. (2014-10-15 12:41:56)

## 2016-03-30 NOTE — Progress Notes (Signed)
[name], female   DOB: [birth year], 50 y.o.   MRN: [id number] CLINIC ENCOUNTER NOTE  History:  50 y.o. [id number] here today for 6 month check after [facility] 9/13/2/16. Pt c/o significant fatigue for 3 months.  She also c/o LBP. 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 Min exercise.  She currently lives alone and only leaves the house for work.  She reports a h/o depression and concedes that she thinks she may be depressed.  No suicidal or homicidal ideation.  She reports eating excessive amount and lots of junk food and has gained weight. Pt denies GYN complaints.  Past Medical History  Diagnosis Date  . Menorrhagia   . Bartholin gland cyst   . SVD (spontaneous vaginal delivery)     x 6    Past Surgical History  Procedure Laterality Date  . Bunionectomy  06/17/12    lt  . Breast reduction surgery  04/2003  . Tubal ligation  01/1994  . Hysteroscopy with novasure N/A 03/01/2014    Procedure: HYSTEROSCOPY WITH NOVASURE;  Surgeon: Lavonia Drafts, MD;  Location: [place] [facility];  Service: Gynecology;  Laterality: N/A;  . Carpal tunnel release Left 06/25/2014    Procedure: LEFT CARPAL TUNNEL RELEASE;  Surgeon: Daryll Brod, MD;  Location: [place];  Service: Orthopedics;  Laterality: Left;  . Wisdom tooth extraction    . Dilatation & curettage/hysteroscopy with myosure N/A 05/10/2015    Procedure: [place];  Surgeon: Lavonia Drafts, MD;  Location: [place] [facility];  Service: Gynecology;  Laterality: N/A;  . Vaginal hysterectomy N/A 07/19/2015    Procedure: HYSTERECTOMY VAGINAL;  Surgeon: Lavonia Drafts, MD;  Location: [place] [facility];  Service: Gynecology;  Laterality: N/A;  . Bilateral salpingectomy Bilateral 07/19/2015    Procedure: BILATERAL SALPINGECTOMY;  Surgeon: Lavonia Drafts, MD;  Location: [place] [facility];  Service: Gynecology;  Laterality: Bilateral;  . Oophorectomy Right 07/19/2015    Procedure: PARTIAL  OOPHORECTOMY;  Surgeon: Lavonia Drafts, MD;  Location: [place] [facility];  Service: Gynecology;  Laterality: Right;    The following portions of the patient's history were reviewed and updated as appropriate: allergies, current medications, past family history, past medical history, past social history, past surgical history and problem list.     Review of Systems:  Pertinent items are noted in HPI. Comprehensive review of systems was otherwise negative.  Objective:  Physical Exam BP 138/81 mmHg  Pulse 90  Wt 228 lb (103.42 kg)  LMP 05/27/2015 CONSTITUTIONAL: Well-developed, well-nourished female in no acute distress.  NECK: Normal range of motion, supple, no masses SKIN: Skin is warm and dry. No rash noted. Not diaphoretic. No erythema. No pallor. Gate: Alert and oriented to person, place, and time. Normal reflexes, muscle tone coordination. No cranial nerve deficit noted. PSYCHIATRIC: depressed mood and affect. Normal behavior. Normal judgment and thought content. CARDIOVASCULAR: Normal heart rate noted RESPIRATORY: Effort and breath sounds normal, no problems with respiration noted ABDOMEN: Soft, no distention noted.  No tenderness, rebound or guarding.  PELVIC: Deferred Back: FROM NT   Labs and Imaging Dg Chest 2 View  03/21/2016  CLINICAL DATA:  Shortness of breath and cardiac palpitations EXAM: CHEST  2 VIEW COMPARISON:  June 17, 2015 FINDINGS: There is no edema or consolidation. The heart size and pulmonary vascularity are normal. No adenopathy. No bone lesions. IMPRESSION: No edema or consolidation. Electronically Signed   By: Lowella Grip III M.D.   On: 03/21/2016 19:01    Assessment &  Plan:  Depression Obesity  Routine preventative health maintenance measures emphasized.  Exercise (Preferably in direct sunlight) 5-6x/week Discard all junk food for now Eliminate sugar from diet  Zoloft 25mg  daily for 2 weeks.  Increase to 50mg  daily after 2 weeks   Labs:  TSH &CBC  F/u in 6 weeks or sooner prn   Total face-to-face time with patient: 35 minutes. Over 95% of encounter was spent on counseling and coordination of care.   Almando Brawley L. Ihor Dow, MD, [place] Attending [place] for [employer], [place]

## 2016-04-03 ENCOUNTER — Encounter: Payer: Self-pay | Admitting: Obstetrics & Gynecology

## 2016-07-19 ENCOUNTER — Ambulatory Visit (HOSPITAL_COMMUNITY)
Admission: EM | Admit: 2016-07-19 | Discharge: 2016-07-19 | Discharge status: Against Medical Advice | Payer: 59 | Attending: Internal Medicine | Admitting: Internal Medicine

## 2016-07-19 ENCOUNTER — Other Ambulatory Visit: Payer: Self-pay

## 2016-07-19 ENCOUNTER — Ambulatory Visit (INDEPENDENT_AMBULATORY_CARE_PROVIDER_SITE_OTHER): Payer: 59

## 2016-07-19 ENCOUNTER — Encounter (HOSPITAL_COMMUNITY): Payer: Self-pay | Admitting: *Deleted

## 2016-07-19 DIAGNOSIS — R600 Localized edema: Secondary | ICD-10-CM

## 2016-07-19 DIAGNOSIS — R0602 Shortness of breath: Secondary | ICD-10-CM

## 2016-07-19 DIAGNOSIS — R0609 Other forms of dyspnea: Secondary | ICD-10-CM | POA: Diagnosis not present

## 2016-07-19 HISTORY — DX: Fluid overload, unspecified: E87.70

## 2016-07-19 LAB — POCT I-STAT, CHEM 8
BUN: 13 mg/dL (ref 6–20)
CHLORIDE: 102 mmol/L (ref 101–111)
Calcium, Ion: 1.26 mmol/L (ref 1.15–1.40)
Creatinine, Ser: 0.9 mg/dL (ref 0.44–1.00)
Glucose, Bld: 115 mg/dL — ABNORMAL HIGH (ref 65–99)
HEMATOCRIT: 37 % (ref 36.0–46.0)
Hemoglobin: 12.6 g/dL (ref 12.0–15.0)
Potassium: 4 mmol/L (ref 3.5–5.1)
SODIUM: 139 mmol/L (ref 135–145)
TCO2: 29 mmol/L (ref 0–100)

## 2016-07-19 MED ORDER — HYDROCHLOROTHIAZIDE 25 MG PO TABS: 25.0000 mg | ORAL_TABLET | Freq: Every day | ORAL | 0 refills | Status: DC

## 2016-07-19 NOTE — ED Triage Notes (Signed)
Pt  Has  Symptoms  Of    Swelling     Of    Extremities       Shortness  Of breath     Pt  Has  Stopped  Her   Diuretics      sev  Months  Ago          She  Has  Some  Fluid  rethention      She  Ambulated  To  Room  With a  Steady  Fluid  Gait

## 2016-07-19 NOTE — ED Provider Notes (Signed)
CSN: [id number]     Arrival date & time 07/19/16  1758 History   None    Chief Complaint  Patient presents with  . Shortness of Breath   (Consider location/radiation/quality/duration/timing/severity/associated sxs/prior Treatment) 50 year old female presents to the urgent care complaining of shortness of breath, DOE, fluid retention, swelling in the ankles lower extremities for 2 days. She is also having orthopnea and occasionally some tightness in the chest. She states she has had the symptoms before and it then 2 other urgent cares and had "complete workups". She states she has never had any trouble with her heart. She does not have a PCP and she apparently missed her first appointment so her next one is not until December. She had been given HCTZ and an urgent care or emergency department which tended to help with her fluid overload and she felt better.    The patient is advised that although there are some tests that we can do in the urgent care there are other tests and treatments not available here that she may need. I am explained that the best assessment and management of her condition will be performed in the emergency department. When he not be able to provide an accurate diagnosis or treatment here. The patient insists that she just needs a prescription but will allow to have some test. The patient was also advised that likely she will be asked to sign an AMA if she decides to go home rather than go to the emergency department.      Past Medical History:  Diagnosis Date  . Bartholin gland cyst   . Fluid excess   . Hypertension   . Menorrhagia   . SVD (spontaneous vaginal delivery)    x 6   Past Surgical History:  Procedure Laterality Date  . BILATERAL SALPINGECTOMY Bilateral 07/19/2015   Procedure: BILATERAL SALPINGECTOMY;  Surgeon: Lavonia Drafts, MD;  Location: [place] [facility];  Service: Gynecology;  Laterality: Bilateral;  . BREAST REDUCTION SURGERY  04/2003  . BUNIONECTOMY   06/17/12   lt  . CARPAL TUNNEL RELEASE Left 06/25/2014   Procedure: LEFT CARPAL TUNNEL RELEASE;  Surgeon: Daryll Brod, MD;  Location: [place];  Service: Orthopedics;  Laterality: Left;  . DILATATION & CURETTAGE/HYSTEROSCOPY WITH MYOSURE N/A 05/10/2015   Procedure: DILATATION & CURETTAGE/HYSTEROSCOPY WITH MYOSURE;  Surgeon: Lavonia Drafts, MD;  Location: [place] [facility];  Service: Gynecology;  Laterality: N/A;  . HYSTEROSCOPY WITH NOVASURE N/A 03/01/2014   Procedure: HYSTEROSCOPY WITH NOVASURE;  Surgeon: Lavonia Drafts, MD;  Location: [place] [facility];  Service: Gynecology;  Laterality: N/A;  . OOPHORECTOMY Right 07/19/2015   Procedure: PARTIAL OOPHORECTOMY;  Surgeon: Lavonia Drafts, MD;  Location: [place] [facility];  Service: Gynecology;  Laterality: Right;  . TUBAL LIGATION  01/1994  . VAGINAL HYSTERECTOMY N/A 07/19/2015   Procedure: HYSTERECTOMY VAGINAL;  Surgeon: Lavonia Drafts, MD;  Location: [place] [facility];  Service: Gynecology;  Laterality: N/A;  . WISDOM TOOTH EXTRACTION     Family History  Problem Relation Age of Onset  . Heart disease Father   . Stroke Father   . Diabetes Son    Social History  Substance Use Topics  . Smoking status: Never Smoker  . Smokeless tobacco: Never Used  . Alcohol use No   OB History    Gravida Para Term Preterm AB Living   6 6 6     6    SAB TAB Ectopic Multiple Live Births  Review of Systems  Constitutional: Positive for activity change. Negative for fatigue and fever.  HENT: Positive for congestion.   Eyes: Negative.   Respiratory: Positive for shortness of breath. Negative for cough.   Cardiovascular: Positive for chest pain and leg swelling. Negative for palpitations.       Intermittent mild anterior chest pain.  Gastrointestinal: Negative.   Genitourinary: Negative.   Musculoskeletal: Negative.   Skin: Negative.   Neurological: Negative.     Allergies  Review of patient's allergies indicates no known  allergies.  Home Medications   Prior to Admission medications   Medication Sig Start Date End Date Taking? Authorizing Provider  Adapalene (DIFFERIN) 0.1 % LOTN Apply 1 application topically daily.  11/10/14   Historical Provider, MD  albuterol (PROVENTIL HFA;VENTOLIN HFA) 108 (90 Base) MCG/ACT inhaler Inhale 2 puffs into the lungs every 4 (four) hours as needed for wheezing or shortness of breath. 12/20/15   Melony Overly, MD  azithromycin (ZITHROMAX Z-PAK) 250 MG tablet Take 2 pills today, then 1 pill daily until gone. 12/20/15   Melony Overly, MD  fluconazole (DIFLUCAN) 150 MG tablet Take 1 tablet (150 mg total) by mouth once. Take 2nd pill if symptoms not completely resolved in 3 days. 12/20/15   Melony Overly, MD  hydrochlorothiazide (HYDRODIURIL) 25 MG tablet Take 1 tablet (25 mg total) by mouth daily. 07/19/16   Janne Napoleon, NP  HYDROcodone-homatropine [facility]) 5-1.5 MG/5ML syrup Take 5 mLs by mouth every 6 (six) hours as needed for cough. 12/20/15   Melony Overly, MD  lactulose (CEPHULAC) 10 G packet Take 1-2 packets (10-20 g total) by mouth 2 (two) times daily. 08/17/15   Manya Silvas, CNM  naproxen (NAPROSYN) 500 MG tablet Take 1 tablet (500 mg total) by mouth 2 (two) times daily with a meal. 01/06/16   Willeen Niece, MD  OVER THE COUNTER MEDICATION Over the counter cold medicines    Historical Provider, MD  predniSONE (DELTASONE) 50 MG tablet Take 1 pill daily for 5 days. 12/20/15   Melony Overly, MD  sertraline (ZOLOFT) 25 MG tablet Take 1 tablet (25 mg total) by mouth daily. Take for 2 weeks then increase to 50mg  daily. 03/30/16   Lavonia Drafts, MD  sertraline (ZOLOFT) 50 MG tablet Take 1 tablet (50 mg total) by mouth daily. 03/30/16   Lavonia Drafts, MD   Meds Ordered and Administered this Visit  Medications - No data to display  BP 130/72   Pulse 78   Temp 98.6 F (37 C) (Oral)   LMP 06/10/2015   SpO2 98%  No data found.   Physical Exam  Constitutional: She is  oriented to person, place, and time. She appears well-developed and well-nourished. No distress.  HENT:  Head: Normocephalic and atraumatic.  Eyes: EOM are normal.  Neck: Normal range of motion. Neck supple.  Cardiovascular: Normal rate, regular rhythm, normal heart sounds and intact distal pulses.   Pulmonary/Chest: Effort normal and breath sounds normal. No respiratory distress. She has no wheezes. She has no rales.  Musculoskeletal: She exhibits edema.  1+ hitting edema in the lower extremities.  Neurological: She is alert and oriented to person, place, and time.  Skin: Skin is warm and dry. Capillary refill takes less than 2 seconds.  Psychiatric: She has a normal mood and affect.  Nursing note and vitals reviewed.   Urgent Care Course   Clinical Course    Procedures (including critical care time)  Labs Review Labs  Reviewed  POCT I-STAT, CHEM 8 - Abnormal; Notable for the following:       Result Value   Glucose, Bld 115 (*)    All other components within normal limits    Imaging Review Dg Chest 2 View  Result Date: 07/19/2016 CLINICAL DATA:  Dyspnea on exertion, orthopnea, chest pain. EXAM: CHEST  2 VIEW COMPARISON:  03/21/2016 FINDINGS: Mediastinal contours, heart at the upper limits of normal in size but stable. The lungs are clear. No pulmonary edema. No consolidation, pleural effusion, or pneumothorax. No acute osseous abnormalities are seen. IMPRESSION: No acute abnormality. Electronically Signed   By: Jeb Levering M.D.   On: 07/19/2016 20:02     Visual Acuity Review  Right Eye Distance:   Left Eye Distance:   Bilateral Distance:    Right Eye Near:   Left Eye Near:    Bilateral Near:         MDM   1. Shortness of breath   2. Bilateral edema of lower extremity   3. Dyspnea on exertion   The lab testing chest x-ray was negative for any abnormal findings. The reason for your shortness of breath and swelling in her legs is uncertain. He needs to see  her primary care doctor as soon as possible. For worsening, new symptoms or problems, chest pain and shortness of breath or additional evaluation see your primary care doctor or go to the emergency department promptly. There is a risk of taking medications prescribed by a health care provider without obtaining a complete history, labwork or proper physical exam, etc. This cannot be completed adequately at an urgent care. There can be multiple problems, some serious,  associated with medications and undetermined conditions of your health status. This action is performed as a last resort in order to supply you with medication. By receiving these prescriptions you are ackowleging and accepting these risks and will not hold the prescriber or any agent of [facility] as responsible for any adverse outcomes.  It was recommended that you go to the emergency Department for additional evaluation that is not available in the urgent care. The reason for your retaining fluid and shortness of breath is unknown at this time. Possibilities include heart problems, heart attack, problems with her kidneys, blood clotting along or other conditions that may result in death or disability. Your decision was to have the minimum lab work performed here and receive a prescription for hydrochlorothiazide if no contraindication was found. Results for orders placed or performed during the hospital encounter of 07/19/16  I-STAT, chem 8  Result Value Ref Range   Sodium 139 135 - 145 mmol/L   Potassium 4.0 3.5 - 5.1 mmol/L   Chloride 102 101 - 111 mmol/L   BUN 13 6 - 20 mg/dL   Creatinine, Ser 0.90 0.44 - 1.00 mg/dL   Glucose, Bld 115 (H) 65 - 99 mg/dL   Calcium, Ion 1.26 1.15 - 1.40 mmol/L   TCO2 29 0 - 100 mmol/L   Hemoglobin 12.6 12.0 - 15.0 g/dL   HCT 37.0 36.0 - 46.0 %       Janne Napoleon, NP 07/19/16 2052

## 2016-12-22 ENCOUNTER — Emergency Department (HOSPITAL_BASED_OUTPATIENT_CLINIC_OR_DEPARTMENT_OTHER)
Admission: EM | Admit: 2016-12-22 | Discharge: 2016-12-22 | Disposition: A | Discharge status: Home / Self Care | Payer: 59 | Attending: Emergency Medicine | Admitting: Emergency Medicine

## 2016-12-22 ENCOUNTER — Encounter (HOSPITAL_BASED_OUTPATIENT_CLINIC_OR_DEPARTMENT_OTHER): Payer: Self-pay | Admitting: *Deleted

## 2016-12-22 ENCOUNTER — Emergency Department (HOSPITAL_BASED_OUTPATIENT_CLINIC_OR_DEPARTMENT_OTHER): Payer: 59

## 2016-12-22 DIAGNOSIS — I10 Essential (primary) hypertension: Secondary | ICD-10-CM | POA: Insufficient documentation

## 2016-12-22 DIAGNOSIS — Z791 Long term (current) use of non-steroidal anti-inflammatories (NSAID): Secondary | ICD-10-CM | POA: Insufficient documentation

## 2016-12-22 DIAGNOSIS — J069 Acute upper respiratory infection, unspecified: Secondary | ICD-10-CM | POA: Diagnosis not present

## 2016-12-22 DIAGNOSIS — R05 Cough: Secondary | ICD-10-CM | POA: Diagnosis present

## 2016-12-22 DIAGNOSIS — B9789 Other viral agents as the cause of diseases classified elsewhere: Secondary | ICD-10-CM

## 2016-12-22 DIAGNOSIS — Z79899 Other long term (current) drug therapy: Secondary | ICD-10-CM | POA: Insufficient documentation

## 2016-12-22 MED ORDER — BENZONATATE 100 MG PO CAPS
200.0000 mg | ORAL_CAPSULE | Freq: Once | ORAL | Status: AC
  Administered 2016-12-22: 200 mg via ORAL
  Filled 2016-12-22: qty 2

## 2016-12-22 MED ORDER — FLUTICASONE PROPIONATE 50 MCG/ACT NA SUSP: 2.0000 | Freq: Every day | NASAL | 0 refills | Status: DC

## 2016-12-22 MED ORDER — BENZONATATE 100 MG PO CAPS: 100.0000 mg | ORAL_CAPSULE | Freq: Three times a day (TID) | ORAL | 0 refills | Status: DC

## 2016-12-22 NOTE — ED Triage Notes (Signed)
Pt c/o cough x 15 days

## 2016-12-22 NOTE — ED Provider Notes (Signed)
[facility] DEPT [facility] Provider Note   CSN: [id number] Arrival date & time: 12/22/16  0024     History   Chief Complaint Chief Complaint  Patient presents with  . Cough    HPI Lori Green is a 51 y.o. female.  The history is provided by the patient.  Cough  This is a new problem. The current episode started more than 1 week ago. The problem occurs every few minutes. The problem has not changed since onset.The cough is non-productive. There has been no fever. Pertinent negatives include no chest pain, no sweats, no weight loss, no headaches, no myalgias, no shortness of breath and no wheezing. Associated symptoms comments: Nasal congestion and laryngitis. She has tried cough syrup for the symptoms. The treatment provided no relief. She is not a smoker. Her past medical history does not include pneumonia.    Past Medical History:  Diagnosis Date  . Bartholin gland cyst   . Fluid excess   . Hypertension   . Menorrhagia   . SVD (spontaneous vaginal delivery)    x 6    Patient Active Problem List   Diagnosis Date Noted  . Hemorrhagic ovarian cyst 07/19/2015  . Postoperative state 07/19/2015  . Hypertension 09/23/2012  . Obesity 09/23/2012  . Dysfunctional uterine bleeding 09/12/2012    Past Surgical History:  Procedure Laterality Date  . BILATERAL SALPINGECTOMY Bilateral 07/19/2015   Procedure: BILATERAL SALPINGECTOMY;  Surgeon: Lavonia Drafts, MD;  Location: [facility];  Service: Gynecology;  Laterality: Bilateral;  . BREAST REDUCTION SURGERY  04/2003  . BUNIONECTOMY  06/17/12   lt  . CARPAL TUNNEL RELEASE Left 06/25/2014   Procedure: LEFT CARPAL TUNNEL RELEASE;  Surgeon: Daryll Brod, MD;  Location: [place];  Service: Orthopedics;  Laterality: Left;  . DILATATION & CURETTAGE/HYSTEROSCOPY WITH MYOSURE N/A 05/10/2015   Procedure: DILATATION & CURETTAGE/HYSTEROSCOPY WITH MYOSURE;  Surgeon: Lavonia Drafts, MD;  Location: [facility];  Service:  Gynecology;  Laterality: N/A;  . HYSTEROSCOPY WITH NOVASURE N/A 03/01/2014   Procedure: HYSTEROSCOPY WITH NOVASURE;  Surgeon: Lavonia Drafts, MD;  Location: [facility];  Service: Gynecology;  Laterality: N/A;  . OOPHORECTOMY Right 07/19/2015   Procedure: PARTIAL OOPHORECTOMY;  Surgeon: Lavonia Drafts, MD;  Location: [facility];  Service: Gynecology;  Laterality: Right;  . TUBAL LIGATION  01/1994  . VAGINAL HYSTERECTOMY N/A 07/19/2015   Procedure: HYSTERECTOMY VAGINAL;  Surgeon: Lavonia Drafts, MD;  Location: [facility];  Service: Gynecology;  Laterality: N/A;  . WISDOM TOOTH EXTRACTION      OB History    Gravida Para Term Preterm AB Living   6 6 6     6    SAB TAB Ectopic Multiple Live Births                   Home Medications    Prior to Admission medications   Medication Sig Start Date End Date Taking? Authorizing Provider  Adapalene (DIFFERIN) 0.1 % LOTN Apply 1 application topically daily.  11/10/14   Historical Provider, MD  albuterol (PROVENTIL HFA;VENTOLIN HFA) 108 (90 Base) MCG/ACT inhaler Inhale 2 puffs into the lungs every 4 (four) hours as needed for wheezing or shortness of breath. 12/20/15   Melony Overly, MD  azithromycin (ZITHROMAX Z-PAK) 250 MG tablet Take 2 pills today, then 1 pill daily until gone. 12/20/15   Melony Overly, MD  fluconazole (DIFLUCAN) 150 MG tablet Take 1 tablet (150 mg total) by mouth once. Take 2nd pill if symptoms not completely resolved  in 3 days. 12/20/15   Melony Overly, MD  hydrochlorothiazide (HYDRODIURIL) 25 MG tablet Take 1 tablet (25 mg total) by mouth daily. 07/19/16   Janne Napoleon, NP  HYDROcodone-homatropine [facility]) 5-1.5 MG/5ML syrup Take 5 mLs by mouth every 6 (six) hours as needed for cough. 12/20/15   Melony Overly, MD  lactulose (CEPHULAC) 10 G packet Take 1-2 packets (10-20 g total) by mouth 2 (two) times daily. 08/17/15   Manya Silvas, CNM  naproxen (NAPROSYN) 500 MG tablet Take 1 tablet (500 mg total) by mouth 2 (two) times daily  with a meal. 01/06/16   Willeen Niece, MD  OVER THE COUNTER MEDICATION Over the counter cold medicines    Historical Provider, MD  predniSONE (DELTASONE) 50 MG tablet Take 1 pill daily for 5 days. 12/20/15   Melony Overly, MD  sertraline (ZOLOFT) 25 MG tablet Take 1 tablet (25 mg total) by mouth daily. Take for 2 weeks then increase to 50mg  daily. 03/30/16   Lavonia Drafts, MD  sertraline (ZOLOFT) 50 MG tablet Take 1 tablet (50 mg total) by mouth daily. 03/30/16   Lavonia Drafts, MD    Family History Family History  Problem Relation Age of Onset  . Heart disease Father   . Stroke Father   . Diabetes Son     Social History Social History  Substance Use Topics  . Smoking status: Never Smoker  . Smokeless tobacco: Never Used  . Alcohol use No     Allergies   Patient has no known allergies.   Review of Systems Review of Systems  Constitutional: Negative for fever and weight loss.  Respiratory: Positive for cough. Negative for shortness of breath, wheezing and stridor.   Cardiovascular: Negative for chest pain, palpitations and leg swelling.  Musculoskeletal: Negative for myalgias.  Neurological: Negative for headaches.  All other systems reviewed and are negative.    Physical Exam Updated Vital Signs BP 167/94 (BP Location: Left Arm)   Pulse 87   Temp 97.8 F (36.6 C) (Oral)   Resp 16   Ht 5\' 4"  (1.626 m)   Wt 189 lb (85.7 kg)   LMP 06/10/2015   SpO2 100%   BMI 32.44 kg/m   Physical Exam  Constitutional: She is oriented to person, place, and time. She appears well-developed and well-nourished. No distress.  HENT:  Head: Normocephalic and atraumatic.  Mouth/Throat: No oropharyngeal exudate.  Clear colorless post nasal drip  Eyes: Conjunctivae and EOM are normal. Pupils are equal, round, and reactive to light.  Neck: Normal range of motion. Neck supple.  Cardiovascular: Normal rate, regular rhythm and intact distal pulses.   Pulmonary/Chest: Effort  normal and breath sounds normal. No stridor. No respiratory distress. She has no wheezes. She has no rales.  Abdominal: Soft. Bowel sounds are normal. She exhibits no mass. There is no tenderness. There is no guarding.  Musculoskeletal: Normal range of motion. She exhibits no tenderness.  Lymphadenopathy:    She has no cervical adenopathy.  Neurological: She is alert and oriented to person, place, and time.  Skin: Skin is warm and dry. Capillary refill takes less than 2 seconds.  Psychiatric: She has a normal mood and affect.     ED Treatments / Results   Vitals:   12/22/16 0027  BP: 167/94  Pulse: 87  Resp: 16  Temp: 97.8 F (36.6 C)    Radiology Dg Chest 2 View  Result Date: 12/22/2016 CLINICAL DATA:  Subacute onset of cough.  Initial encounter. EXAM: CHEST  2 VIEW COMPARISON:  Chest radiograph performed 07/19/2016 FINDINGS: The lungs are well-aerated. Pulmonary vascularity is at the upper limits of normal. There is no evidence of focal opacification, pleural effusion or pneumothorax. The heart is normal in size; the mediastinal contour is within normal limits. No acute osseous abnormalities are seen. IMPRESSION: No acute cardiopulmonary process seen. Electronically Signed   By: Garald Balding M.D.   On: 12/22/2016 01:09    Procedures Procedures (including critical care time)  Medications Ordered in ED Medications  benzonatate (TESSALON) capsule 200 mg (not administered)    PERC negative wells 0 highly doubt PE in this very low risk patient with clear alternate diagnosis.   Final Clinical Impressions(s) / ED Diagnoses  Viral illness: constellation of symptoms is clearly viral no signs on exam or xray of bacterial infection.  No indication for antibiotics.  Will treat symptomatically.   All questions answered to patient's satisfaction. Based on history and exam patient has been appropriately medically screened and emergency conditions excluded. Patient is stable for discharge  at this time. Strict return precautions given for any further episodes, persistent fever, weakness or any concerns. New Prescriptions New Prescriptions   No medications on file     Finola Rosal, MD 12/22/16 0206

## 2017-03-28 ENCOUNTER — Encounter (HOSPITAL_BASED_OUTPATIENT_CLINIC_OR_DEPARTMENT_OTHER): Payer: Self-pay

## 2017-03-28 ENCOUNTER — Emergency Department (HOSPITAL_BASED_OUTPATIENT_CLINIC_OR_DEPARTMENT_OTHER): Payer: 59

## 2017-03-28 ENCOUNTER — Emergency Department (HOSPITAL_BASED_OUTPATIENT_CLINIC_OR_DEPARTMENT_OTHER)
Admission: EM | Admit: 2017-03-28 | Discharge: 2017-03-28 | Disposition: A | Discharge status: Home / Self Care | Payer: 59 | Attending: Emergency Medicine | Admitting: Emergency Medicine

## 2017-03-28 DIAGNOSIS — R079 Chest pain, unspecified: Secondary | ICD-10-CM | POA: Diagnosis present

## 2017-03-28 DIAGNOSIS — I10 Essential (primary) hypertension: Secondary | ICD-10-CM | POA: Insufficient documentation

## 2017-03-28 DIAGNOSIS — G43909 Migraine, unspecified, not intractable, without status migrainosus: Secondary | ICD-10-CM | POA: Diagnosis not present

## 2017-03-28 LAB — CBC WITH DIFFERENTIAL/PLATELET
BASOS PCT: 0 %
Basophils Absolute: 0 10*3/uL (ref 0.0–0.1)
EOS ABS: 0 10*3/uL (ref 0.0–0.7)
EOS PCT: 0 %
HCT: 38.7 % (ref 36.0–46.0)
Hemoglobin: 13.1 g/dL (ref 12.0–15.0)
LYMPHS ABS: 3.9 10*3/uL (ref 0.7–4.0)
Lymphocytes Relative: 52 %
MCH: 28.9 pg (ref 26.0–34.0)
MCHC: 33.9 g/dL (ref 30.0–36.0)
MCV: 85.4 fL (ref 78.0–100.0)
Monocytes Absolute: 0.7 10*3/uL (ref 0.1–1.0)
Monocytes Relative: 9 %
Neutro Abs: 3 10*3/uL (ref 1.7–7.7)
Neutrophils Relative %: 39 %
PLATELETS: 336 10*3/uL (ref 150–400)
RBC: 4.53 MIL/uL (ref 3.87–5.11)
RDW: 13.8 % (ref 11.5–15.5)
WBC: 7.6 10*3/uL (ref 4.0–10.5)

## 2017-03-28 LAB — COMPREHENSIVE METABOLIC PANEL
ALK PHOS: 88 U/L (ref 38–126)
ALT: 16 U/L (ref 14–54)
ANION GAP: 7 (ref 5–15)
AST: 21 U/L (ref 15–41)
Albumin: 4.2 g/dL (ref 3.5–5.0)
BUN: 10 mg/dL (ref 6–20)
CALCIUM: 9.6 mg/dL (ref 8.9–10.3)
CHLORIDE: 102 mmol/L (ref 101–111)
CO2: 26 mmol/L (ref 22–32)
Creatinine, Ser: 0.78 mg/dL (ref 0.44–1.00)
GFR calc non Af Amer: >60 mL/min (ref >60)
Glucose, Bld: 100 mg/dL — ABNORMAL HIGH (ref 65–99)
Potassium: 4.3 mmol/L (ref 3.5–5.1)
SODIUM: 135 mmol/L (ref 135–145)
Total Bilirubin: 0.3 mg/dL (ref 0.3–1.2)
Total Protein: 8.4 g/dL — ABNORMAL HIGH (ref 6.5–8.1)

## 2017-03-28 LAB — TROPONIN I
Troponin I: <0.03 ng/mL (ref <0.03)
Troponin I: <0.03 ng/mL (ref <0.03)

## 2017-03-28 MED ORDER — KETOROLAC TROMETHAMINE 30 MG/ML IJ SOLN
30.0000 mg | Freq: Once | INTRAMUSCULAR | Status: AC
  Administered 2017-03-28: 30 mg via INTRAVENOUS
  Filled 2017-03-28: qty 1

## 2017-03-28 MED ORDER — DIPHENHYDRAMINE HCL 25 MG PO CAPS
25.0000 mg | ORAL_CAPSULE | Freq: Once | ORAL | Status: AC
  Administered 2017-03-28: 25 mg via ORAL
  Filled 2017-03-28: qty 1

## 2017-03-28 MED ORDER — METOCLOPRAMIDE HCL 10 MG PO TABS
10.0000 mg | ORAL_TABLET | Freq: Once | ORAL | Status: AC
  Administered 2017-03-28: 10 mg via ORAL
  Filled 2017-03-28: qty 1

## 2017-03-28 NOTE — ED Provider Notes (Signed)
Patient care transferred at end of shift from [facility] pending delta troponin with plan to discharge if negative. Labs reviewed and unremarkable, stable vital signs. Delta troponin negative. Patient was discharged.   Emeline General, PA-C 03/28/17 [place], Nathan, MD 03/28/17 2251

## 2017-03-28 NOTE — ED Notes (Signed)
ED Provider at bedside. 

## 2017-03-28 NOTE — Discharge Instructions (Signed)
Please read attached information. If you experience any new or worsening signs or symptoms please return to the emergency room for evaluation. Please follow-up with your primary care provider and cardiology specialist as discussed.

## 2017-03-28 NOTE — ED Notes (Signed)
Patient placed on monitor every 62mins

## 2017-03-28 NOTE — ED Provider Notes (Signed)
[facility] Provider Note   CSN: [id number] Arrival date & time: 03/28/17  1332     History   Chief Complaint Chief Complaint  Patient presents with  . Chest Pain    HPI Lori Green is a 51 y.o. female.  HPI   51 year old female presents today with numerous complaints. Patient notes that since Sunday she's had bilateral aching sensation in her temples. She notes this is similar to previous headaches she's had in the past. Usually prior to onset of symptoms she has minor tingling in her bilateral fingers, some nausea and seeing spots. She notes the headache which normally is improved with rest and ibuprofen has not improved with normal medications. She reports this is identical to previous, she denies any present neurological deficits, denies fever, neck stiffness, trauma, or any other red flags for headache. Patient notes taking ibuprofen, Aleve, Excedrin with no significant improvement in his symptoms.  Patient also notes that for several years she's had intermittent chest pain, most recently this morning while at work, and 4 days ago. She has been seen at several urgent cares most recently in September of last year for similar presentation. She notes that symptoms come on generally with stress, but sometimes come on without any propagating factor. She notes this starts in her central chest with radiation of pain to the back, moving over to the left side of her chest. She notes it lasts for approximately 10-15 minutes and then resolved completely on its own. She denies any associated indigestion, abdominal pain, reports this feels like a ache in her chest and pressure. Patient notes that exertion does not make her symptoms worse. She notes she tends to walk on a regular basis, but gets short of breath easily with this. She notes a hysterectomy that has caused lower back pain so her ability to ambulate significant distances without pain is limited. Patient also notes a  long-standing history of lower extremity swelling. She notes she was taking HCTZ in the past but does not have any due to no primary care follow-up. Patient denies any diagnosis of hypertension, though was hypertensive at the administration of HCTZ. She denies any diagnosis of hyperlipidemia, but has not had her cholesterol checked recently.  Patient notes sometimes when she lays flat she does have some shortness of breath.    Patient notes she was hospitalized in 2012 for chest pain, had an overnight workup and was discharged home with no significant findings. She has not followed up with cardiologist since.  Patient had an echocardiogram in 2007 which showed no significant findings.  Headache Sunday ibuprofen aleve, excedrine- ache pain bilateral temples- was coming and going- hisotry of the same- some tingling in the fingers usually comes before headaches normal for patient- soemtienms sees spots and starts- nauseous - headache is what brings her in   Patient denies smoking, reports her father died of a stroke at the age of 35, mother is healthy, 3 siblings are healthy with no cardiovascular disease. She denies any drug use.     Past Medical History:  Diagnosis Date  . Bartholin gland cyst   . Fluid excess   . Hypertension   . Menorrhagia   . SVD (spontaneous vaginal delivery)    x 6    Patient Active Problem List   Diagnosis Date Noted  . Hemorrhagic ovarian cyst 07/19/2015  . Postoperative state 07/19/2015  . Hypertension 09/23/2012  . Obesity 09/23/2012  . Dysfunctional uterine bleeding 09/12/2012    Past  Surgical History:  Procedure Laterality Date  . BILATERAL SALPINGECTOMY Bilateral 07/19/2015   Procedure: BILATERAL SALPINGECTOMY;  Surgeon: Lavonia Drafts, MD;  Location: [facility];  Service: Gynecology;  Laterality: Bilateral;  . BREAST REDUCTION SURGERY  04/2003  . BUNIONECTOMY  06/17/12   lt  . CARPAL TUNNEL RELEASE Left 06/25/2014   Procedure: LEFT CARPAL TUNNEL  RELEASE;  Surgeon: Daryll Brod, MD;  Location: [facility];  Service: Orthopedics;  Laterality: Left;  . DILATATION & CURETTAGE/HYSTEROSCOPY WITH MYOSURE N/A 05/10/2015   Procedure: DILATATION & CURETTAGE/HYSTEROSCOPY WITH MYOSURE;  Surgeon: Lavonia Drafts, MD;  Location: [facility];  Service: Gynecology;  Laterality: N/A;  . HYSTEROSCOPY WITH NOVASURE N/A 03/01/2014   Procedure: HYSTEROSCOPY WITH NOVASURE;  Surgeon: Lavonia Drafts, MD;  Location: [facility];  Service: Gynecology;  Laterality: N/A;  . OOPHORECTOMY Right 07/19/2015   Procedure: PARTIAL OOPHORECTOMY;  Surgeon: Lavonia Drafts, MD;  Location: [facility];  Service: Gynecology;  Laterality: Right;  . TUBAL LIGATION  01/1994  . VAGINAL HYSTERECTOMY N/A 07/19/2015   Procedure: HYSTERECTOMY VAGINAL;  Surgeon: Lavonia Drafts, MD;  Location: [facility];  Service: Gynecology;  Laterality: N/A;  . WISDOM TOOTH EXTRACTION      OB History    Gravida Para Term Preterm AB Living   6 6 6     6    SAB TAB Ectopic Multiple Live Births                   Home Medications    Prior to Admission medications   Not on File    Family History Family History  Problem Relation Age of Onset  . Heart disease Father   . Stroke Father   . Diabetes Son     Social History Social History  Substance Use Topics  . Smoking status: Never Smoker  . Smokeless tobacco: Never Used  . Alcohol use No     Allergies   Patient has no known allergies.   Review of Systems Review of Systems  All other systems reviewed and are negative.   Physical Exam Updated Vital Signs BP 131/72   Pulse 67   Resp 14   Ht 5\' 4"  (1.626 m)   Wt 105.2 kg (232 lb)   LMP 06/10/2015   SpO2 96%   BMI 39.82 kg/m   Physical Exam  Constitutional: She is oriented to person, place, and time. She appears well-developed and well-nourished.  HENT:  Head: Normocephalic and atraumatic.  Eyes: Conjunctivae are normal. Pupils are equal, round,  and reactive to light. Right eye exhibits no discharge. Left eye exhibits no discharge. No scleral icterus.  Neck: Normal range of motion. No JVD present. No tracheal deviation present.  Cardiovascular: Normal rate, regular rhythm, normal heart sounds and intact distal pulses.  Exam reveals no gallop and no friction rub.   No murmur heard. Pulmonary/Chest: Effort normal and breath sounds normal. No stridor. No respiratory distress. She has no wheezes. She has no rales. She exhibits no tenderness.  Abdominal: Soft. She exhibits no distension. There is no tenderness.  Musculoskeletal:  Scant pedal edema  Neurological: She is alert and oriented to person, place, and time. Coordination normal.  Psychiatric: She has a normal mood and affect. Her behavior is normal. Judgment and thought content normal.  Nursing note and vitals reviewed.   ED Treatments / Results  Labs (all labs ordered are listed, but only abnormal results are displayed) Labs Reviewed  COMPREHENSIVE METABOLIC PANEL - Abnormal; Notable for the following:  Result Value   Glucose, Bld 100 (*)    Total Protein 8.4 (*)    All other components within normal limits  TROPONIN I  CBC WITH DIFFERENTIAL/PLATELET  TROPONIN I    EKG  EKG Interpretation  Date/Time:  Thursday Mar 28 2017 13:37:31 EDT Ventricular Rate:  69 PR Interval:  148 QRS Duration: 82 QT Interval:  378 QTC Calculation: 405 R Axis:   5 Text Interpretation:  Normal sinus rhythm Normal ECG No significant change since last tracing Confirmed by LIU MD, DANA [id number]) on 03/28/2017 1:41:11 PM       Radiology Dg Chest 2 View  Result Date: 03/28/2017 CLINICAL DATA:  Chest pain and headache today. EXAM: CHEST  2 VIEW COMPARISON:  PA and lateral chest 12/22/2016 and 07/19/2016. FINDINGS: The lungs are clear. Heart size is normal. No pneumothorax or pleural effusion. No acute bony abnormality. IMPRESSION: No acute disease. Electronically Signed   By: Inge Rise M.D.   On: 03/28/2017 14:06    Procedures Procedures (including critical care time)  Medications Ordered in ED Medications  ketorolac (TORADOL) 30 MG/ML injection 30 mg (30 mg Intravenous Given 03/28/17 1421)  metoCLOPramide (REGLAN) tablet 10 mg (10 mg Oral Given 03/28/17 1421)  diphenhydrAMINE (BENADRYL) capsule 25 mg (25 mg Oral Given 03/28/17 1420)     Initial Impression / Assessment and Plan / ED Course  I have reviewed the triage vital signs and the nursing notes.  Pertinent labs & imaging results that were available during my care of the patient were reviewed by me and considered in my medical decision making (see chart for details).      Final Clinical Impressions(s) / ED Diagnoses   Final diagnoses:  Migraine without status migrainosus, not intractable, unspecified migraine type  Chest pain, unspecified type    Labs: Troponin, CBC, CMP  Imaging: DG chest 2 view  Consults:  Therapeutics: Toradol, Reglan, diphenhydramine   Discharge Meds:   Assessment/Plan:  51 year old female presents today with symptoms complaints. Patient is having what appears to be a migraine. This is typical of her previous. She'll be treated with Toradol, Reglan, Benadryl. No red flags for headache at this time. Patient also noting chest pain. She has been seen numerous times for this in the past, has not followed up with cardiology. Today's presentation is typical of her previous. Patient is asymptomatic at the time of my evaluation. Patient has a low heart score here, no worsening of symptoms or change in pattern recently. Patient's initial troponin and EKG reassuring with a heart score of 3. Patient will have delta troponin and will likely be discharged home with outpatient cardiology follow-up. I extensively counseled patient on the need for close follow-up with cardiology as she has not had any recent primary care, or cardiac evaluation in many years. Patient verbalized that she would  follow cardiology as an outpatient. Patient is given strict return precautions and event new worsening signs or symptoms present, she verbalized understanding and agreement to today's plan had no further questions or concerns.     New Prescriptions New Prescriptions   No medications on file     Okey Regal, Hershal Coria 03/28/17 1639    Forde Dandy, MD 04/02/17 [phone]

## 2017-03-28 NOTE — ED Triage Notes (Signed)
C/o CP x 4-5 days-NAD-steady gait

## 2017-04-28 NOTE — Progress Notes (Signed)
[facility] at [facility] [address], [place], [place] [address] [phone]  Date:  04/29/2017   Name:  Lori Green   DOB:  October 06, 1966   MRN:  [id number]  PCP:  Darreld Mclean, MD    Chief Complaint: Establish Care   History of Present Illness:  Lori Green is a 51 y.o. very pleasant female patient who presents with the following:  Here today as a new patient.  History of HTN, obesity.  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   About a month ago she was seen in the ER with migraine HA and chest pains.  Over the last 2-4 months she has started getting a couple of migraine type HA a week. She might try excedrin migraine as needed - this helps some of the time In the ER she was treated with an IV medication as asked to follow-up with PCP  No aura or other neurological sx with her HA, but she will have phono and photophobia and sometimes nausea and vomiting  Her HA continue to occur twice a week since her ER visit.  She has to lie down and rest in a dark room.   She does have a cardiologist- Dr. Stanford Breed- but she has not seen him in a long time When she was in the ER last month she also had complaint of cramps in her chest. She has not had any more CP since her ER visit a month ago however.  The ER asked her to to follow-up with cardiology and she is willing to do so Notes that she would like to lose weight but admits that "I want to eat all the time."  Wonders if we can rx some sort of appetite suppressant for her  S/o hysterectomy in 2016- she still does have her ovaries .  Part of her right ovary was removed when she had her hyst due to cyst  She has 6 grown children and 5 grands   She is not fasting today  Patient Active Problem List    Diagnosis Date Noted  . Migraine without aura and without status migrainosus, not intractable 04/29/2017  . Hemorrhagic ovarian cyst 07/19/2015  . Postoperative state 07/19/2015  . Hypertension 09/23/2012  . Obesity 09/23/2012  . Dysfunctional uterine bleeding 09/12/2012    Past Medical History:  Diagnosis Date  . Bartholin gland cyst   . Fluid excess   . Hypertension   . Menorrhagia   . SVD (spontaneous vaginal delivery)    x 6    Past Surgical History:  Procedure Laterality Date  . BILATERAL SALPINGECTOMY Bilateral 07/19/2015   Procedure: BILATERAL SALPINGECTOMY;  Surgeon: Lavonia Drafts, MD;  Location: [facility];  Service: Gynecology;  Laterality: Bilateral;  . BREAST REDUCTION SURGERY  04/2003  . BUNIONECTOMY  06/17/12   lt  . CARPAL TUNNEL RELEASE Left 06/25/2014   Procedure: LEFT CARPAL TUNNEL RELEASE;  Surgeon: Daryll Brod, MD;  Location: [facility];  Service: Orthopedics;  Laterality: Left;  . DILATATION & CURETTAGE/HYSTEROSCOPY WITH MYOSURE N/A 05/10/2015   Procedure: DILATATION & CURETTAGE/HYSTEROSCOPY WITH MYOSURE;  Surgeon: Lavonia Drafts, MD;  Location: [facility];  Service: Gynecology;  Laterality: N/A;  . HYSTEROSCOPY WITH NOVASURE N/A  03/01/2014   Procedure: HYSTEROSCOPY WITH NOVASURE;  Surgeon: Lavonia Drafts, MD;  Location: [facility];  Service: Gynecology;  Laterality: N/A;  . OOPHORECTOMY Right 07/19/2015   Procedure: PARTIAL OOPHORECTOMY;  Surgeon: Lavonia Drafts, MD;  Location: [facility];  Service: Gynecology;  Laterality: Right;  Marland Kitchen VAGINAL HYSTERECTOMY N/A 07/19/2015   Procedure: HYSTERECTOMY VAGINAL;  Surgeon: Lavonia Drafts, MD;  Location: [facility];  Service: Gynecology;  Laterality: N/A;  . WISDOM TOOTH EXTRACTION      Social History  Substance Use Topics  . Smoking status: Never Smoker  . Smokeless tobacco: Never Used  . Alcohol use No    Family History  Problem Relation Age of Onset  . Heart disease Father    . Stroke Father   . Diabetes Son     No Known Allergies  Medication list has been reviewed and updated.  No current outpatient prescriptions on file prior to visit.   No current facility-administered medications on file prior to visit.     Review of Systems:  As per HPI- otherwise negative. No current CP or SOB No fever or chills No rash, ST, cough   Physical Examination: Vitals:   04/29/17 1500  BP: 137/60  Pulse: 95  Temp: 98.5 F (36.9 C)   Vitals:   04/29/17 1500  Weight: 233 lb 6.4 oz (105.9 kg)  Height: 5\' 4"  (1.626 m)   Body mass index is 40.06 kg/m. Ideal Body Weight: Weight in (lb) to have BMI = 25: 145.3  GEN: WDWN, NAD, Non-toxic, A & O x 3 HEENT: Atraumatic, Normocephalic. Neck supple. No masses, No LAD.  Bilateral TM wnl, oropharynx normal.  PEERL,EOMI.   Ears and Nose: No external deformity. CV: RRR, No M/G/R. No JVD. No thrill. No extra heart sounds. PULM: CTA B, no wheezes, crackles, rhonchi. No retractions. No resp. distress. No accessory muscle use. EXTR: No c/c/e NEURO Normal gait.  PSYCH: Normally interactive. Conversant. Not depressed or anxious appearing.  Calm demeanor.  Obese, otherwise looks well Normal strength and DTR of all extremities.  Negative romberg   Assessment and Plan: Migraine without aura and without status migrainosus, not intractable - Plan: butalbital-acetaminophen-caffeine (FIORICET, ESGIC) 50-325-40 MG tablet  Chest pain in adult - Plan: Ambulatory referral to Cardiology  Obesity, unspecified obesity severity, unspecified obesity type  Referral back to cardiology for chest pain No active CP at this time.  Would like to use a triptan for her but will have her see cariology first Trial of fioricet prn for her headaches  Asked her to please let me know how she is doing after a couple of weeks Otherwise follow-up after cardiology appt   Signed Lamar Blinks, MD

## 2017-04-29 ENCOUNTER — Ambulatory Visit (INDEPENDENT_AMBULATORY_CARE_PROVIDER_SITE_OTHER): Payer: 59 | Admitting: Family Medicine

## 2017-04-29 ENCOUNTER — Encounter: Payer: Self-pay | Admitting: Family Medicine

## 2017-04-29 VITALS — BP 137/60 | HR 95 | Temp 98.5°F | Ht 64.0 in | Wt 233.4 lb

## 2017-04-29 DIAGNOSIS — G43009 Migraine without aura, not intractable, without status migrainosus: Secondary | ICD-10-CM | POA: Diagnosis not present

## 2017-04-29 DIAGNOSIS — R079 Chest pain, unspecified: Secondary | ICD-10-CM | POA: Diagnosis not present

## 2017-04-29 DIAGNOSIS — E669 Obesity, unspecified: Secondary | ICD-10-CM | POA: Diagnosis not present

## 2017-04-29 MED ORDER — BUTALBITAL-APAP-CAFFEINE 50-325-40 MG PO TABS: 1.0000 | ORAL_TABLET | Freq: Four times a day (QID) | ORAL | 0 refills | Status: DC | PRN

## 2017-04-29 NOTE — Progress Notes (Signed)
Pre visit review using our clinic tool,if applicable. No additional management support is needed unless otherwise documented below in the visit note.  

## 2017-04-29 NOTE — Patient Instructions (Signed)
It was very nice to meet you today Take care and I will refer you to cardiology to make sure all is well with your heart Try the fiorcet as needed for headache- try to take at the first sign.  This medication can sedating however so keep this in mind  Please let me know how you are doing after a couple of weeks- Sooner if worse.  We can try a "triptan" migraine med later on assuming that your heart checks out ok

## 2017-05-13 NOTE — Progress Notes (Signed)
Referring-Jessica C Copland, MD Reason for referral-Chest pain  HPI: 51 yo female for evaluation of chest pain at request of Gay Filler Copland MD. Echo 2007 showed normal LV function. Seen in ER 03/28/17 with chest pain. Troponins normal; Hgb 13.1, renal function and LFTs normal. Patient states she has had chest pain for approximately 5-6 years. It is in the left upper chest without radiation. No associated symptoms. Described as a sharp pain lasting approximately 1 minute. It is not pleuritic, positional or exertional. Occasionally related to food. She has dyspnea on exertion but no orthopnea or PND. Occasional mild pedal edema. Because of the above we were asked to evaluate.  Current Outpatient Prescriptions  Medication Sig Dispense Refill  . butalbital-acetaminophen-caffeine (FIORICET, ESGIC) 50-325-40 MG tablet Take 1-2 tablets by mouth every 6 (six) hours as needed for headache. Max 6 per 24 hours 20 tablet 0   No current facility-administered medications for this visit.     No Known Allergies   Past Medical History:  Diagnosis Date  . Bartholin gland cyst   . Fluid excess   . Hypertension   . Menorrhagia   . SVD (spontaneous vaginal delivery)    x 6    Past Surgical History:  Procedure Laterality Date  . BILATERAL SALPINGECTOMY Bilateral 07/19/2015   Procedure: BILATERAL SALPINGECTOMY;  Surgeon: Lavonia Drafts, MD;  Location: [facility];  Service: Gynecology;  Laterality: Bilateral;  . BREAST REDUCTION SURGERY  04/2003  . BUNIONECTOMY  06/17/12   lt  . CARPAL TUNNEL RELEASE Left 06/25/2014   Procedure: LEFT CARPAL TUNNEL RELEASE;  Surgeon: Daryll Brod, MD;  Location: [facility];  Service: Orthopedics;  Laterality: Left;  . DILATATION & CURETTAGE/HYSTEROSCOPY WITH MYOSURE N/A 05/10/2015   Procedure: DILATATION & CURETTAGE/HYSTEROSCOPY WITH MYOSURE;  Surgeon: Lavonia Drafts, MD;  Location: [facility];  Service: Gynecology;  Laterality: N/A;  . HYSTEROSCOPY  WITH NOVASURE N/A 03/01/2014   Procedure: HYSTEROSCOPY WITH NOVASURE;  Surgeon: Lavonia Drafts, MD;  Location: [facility];  Service: Gynecology;  Laterality: N/A;  . OOPHORECTOMY Right 07/19/2015   Procedure: PARTIAL OOPHORECTOMY;  Surgeon: Lavonia Drafts, MD;  Location: [facility];  Service: Gynecology;  Laterality: Right;  Marland Kitchen VAGINAL HYSTERECTOMY N/A 07/19/2015   Procedure: HYSTERECTOMY VAGINAL;  Surgeon: Lavonia Drafts, MD;  Location: [facility];  Service: Gynecology;  Laterality: N/A;  . WISDOM TOOTH EXTRACTION      Social History   Social History  . Marital status: Single    Spouse name: N/A  . Number of children: 6  . Years of education: N/A   Occupational History  . Not on file.   Social History Main Topics  . Smoking status: Never Smoker  . Smokeless tobacco: Never Used  . Alcohol use No  . Drug use: No  . Sexual activity: Not on file   Other Topics Concern  . Not on file   Social History Narrative  . No narrative on file    Family History  Problem Relation Age of Onset  . Heart disease Father   . Stroke Father   . Diabetes Son     ROS: no fevers or chills, productive cough, hemoptysis, dysphasia, odynophagia, melena, hematochezia, dysuria, hematuria, rash, seizure activity, orthopnea, PND, pedal edema, claudication. Remaining systems are negative.  Physical Exam:   Blood pressure 138/66, pulse 84, height 5\' 4"  (1.626 m), weight 105.7 kg (233 lb), last menstrual period 06/10/2015.  General:  Well developed/obese in NAD Skin warm/dry Patient not depressed No peripheral clubbing  Back-normal HEENT-normal/normal eyelids Neck supple/normal carotid upstroke bilaterally; no bruits; no JVD; no thyromegaly chest - CTA/ normal expansion CV - RRR/normal S1 and S2; no murmurs, rubs or gallops;  PMI nondisplaced Abdomen -NT/ND, no HSM, no mass, + bowel sounds, no bruit 2+ femoral pulses, no bruits Ext-no edema, chords, 2+ DP Neuro-grossly nonfocal  ECG  - Sinus rhythm at a rate of 84. No ST changes. personally reviewed  A/P  1 chest pain-symptoms are atypical. We will arrange an exercise treadmill for risk stratification. Echocardiogram to assess wall motion.  2 dyspnea-arrange echocardiogram to assess LV function.  3 history of hypertension-blood pressure is borderline. Follow as an outpatient and medications per primary care as needed.   Kirk Ruths, MD

## 2017-05-22 IMAGING — CT CT ABD-PELV W/ CM
2 of 5 series · 17 of 46 positions shown, 19 images · IV contrast (APPLIED)
Comparison: Acute abdominal series 06/17/2015

CLINICAL DATA: Constipation and bloating for 2 weeks. Nausea and
mild abdominal pain.

EXAM:
CT ABDOMEN AND PELVIS WITH CONTRAST
TECHNIQUE: Multidetector CT imaging of the abdomen and pelvis was performed
using the standard protocol following bolus administration of
intravenous contrast.
CONTRAST:  25mL OMNIPAQUE IOHEXOL 300 MG/ML SOLN, 100mL OMNIPAQUE
IOHEXOL 300 MG/ML SOLN

[Series 2: abd/pelvis 5.0 b31f · axial · 0.82mm/px · z∈[-506,-142]mm · 14 of 83 slices shown, 16 images]
[im 5/83  soft-tissue]
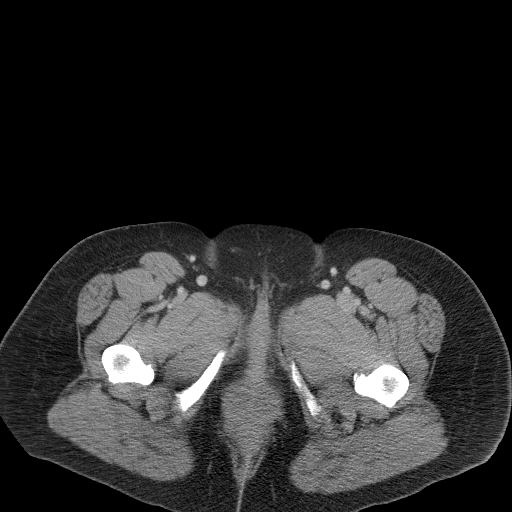
[im 5/83  bone]
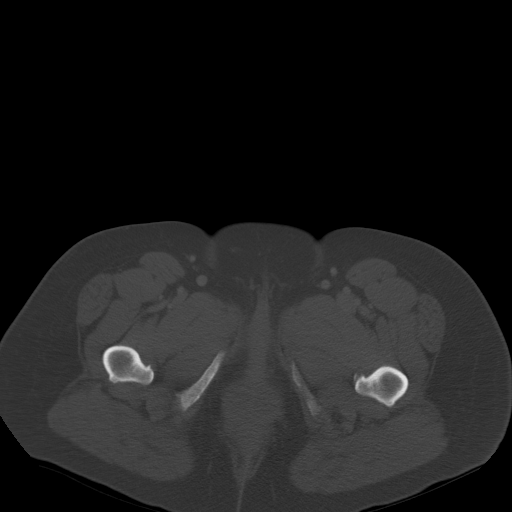
[im 9/83  soft-tissue]
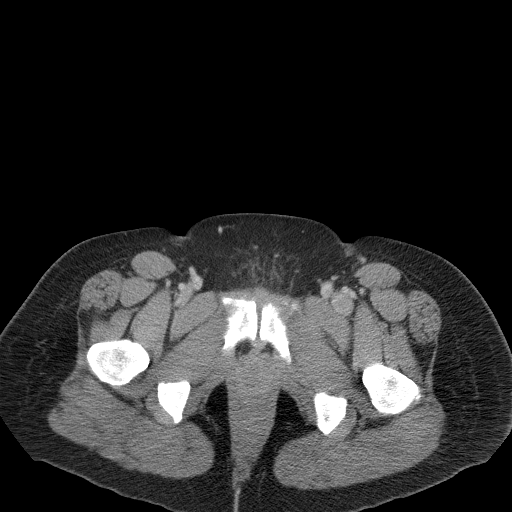
[im 18/83  soft-tissue]
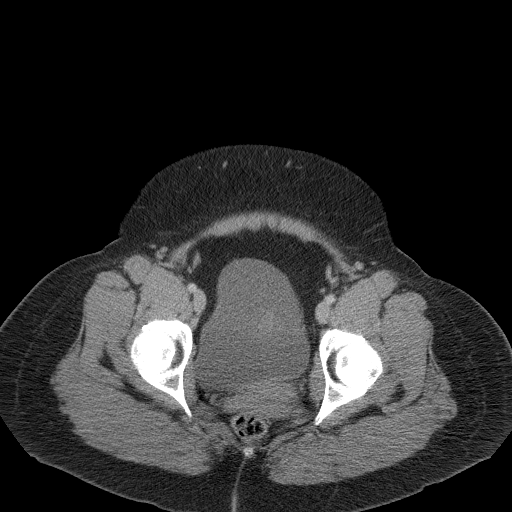
[im 22/83  soft-tissue]
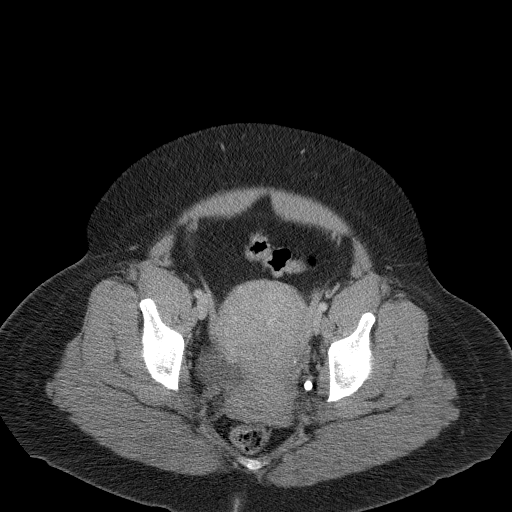
[im 26/83  soft-tissue]
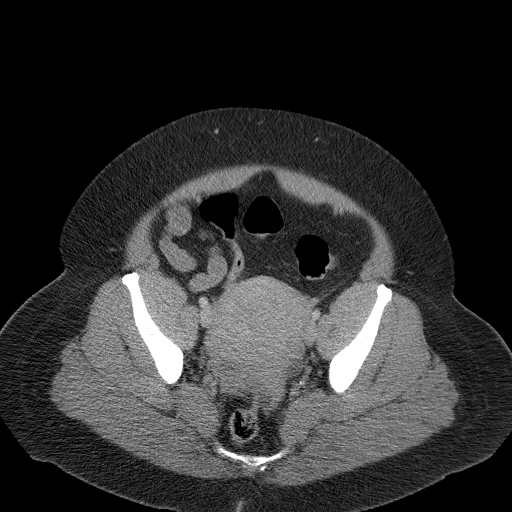
[im 35/83  soft-tissue]
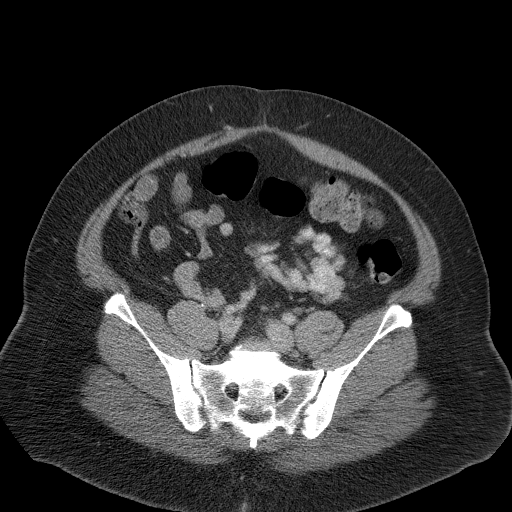
[im 39/83  soft-tissue]
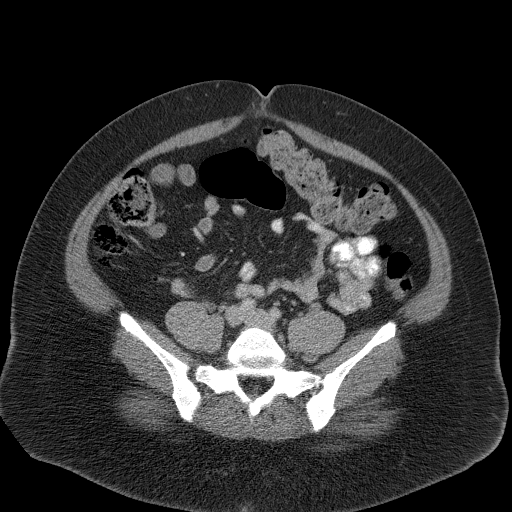
[im 44/83  soft-tissue]
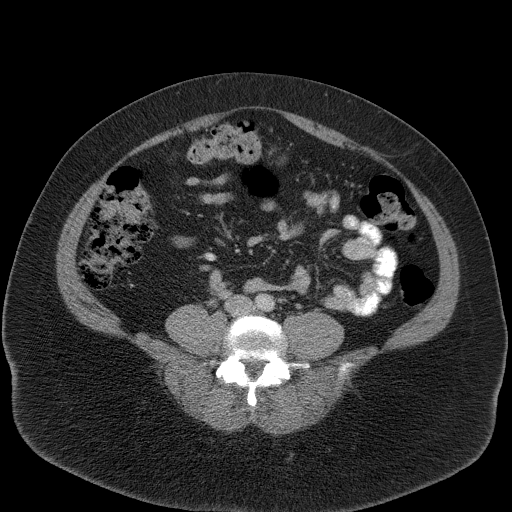
[im 48/83  soft-tissue]
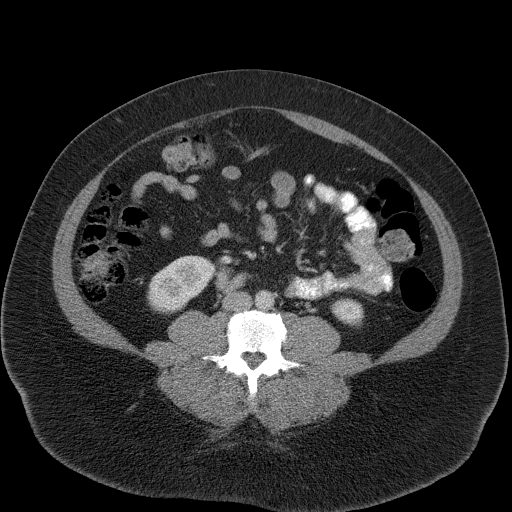
[im 48/83  bone]
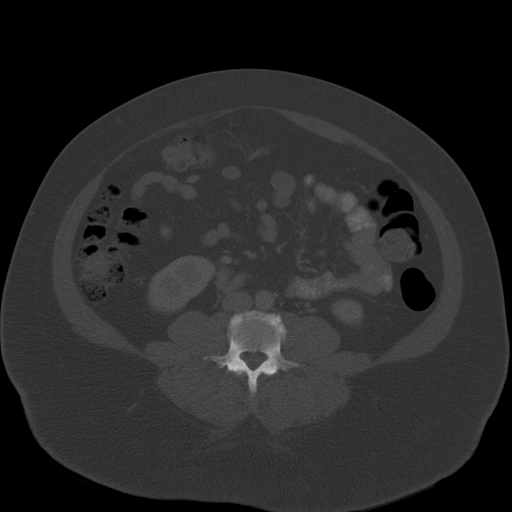
[im 57/83  soft-tissue]
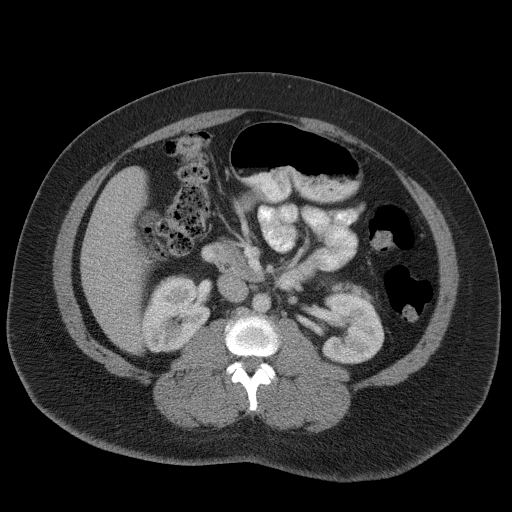
[im 61/83  soft-tissue]
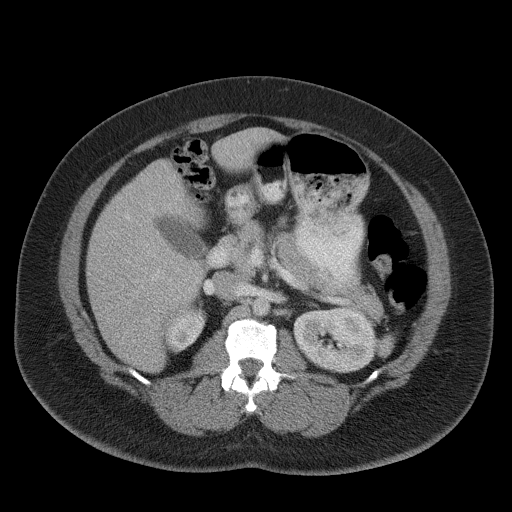
[im 65/83  soft-tissue]
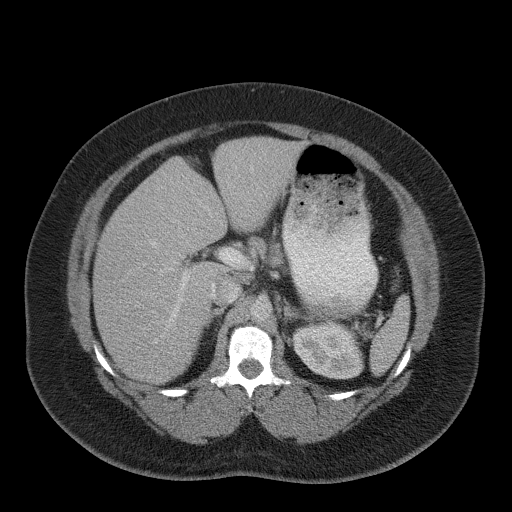
[im 74/83  soft-tissue]
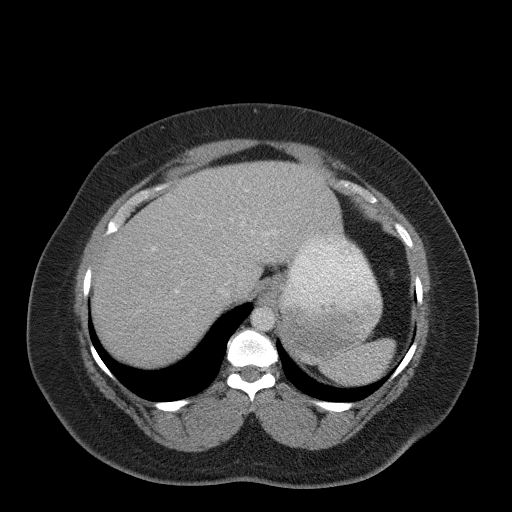
[im 78/83  soft-tissue]
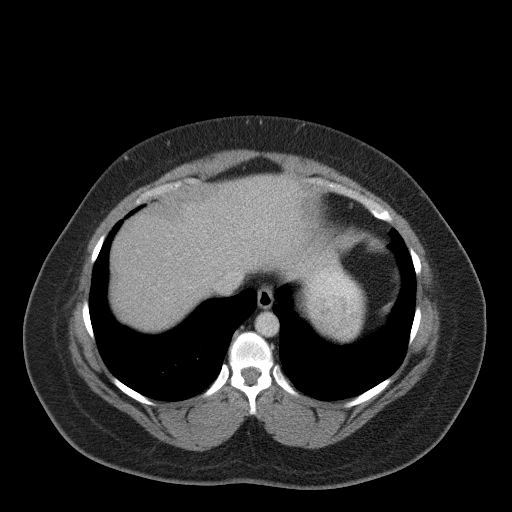

[Series 5: abd/pelvis 3.0 coronal · coronal · 0.91mm/px · 3 of 115 slices shown]
[im 39/115  soft-tissue]
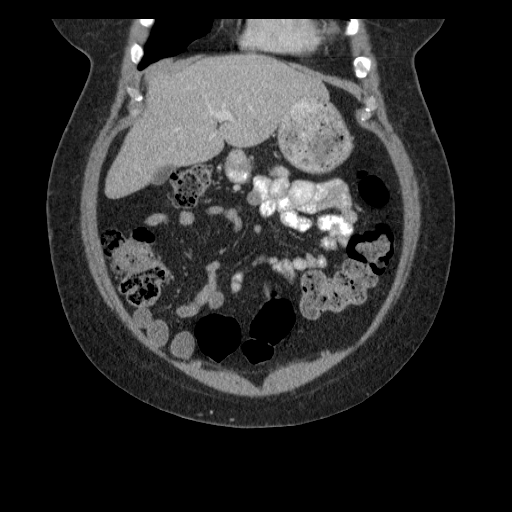
[im 51/115  soft-tissue]
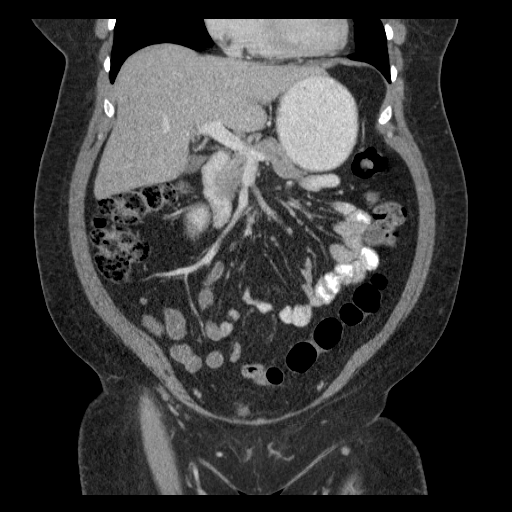
[im 64/115  soft-tissue]
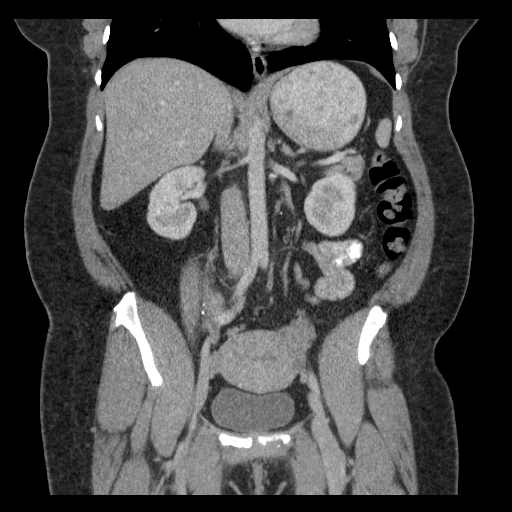

[17 of 46 positions shown; findings below may reference images not displayed]

FINDINGS: Lung bases are clear without focal nodule, mass, or airspace
disease. The heart size is normal. No significant pleural or
pericardial effusion is present.

The liver and spleen are within normal limits. The stomach,
duodenum, and pancreas are unremarkable. The common bile duct and
gallbladder are within normal limits. The adrenal glands are normal
bilaterally. The kidneys ureters are within normal limits. Urinary
bladder is unremarkable.

The rectosigmoid colon is within normal limits. Remainder the colon
is unremarkable. The appendix is visualized and normal. Small bowel
is within normal limits. There is no significant adenopathy or free
fluid.

The uterus and adnexa are within normal limits for age.

The bone windows are unremarkable.
IMPRESSION: 1. No acute or focal lesion to explain the patient's symptoms.
2. No significant stool burden or a gaseous distention is evident.

## 2017-05-22 IMAGING — DX DG ABDOMEN ACUTE W/ 1V CHEST
4 series · 4 of 4 positions shown · non-contrast
Comparison: None.

CLINICAL DATA: Flu wheel pre skull bloating, constipation

EXAM:
DG ABDOMEN ACUTE W/ 1V CHEST

[chest pa]
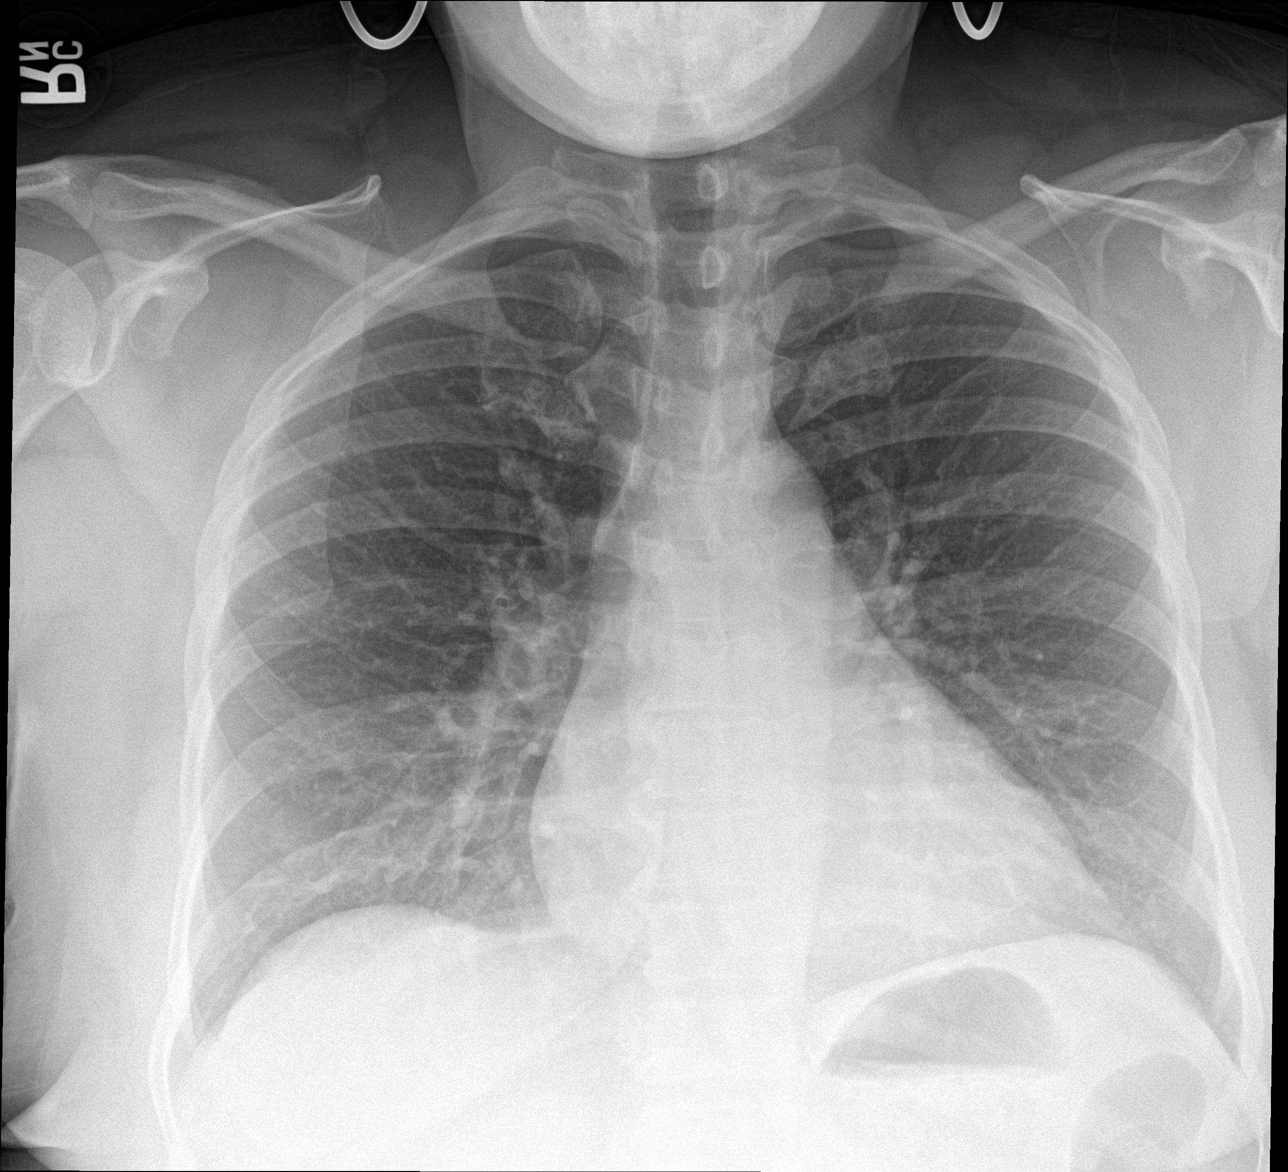

[abdomen erect]
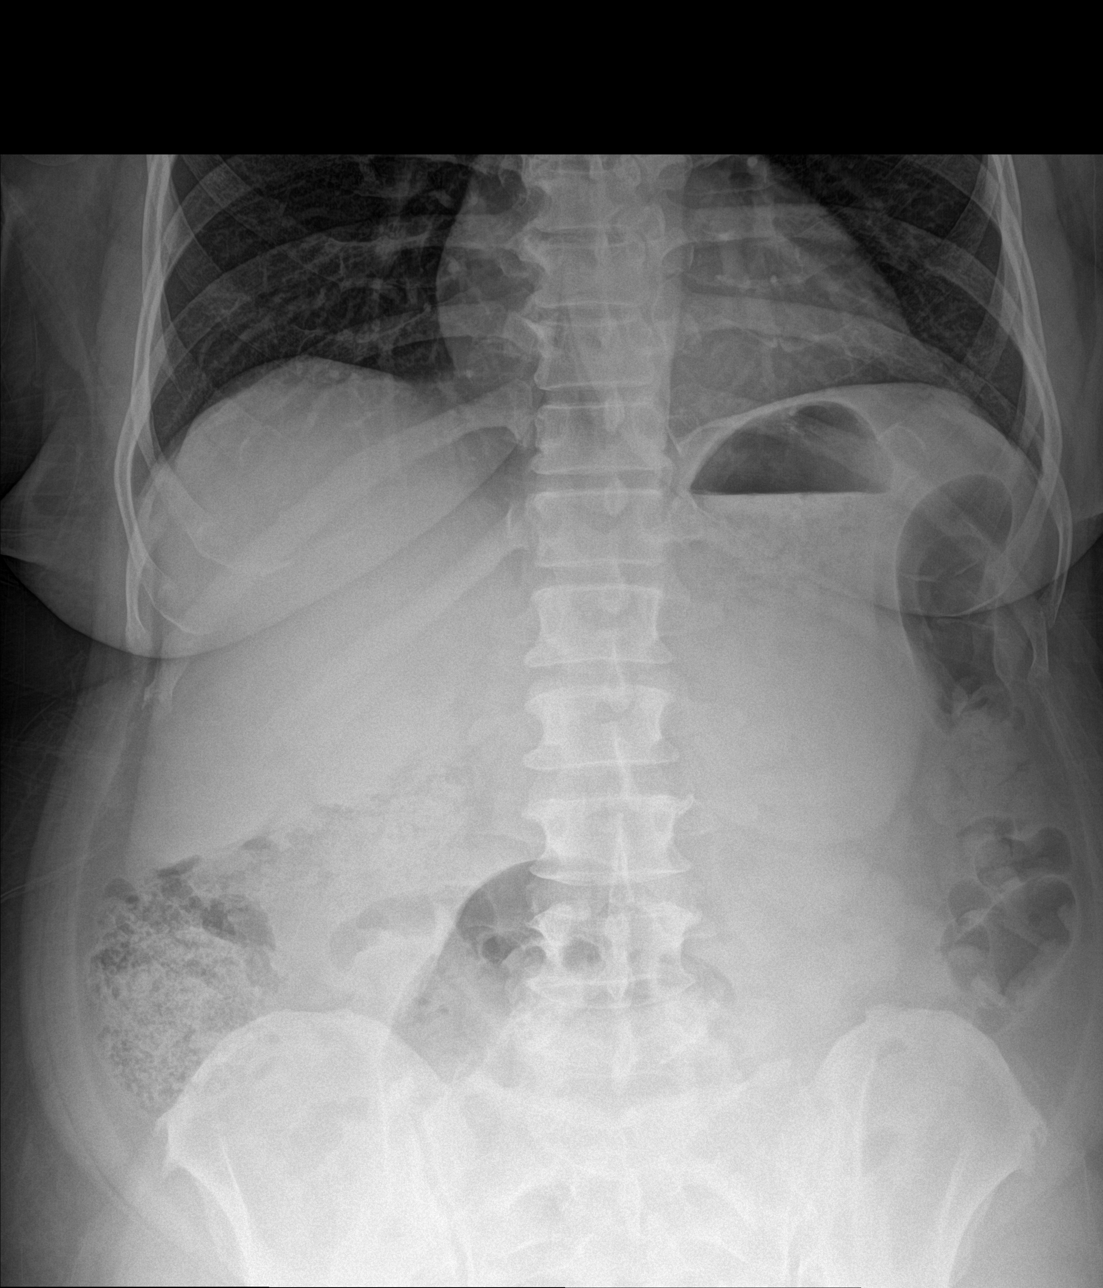

[abdomen supine (1 of 2)]
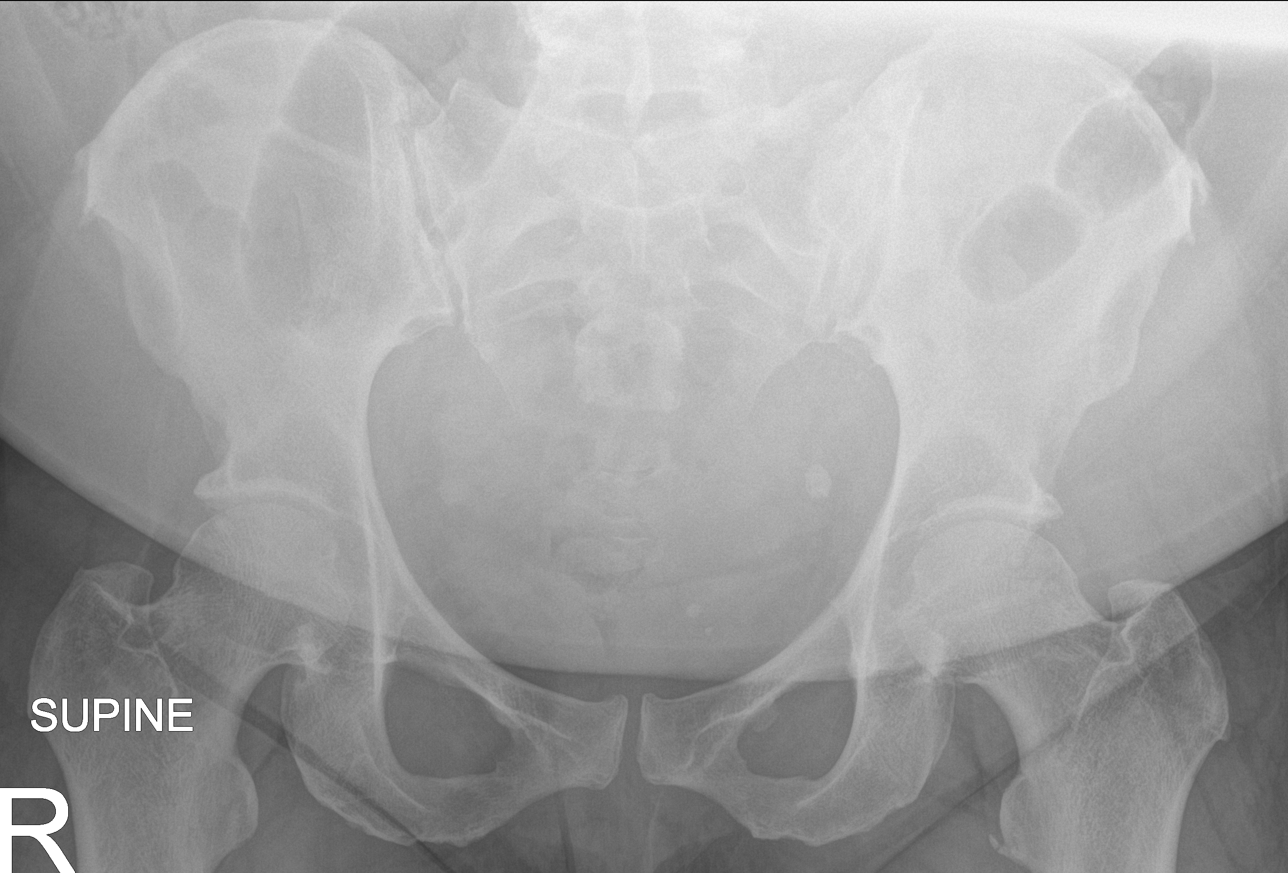

[abdomen supine (2 of 2)]
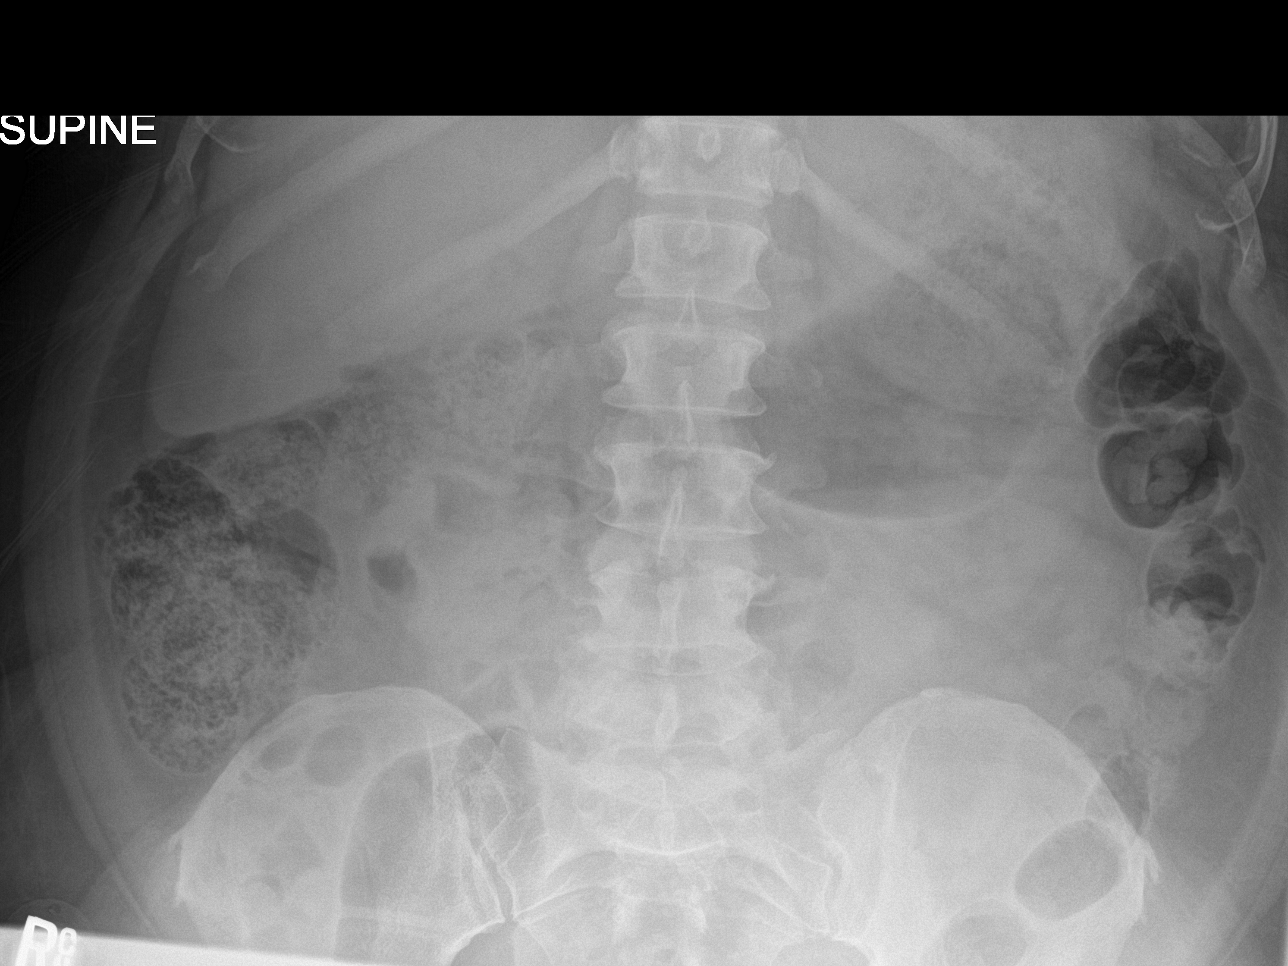

[4 of 4 positions shown; findings below may reference images not displayed]

FINDINGS: There is no evidence of dilated bowel loops or free intraperitoneal
air. No radiopaque calculi or other significant radiographic
abnormality is seen. Heart size and mediastinal contours are within
normal limits. Both lungs are clear. Moderate stool noted in right
colon. Some stool noted in descending colon.
IMPRESSION: Negative abdominal radiographs. No acute cardiopulmonary disease.
Moderate stool noted in right colon.

## 2017-05-23 ENCOUNTER — Encounter: Payer: Self-pay | Admitting: Cardiology

## 2017-05-23 ENCOUNTER — Ambulatory Visit (INDEPENDENT_AMBULATORY_CARE_PROVIDER_SITE_OTHER): Payer: 59 | Admitting: Cardiology

## 2017-05-23 VITALS — BP 138/66 | HR 84 | Ht 64.0 in | Wt 233.0 lb

## 2017-05-23 DIAGNOSIS — I10 Essential (primary) hypertension: Secondary | ICD-10-CM

## 2017-05-23 DIAGNOSIS — R072 Precordial pain: Secondary | ICD-10-CM | POA: Diagnosis not present

## 2017-05-23 DIAGNOSIS — R0600 Dyspnea, unspecified: Secondary | ICD-10-CM

## 2017-05-23 NOTE — Patient Instructions (Signed)
Medication Instructions:   NO CHANGE  Testing/Procedures:  Your physician has requested that you have an echocardiogram. Echocardiography is a painless test that uses sound waves to create images of your heart. It provides your doctor with information about the size and shape of your heart and how well your heart's chambers and valves are working. This procedure takes approximately one hour. There are no restrictions for this procedure.   Your physician has requested that you have an exercise tolerance test. For further information please visit [email or web address]. Please also follow instruction sheet, as given.    Follow-Up:  Your physician recommends that you schedule a follow-up appointment in: AS NEEDED PENDING TEST RESULTS    Exercise Stress Electrocardiogram An exercise stress electrocardiogram is a test to check how blood flows to your heart. It is done to find areas of poor blood flow. You will need to walk on a treadmill for this test. The electrocardiogram will record your heartbeat when you are at rest and when you are exercising. What happens before the procedure?  Do not have drinks with caffeine or foods with caffeine for 24 hours before the test, or as told by your doctor. This includes coffee, tea (even decaf tea), sodas, chocolate, and cocoa.  Follow your doctor's instructions about eating and drinking before the test.  Ask your doctor what medicines you should or should not take before the test. Take your medicines with water unless told by your doctor not to.  If you use an inhaler, bring it with you to the test.  Bring a snack to eat after the test.  Do not  smoke for 4 hours before the test.  Do not put lotions, powders, creams, or oils on your chest before the test.  Wear comfortable shoes and clothing. What happens during the procedure?  You will have patches put on your chest. Small areas of your chest may need to be shaved. Wires will be connected to the  patches.  Your heart rate will be watched while you are resting and while you are exercising.  You will walk on the treadmill. The treadmill will slowly get faster to raise your heart rate.  The test will take about 1-2 hours. What happens after the procedure?  Your heart rate and blood pressure will be watched after the test.  You may return to your normal diet, activities, and medicines or as told by your doctor. This information is not intended to replace advice given to you by your health care provider. Make sure you discuss any questions you have with your health care provider. Document Released: 04/09/2008 Document Revised: 06/20/2016 Document Reviewed: 06/29/2013 Elsevier Interactive Patient Education  [employer].

## 2017-05-27 ENCOUNTER — Encounter: Payer: Self-pay | Admitting: Family Medicine

## 2017-06-03 ENCOUNTER — Other Ambulatory Visit: Payer: Self-pay

## 2017-06-03 ENCOUNTER — Ambulatory Visit (HOSPITAL_COMMUNITY): Payer: 59 | Attending: Internal Medicine

## 2017-06-03 DIAGNOSIS — E669 Obesity, unspecified: Secondary | ICD-10-CM | POA: Diagnosis not present

## 2017-06-03 DIAGNOSIS — R072 Precordial pain: Secondary | ICD-10-CM | POA: Diagnosis present

## 2017-06-03 DIAGNOSIS — Z6841 Body Mass Index (BMI) 40.0 and over, adult: Secondary | ICD-10-CM | POA: Diagnosis not present

## 2017-06-03 DIAGNOSIS — I10 Essential (primary) hypertension: Secondary | ICD-10-CM | POA: Diagnosis not present

## 2017-06-04 ENCOUNTER — Telehealth (HOSPITAL_COMMUNITY): Payer: Self-pay

## 2017-06-04 NOTE — Telephone Encounter (Signed)
Close encounter 

## 2017-06-06 ENCOUNTER — Ambulatory Visit (HOSPITAL_COMMUNITY)
Admission: RE | Admit: 2017-06-06 | Discharge: 2017-06-06 | Disposition: A | Discharge status: Home / Self Care | Payer: 59 | Source: Ambulatory Visit | Attending: Cardiology | Admitting: Cardiology

## 2017-06-06 DIAGNOSIS — R072 Precordial pain: Secondary | ICD-10-CM

## 2017-06-18 ENCOUNTER — Telehealth: Payer: Self-pay | Admitting: Family Medicine

## 2017-06-18 DIAGNOSIS — G43009 Migraine without aura, not intractable, without status migrainosus: Secondary | ICD-10-CM

## 2017-06-18 MED ORDER — BUTALBITAL-APAP-CAFFEINE 50-325-40 MG PO TABS: 1.0000 | ORAL_TABLET | Freq: Four times a day (QID) | ORAL | 1 refills | Status: DC | PRN

## 2017-06-18 NOTE — Telephone Encounter (Signed)
Pt is requesting refill on Fioricet. Please advise.  

## 2017-06-21 ENCOUNTER — Encounter: Payer: Self-pay | Admitting: Family Medicine

## 2017-06-21 DIAGNOSIS — N760 Acute vaginitis: Principal | ICD-10-CM

## 2017-06-21 DIAGNOSIS — B9689 Other specified bacterial agents as the cause of diseases classified elsewhere: Secondary | ICD-10-CM

## 2017-06-21 MED ORDER — METRONIDAZOLE 500 MG PO TABS: 500.0000 mg | ORAL_TABLET | Freq: Two times a day (BID) | ORAL | 0 refills | Status: DC

## 2017-07-10 ENCOUNTER — Telehealth (HOSPITAL_COMMUNITY): Payer: Self-pay

## 2017-07-10 NOTE — Telephone Encounter (Signed)
Encounter complete. 

## 2017-07-12 ENCOUNTER — Ambulatory Visit (HOSPITAL_COMMUNITY)
Admission: RE | Admit: 2017-07-12 | Discharge: 2017-07-12 | Disposition: A | Discharge status: Home / Self Care | Payer: 59 | Source: Ambulatory Visit | Attending: Cardiovascular Disease | Admitting: Cardiovascular Disease

## 2017-07-12 DIAGNOSIS — R072 Precordial pain: Secondary | ICD-10-CM | POA: Insufficient documentation

## 2017-07-12 LAB — EXERCISE TOLERANCE TEST
CHL CUP RESTING HR STRESS: 78 {beats}/min
CHL RATE OF PERCEIVED EXERTION: 17
CSEPEDS: 38 s
CSEPEW: 7 METS
CSEPHR: 89 %
CSEPPHR: 150 {beats}/min
Exercise duration (min): 5 min
MPHR: 167 {beats}/min

## 2017-07-15 ENCOUNTER — Encounter: Payer: Self-pay | Admitting: Family Medicine

## 2017-07-15 MED ORDER — PHENTERMINE HCL 15 MG PO CAPS
15.0000 mg | ORAL_CAPSULE | ORAL | 1 refills | Status: DC
Start: 2017-07-15 — End: 2019-08-05

## 2017-07-15 MED ORDER — RIZATRIPTAN BENZOATE 10 MG PO TABS: 10.0000 mg | ORAL_TABLET | ORAL | 0 refills | Status: DC | PRN

## 2017-07-15 NOTE — Telephone Encounter (Signed)
BP Readings from Last 3 Encounters:  05/23/17 138/66  04/29/17 137/60  03/28/17 138/67    Meds ordered this encounter  Medications  . rizatriptan (MAXALT) 10 MG tablet    Sig: Take 1 tablet (10 mg total) by mouth as needed for migraine. May repeat in 2 hours if needed    Dispense:  10 tablet    Refill:  0  . phentermine 15 MG capsule    Sig: Take 1 capsule (15 mg total) by mouth every morning.    Dispense:  30 capsule    Refill:  1

## 2017-08-01 ENCOUNTER — Other Ambulatory Visit: Payer: Self-pay | Admitting: Obstetrics & Gynecology

## 2017-08-01 ENCOUNTER — Encounter: Payer: Self-pay | Admitting: Obstetrics & Gynecology

## 2017-08-01 ENCOUNTER — Ambulatory Visit (INDEPENDENT_AMBULATORY_CARE_PROVIDER_SITE_OTHER): Payer: 59 | Admitting: Obstetrics & Gynecology

## 2017-08-01 VITALS — BP 145/82 | HR 85 | Ht 64.0 in | Wt 227.0 lb

## 2017-08-01 DIAGNOSIS — Z1231 Encounter for screening mammogram for malignant neoplasm of breast: Secondary | ICD-10-CM

## 2017-08-01 DIAGNOSIS — Z01419 Encounter for gynecological examination (general) (routine) without abnormal findings: Secondary | ICD-10-CM

## 2017-08-01 DIAGNOSIS — K5900 Constipation, unspecified: Secondary | ICD-10-CM

## 2017-08-01 DIAGNOSIS — Z1239 Encounter for other screening for malignant neoplasm of breast: Secondary | ICD-10-CM

## 2017-08-01 MED ORDER — LINACLOTIDE 72 MCG PO CAPS: 72.0000 ug | ORAL_CAPSULE | Freq: Every day | ORAL | 6 refills | Status: DC

## 2017-08-01 NOTE — Progress Notes (Signed)
Patient is here today for Routine GYN exam.

## 2017-08-01 NOTE — Progress Notes (Signed)
Subjective:     Lori Green is a 51 y.o. female here for a routine exam.  Current complaints: Pt with constipation. She prev tried colace and Maritzaand other OTC meds with no relief of sx. Pt was prev on Linzess with no relief but, pt ran out of sx .     Gynecologic History Patient's last menstrual period was 06/10/2015. Contraception: status post hysterectomy Last Pap: 09/2012. Results were: normal Last mammogram: 03/2003. Results were: normal  Obstetric History OB History  Gravida Para Term Preterm AB Living  6 6 6     6   SAB TAB Ectopic Multiple Live Births               # Outcome Date GA Lbr Len/2nd Weight Sex Delivery Anes PTL Lv  6 Term      Vag-Spont     5 Term      Vag-Spont     4 Term      Vag-Spont     3 Term      Vag-Spont     2 Term      Vag-Spont     1 Term      Vag-Spont        The following portions of the patient's history were reviewed and updated as appropriate: allergies, current medications, past family history, past medical history, past social history, past surgical history and problem list.  Review of Systems Pertinent items are noted in HPI.    Objective:  BP (!) 145/82   Pulse 85   Ht 5\' 4"  (1.626 m)   Wt 227 lb (103 kg)   LMP 06/10/2015   BMI 38.96 kg/m  General Appearance:    Alert, cooperative, no distress, appears stated age  Head:    Normocephalic, without obvious abnormality, atraumatic  Eyes:    conjunctiva/corneas clear, EOM's intact, both eyes  Ears:    Normal external ear canals, both ears  Nose:   Nares normal, septum midline, mucosa normal, no drainage    or sinus tenderness  Throat:   Lips, mucosa, and tongue normal; teeth and gums normal  Neck:   Supple, symmetrical, trachea midline, no adenopathy;    thyroid:  no enlargement/tenderness/nodules  Back:     Symmetric, no curvature, ROM normal, no CVA tenderness  Lungs:     Clear to auscultation bilaterally, respirations unlabored  Chest Wall:    No tenderness or deformity   Heart:    Regular rate and rhythm, S1 and S2 normal, no murmur, rub   or gallop  Breast Exam:    No tenderness, masses, or nipple abnormality  Abdomen:     Soft, non-tender, bowel sounds active all four quadrants,    no masses, no organomegaly  Genitalia:    Normal female without lesion, discharge or tenderness uterus and cervix surgically absent     Extremities:   Extremities normal, atraumatic, no cyanosis or edema  Pulses:   2+ and symmetric all extremities  Skin:   Skin color, texture, turgor normal, no rashes or lesions      Assessment:    Healthy female exam.   Constipation- failed OTC laxatives and Miralax Breast cnacer screening- reviewed with pt the importance of screening for breast cnacer   Plan:  Linzess 77mcg po q day  Mammogram ordered. Follow up in: 1 year.     [name] L. Harraway-Smith, M.D., [name]

## 2017-08-12 ENCOUNTER — Telehealth: Payer: Self-pay | Admitting: *Deleted

## 2017-08-12 ENCOUNTER — Ambulatory Visit
Admission: RE | Admit: 2017-08-12 | Discharge: 2017-08-12 | Disposition: A | Discharge status: Home / Self Care | Payer: 59 | Source: Ambulatory Visit | Attending: Obstetrics & Gynecology | Admitting: Obstetrics & Gynecology

## 2017-08-12 DIAGNOSIS — Z1231 Encounter for screening mammogram for malignant neoplasm of breast: Secondary | ICD-10-CM

## 2017-08-12 NOTE — Telephone Encounter (Signed)
Patient left message on nurse voicemail on 08/09/17 at 1523.  States she was last seen by Dr. Ihor Dow on 08/02/17.  States she prescribed Linzese for constipation.  States she hasn't been able to get the medication because her insurance requires a preauthorization.  Requests we call [facility] [phone] and ask for preauthorization department.  Requests this be taken care of quickly as she is still having difficulties from constipation.

## 2017-08-13 NOTE — Telephone Encounter (Signed)
Called patient's insurance & obtained prior auth. Called & informed patient. Patient verbalized understanding & had no questions

## 2017-08-14 ENCOUNTER — Encounter: Payer: Self-pay | Admitting: Family Medicine

## 2017-08-14 DIAGNOSIS — G478 Other sleep disorders: Secondary | ICD-10-CM

## 2017-08-14 DIAGNOSIS — R0683 Snoring: Secondary | ICD-10-CM

## 2017-08-15 ENCOUNTER — Other Ambulatory Visit: Payer: Self-pay | Admitting: Obstetrics & Gynecology

## 2017-08-15 DIAGNOSIS — R928 Other abnormal and inconclusive findings on diagnostic imaging of breast: Secondary | ICD-10-CM

## 2017-08-20 ENCOUNTER — Other Ambulatory Visit: Payer: 59

## 2017-08-22 ENCOUNTER — Ambulatory Visit
Admission: RE | Admit: 2017-08-22 | Discharge: 2017-08-22 | Disposition: A | Discharge status: Home / Self Care | Payer: 59 | Source: Ambulatory Visit | Attending: Obstetrics & Gynecology | Admitting: Obstetrics & Gynecology

## 2017-08-22 DIAGNOSIS — R928 Other abnormal and inconclusive findings on diagnostic imaging of breast: Secondary | ICD-10-CM

## 2017-09-27 ENCOUNTER — Encounter: Payer: Self-pay | Admitting: Family Medicine

## 2017-10-22 ENCOUNTER — Observation Stay (HOSPITAL_BASED_OUTPATIENT_CLINIC_OR_DEPARTMENT_OTHER)
Admission: EM | Admit: 2017-10-22 | Discharge: 2017-10-24 | Disposition: A | Discharge status: Home / Self Care | Payer: 59 | Attending: Family Medicine | Admitting: Family Medicine

## 2017-10-22 ENCOUNTER — Encounter (HOSPITAL_BASED_OUTPATIENT_CLINIC_OR_DEPARTMENT_OTHER): Payer: Self-pay | Admitting: *Deleted

## 2017-10-22 ENCOUNTER — Emergency Department (HOSPITAL_BASED_OUTPATIENT_CLINIC_OR_DEPARTMENT_OTHER): Payer: 59

## 2017-10-22 ENCOUNTER — Other Ambulatory Visit: Payer: Self-pay

## 2017-10-22 DIAGNOSIS — Z79899 Other long term (current) drug therapy: Secondary | ICD-10-CM | POA: Insufficient documentation

## 2017-10-22 DIAGNOSIS — R739 Hyperglycemia, unspecified: Secondary | ICD-10-CM | POA: Diagnosis not present

## 2017-10-22 DIAGNOSIS — R0603 Acute respiratory distress: Secondary | ICD-10-CM | POA: Diagnosis not present

## 2017-10-22 DIAGNOSIS — Z23 Encounter for immunization: Secondary | ICD-10-CM | POA: Insufficient documentation

## 2017-10-22 DIAGNOSIS — I10 Essential (primary) hypertension: Secondary | ICD-10-CM | POA: Diagnosis not present

## 2017-10-22 DIAGNOSIS — J45901 Unspecified asthma with (acute) exacerbation: Principal | ICD-10-CM | POA: Insufficient documentation

## 2017-10-22 DIAGNOSIS — T380X5A Adverse effect of glucocorticoids and synthetic analogues, initial encounter: Secondary | ICD-10-CM | POA: Insufficient documentation

## 2017-10-22 DIAGNOSIS — R000 Tachycardia, unspecified: Secondary | ICD-10-CM | POA: Diagnosis not present

## 2017-10-22 DIAGNOSIS — R0602 Shortness of breath: Secondary | ICD-10-CM

## 2017-10-22 DIAGNOSIS — B974 Respiratory syncytial virus as the cause of diseases classified elsewhere: Secondary | ICD-10-CM | POA: Diagnosis not present

## 2017-10-22 DIAGNOSIS — K589 Irritable bowel syndrome without diarrhea: Secondary | ICD-10-CM | POA: Insufficient documentation

## 2017-10-22 HISTORY — DX: Unspecified asthma with (acute) exacerbation: J45.901

## 2017-10-22 LAB — CBC WITH DIFFERENTIAL/PLATELET
BASOS PCT: 0 %
Basophils Absolute: 0 10*3/uL (ref 0.0–0.1)
EOS ABS: 0.1 10*3/uL (ref 0.0–0.7)
EOS PCT: 2 %
HCT: 36.9 % (ref 36.0–46.0)
Hemoglobin: 12.2 g/dL (ref 12.0–15.0)
Lymphocytes Relative: 69 %
Lymphs Abs: 4.1 10*3/uL — ABNORMAL HIGH (ref 0.7–4.0)
MCH: 28.2 pg (ref 26.0–34.0)
MCHC: 33.1 g/dL (ref 30.0–36.0)
MCV: 85.2 fL (ref 78.0–100.0)
MONO ABS: 0.6 10*3/uL (ref 0.1–1.0)
MONOS PCT: 11 %
NEUTROS PCT: 18 %
Neutro Abs: 1 10*3/uL — ABNORMAL LOW (ref 1.7–7.7)
PLATELETS: 268 10*3/uL (ref 150–400)
RBC: 4.33 MIL/uL (ref 3.87–5.11)
RDW: 13.9 % (ref 11.5–15.5)
WBC: 5.8 10*3/uL (ref 4.0–10.5)

## 2017-10-22 LAB — BASIC METABOLIC PANEL
Anion gap: 6 (ref 5–15)
BUN: 10 mg/dL (ref 6–20)
CALCIUM: 9.1 mg/dL (ref 8.9–10.3)
CO2: 28 mmol/L (ref 22–32)
CREATININE: 0.8 mg/dL (ref 0.44–1.00)
Chloride: 101 mmol/L (ref 101–111)
GFR calc non Af Amer: >60 mL/min (ref >60)
GLUCOSE: 106 mg/dL — AB (ref 65–99)
Potassium: 3.9 mmol/L (ref 3.5–5.1)
Sodium: 135 mmol/L (ref 135–145)

## 2017-10-22 LAB — TROPONIN I

## 2017-10-22 MED ORDER — ALBUTEROL SULFATE HFA 108 (90 BASE) MCG/ACT IN AERS
2.0000 | INHALATION_SPRAY | Freq: Once | RESPIRATORY_TRACT | Status: AC
  Administered 2017-10-22: 2 via RESPIRATORY_TRACT
  Filled 2017-10-22: qty 6.7

## 2017-10-22 MED ORDER — ALBUTEROL (5 MG/ML) CONTINUOUS INHALATION SOLN
15.0000 mg/h | INHALATION_SOLUTION | Freq: Once | RESPIRATORY_TRACT | Status: AC
  Administered 2017-10-22: 15 mg/h via RESPIRATORY_TRACT

## 2017-10-22 MED ORDER — ALBUTEROL (5 MG/ML) CONTINUOUS INHALATION SOLN
15.0000 mg/h | INHALATION_SOLUTION | Freq: Once | RESPIRATORY_TRACT | Status: AC
  Administered 2017-10-22: 15 mg/h via RESPIRATORY_TRACT
  Filled 2017-10-22: qty 20

## 2017-10-22 MED ORDER — SODIUM CHLORIDE 0.9 % IV BOLUS (SEPSIS)
1000.0000 mL | Freq: Once | INTRAVENOUS | Status: AC
  Administered 2017-10-22: 1000 mL via INTRAVENOUS

## 2017-10-22 MED ORDER — IPRATROPIUM-ALBUTEROL 0.5-2.5 (3) MG/3ML IN SOLN
3.0000 mL | Freq: Once | RESPIRATORY_TRACT | Status: AC
  Administered 2017-10-22: 3 mL via RESPIRATORY_TRACT

## 2017-10-22 MED ORDER — MAGNESIUM SULFATE 2 GM/50ML IV SOLN
2.0000 g | Freq: Once | INTRAVENOUS | Status: AC
  Administered 2017-10-22: 2 g via INTRAVENOUS
  Filled 2017-10-22: qty 50

## 2017-10-22 MED ORDER — SODIUM CHLORIDE 0.9 % IV BOLUS (SEPSIS)
500.0000 mL | Freq: Once | INTRAVENOUS | Status: AC
  Administered 2017-10-22: 500 mL via INTRAVENOUS

## 2017-10-22 MED ORDER — METHYLPREDNISOLONE SODIUM SUCC 125 MG IJ SOLR
125.0000 mg | Freq: Once | INTRAMUSCULAR | Status: AC
  Administered 2017-10-22: 125 mg via INTRAVENOUS
  Filled 2017-10-22: qty 2

## 2017-10-22 NOTE — ED Provider Notes (Signed)
Emergency Department Provider Note   I have reviewed the triage vital signs and the nursing notes.   HISTORY  Chief Complaint Shortness of Breath   HPI Lori Green is a 51 y.o. female with PMH of HTN presents to the emergency department for evaluation of coughing with wheezing over the past 4 days.  She is also experiencing some pain on the right posterior chest is worse with turning or pressing on the area.  No fevers or chills.  Her cough is slightly productive.  No vomiting or diarrhea.  No sick contacts.  Patient states she has had episodes of wheezing in the past with bronchitis but has no history of asthma or COPD.  She does not smoke cigarettes.  Denies any syncope.   Past Medical History:  Diagnosis Date  . Asthma exacerbation 10/22/2017  . Bartholin gland cyst   . Fluid excess   . Hypertension   . Menorrhagia   . SVD (spontaneous vaginal delivery)    x 6    Patient Active Problem List   Diagnosis Date Noted  . Asthma exacerbation 10/22/2017  . Migraine without aura and without status migrainosus, not intractable 04/29/2017  . Hemorrhagic ovarian cyst 07/19/2015  . Postoperative state 07/19/2015  . Hypertension 09/23/2012  . Obesity 09/23/2012  . Dysfunctional uterine bleeding 09/12/2012    Past Surgical History:  Procedure Laterality Date  . BILATERAL SALPINGECTOMY Bilateral 07/19/2015   Procedure: BILATERAL SALPINGECTOMY;  Surgeon: Lavonia Drafts, MD;  Location: [facility];  Service: Gynecology;  Laterality: Bilateral;  . BREAST REDUCTION SURGERY  04/2003  . BUNIONECTOMY  06/17/12   lt  . CARPAL TUNNEL RELEASE Left 06/25/2014   Procedure: LEFT CARPAL TUNNEL RELEASE;  Surgeon: Daryll Brod, MD;  Location: [place];  Service: Orthopedics;  Laterality: Left;  . DILATATION & CURETTAGE/HYSTEROSCOPY WITH MYOSURE N/A 05/10/2015   Procedure: DILATATION & CURETTAGE/HYSTEROSCOPY WITH MYOSURE;  Surgeon: Lavonia Drafts, MD;  Location: [facility];   Service: Gynecology;  Laterality: N/A;  . HYSTEROSCOPY WITH NOVASURE N/A 03/01/2014   Procedure: HYSTEROSCOPY WITH NOVASURE;  Surgeon: Lavonia Drafts, MD;  Location: [facility];  Service: Gynecology;  Laterality: N/A;  . OOPHORECTOMY Right 07/19/2015   Procedure: PARTIAL OOPHORECTOMY;  Surgeon: Lavonia Drafts, MD;  Location: [facility];  Service: Gynecology;  Laterality: Right;  . REDUCTION MAMMAPLASTY    . VAGINAL HYSTERECTOMY N/A 07/19/2015   Procedure: HYSTERECTOMY VAGINAL;  Surgeon: Lavonia Drafts, MD;  Location: [facility];  Service: Gynecology;  Laterality: N/A;  . WISDOM TOOTH EXTRACTION      Current Outpatient Rx  . Order #: [id number] Class: Historical Med  . Order #: [id number] Class: Normal  . Order #: [id number] Class: Historical Med  . Order #: [id number] Class: Phone In  . Order #: [id number] Class: Normal  . Order #: [id number] Class: Phone In  . Order #: [id number] Class: Normal    Allergies Patient has no known allergies.  Family History  Problem Relation Age of Onset  . Heart disease Father   . Stroke Father   . Diabetes Son     Social History Social History   Tobacco Use  . Smoking status: Never Smoker  . Smokeless tobacco: Never Used  Substance Use Topics  . Alcohol use: No  . Drug use: No    Review of Systems  Constitutional: No fever/chills Eyes: No visual changes. ENT: No sore throat. Cardiovascular: Positive chest pain. Respiratory: Positive shortness of breath and wheezing.  Gastrointestinal: No abdominal pain.  No nausea, no vomiting.  No diarrhea.  No constipation. Genitourinary: Negative for dysuria. Musculoskeletal: Negative for back pain. Skin: Negative for rash. Neurological: Negative for headaches, focal weakness or numbness.  10-point ROS otherwise negative.  ____________________________________________   PHYSICAL EXAM:  VITAL SIGNS: ED Triage Vitals  Enc Vitals Group     BP 10/22/17 0948 (!) 139/92     Pulse Rate 10/22/17  0948 93     Resp 10/22/17 0948 20     Temp 10/22/17 0948 98.4 F (36.9 C)     Temp Source 10/22/17 0948 Oral     SpO2 10/22/17 0948 100 %     Pain Score 10/22/17 0956 6    Constitutional: Alert and oriented. Appears to be in moderate respiratory distress with wheezing.  Eyes: Conjunctivae are normal.  Head: Atraumatic. Nose: No congestion/rhinnorhea. Mouth/Throat: Mucous membranes are moist. Neck: No stridor.  Cardiovascular: Normal rate, regular rhythm. Good peripheral circulation. Grossly normal heart sounds.   Respiratory: Increased respiratory effort.  No retractions. Lungs with diffuse end-expiratory wheezing bilaterally. Symmetrical exam.  Gastrointestinal: Soft and nontender. No distention.  Musculoskeletal: No lower extremity tenderness nor edema. No gross deformities of extremities. Neurologic:  Normal speech and language. No gross focal neurologic deficits are appreciated.  Skin:  Skin is warm, dry and intact. No rash noted.  ____________________________________________   LABS (all labs ordered are listed, but only abnormal results are displayed)  Labs Reviewed  BASIC METABOLIC PANEL - Abnormal; Notable for the following components:      Result Value   Glucose, Bld 106 (*)    All other components within normal limits  CBC WITH DIFFERENTIAL/PLATELET - Abnormal; Notable for the following components:   Neutro Abs 1.0 (*)    Lymphs Abs 4.1 (*)    All other components within normal limits  TROPONIN I   ____________________________________________  EKG   EKG Interpretation  Date/Time:  Tuesday October 22 2017 10:14:40 EST Ventricular Rate:  88 PR Interval:    QRS Duration: 89 QT Interval:  371 QTC Calculation: 449 R Axis:   35 Text Interpretation:  Sinus rhythm No STEMI  Confirmed by Nanda Quinton [phone]) on 10/22/2017 10:24:53 AM       ____________________________________________  RADIOLOGY  Dg Chest 2 View  Result Date: 10/22/2017 CLINICAL DATA:   Wheezing, back pain, shortness of breath EXAM: CHEST  2 VIEW COMPARISON:  03/28/2017, 12/22/2016 FINDINGS: The heart size and mediastinal contours are within normal limits. Both lungs are clear. The visualized skeletal structures are unremarkable. IMPRESSION: No active cardiopulmonary disease. Electronically Signed   By: Kathreen Devoid   On: 10/22/2017 12:01    ____________________________________________   PROCEDURES  Procedure(s) performed:   Procedures  CRITICAL CARE Performed by: Margette Fast Total critical care time: 50 minutes Critical care time was exclusive of separately billable procedures and treating other patients. Critical care was necessary to treat or prevent imminent or life-threatening deterioration. Critical care was time spent personally by me on the following activities: development of treatment plan with patient and/or surrogate as well as nursing, discussions with consultants, evaluation of patient's response to treatment, examination of patient, obtaining history from patient or surrogate, ordering and performing treatments and interventions, ordering and review of laboratory studies, ordering and review of radiographic studies, pulse oximetry and re-evaluation of patient's condition.  Nanda Quinton, MD Emergency Medicine  ____________________________________________   INITIAL IMPRESSION / ASSESSMENT AND PLAN / ED COURSE  Pertinent labs & imaging results that were available during my care of the patient were reviewed by me and  considered in my medical decision making (see chart for details).  Patient presents to the emergency department for evaluation of coughing and wheezing starting 4 days ago but worsening.  Patient with no history of asthma or COPD.  She is non-smoker.  She has significant wheezing on exam and is in some respiratory distress.  Improved slightly after DuoNeb but have low threshold to transition to continuous albuterol.  Chest x-ray and labs  pending.  No evidence to suggest PE or ACS but will obtain troponin.   12:33 PM Patient feeling better after continuous neb.  Plan for additional albuterol inhaler and monitoring prior to likely discharge.  01:20 PM Patient placed back on CAT. Not as bad as before but exam with continued wheezing and increased WOB. No BiPAP or other airway support at this time. Plan for admission.   Discussed patient's case with Hospitalist, Dr. Renne Crigler to request admission. Patient and family (if present) updated with plan. Care transferred to Hospitalist service.  I reviewed all nursing notes, vitals, pertinent old records, EKGs, labs, imaging (as available).  ___________________________________________  FINAL CLINICAL IMPRESSION(S) / ED DIAGNOSES  Final diagnoses:  SOB (shortness of breath)     MEDICATIONS GIVEN DURING THIS VISIT:  Medications  ipratropium-albuterol (DUONEB) 0.5-2.5 (3) MG/3ML nebulizer solution 3 mL (3 mLs Nebulization Given 10/22/17 1000)  sodium chloride 0.9 % bolus 500 mL (0 mLs Intravenous Stopped 10/22/17 1134)  albuterol (PROVENTIL,VENTOLIN) solution continuous neb (15 mg/hr Nebulization Given 10/22/17 1016)  methylPREDNISolone sodium succinate (SOLU-MEDROL) 125 mg/2 mL injection 125 mg (125 mg Intravenous Given 10/22/17 1021)  albuterol (PROVENTIL HFA;VENTOLIN HFA) 108 (90 Base) MCG/ACT inhaler 2 puff (2 puffs Inhalation Given 10/22/17 1228)  albuterol (PROVENTIL,VENTOLIN) solution continuous neb (15 mg/hr Nebulization Given 10/22/17 1318)  sodium chloride 0.9 % bolus 1,000 mL (1,000 mLs Intravenous New Bag/Given 10/22/17 1329)     Note:  This document was prepared using Dragon voice recognition software and may include unintentional dictation errors.  Nanda Quinton, MD Emergency Medicine    Young Mulvey, [facility], MD 10/22/17 [id number]

## 2017-10-22 NOTE — ED Notes (Signed)
Pts hour long neb was stopped at pts request. Pt was feeling a pain in the bottom of her throat. Pt was placed on a heart monitor.

## 2017-10-22 NOTE — ED Notes (Signed)
[facility] arrived to transport pt 

## 2017-10-22 NOTE — ED Triage Notes (Signed)
Pt has been coughing and wheezing since Saturday and pain in back on right side.

## 2017-10-22 NOTE — ED Notes (Signed)
Pt speaking in complete sentences with no SOB noted. Pt in NAD at this time. Pt reports "feeling better overall"

## 2017-10-23 ENCOUNTER — Encounter (HOSPITAL_COMMUNITY): Payer: Self-pay | Admitting: Internal Medicine

## 2017-10-23 DIAGNOSIS — J4521 Mild intermittent asthma with (acute) exacerbation: Secondary | ICD-10-CM | POA: Diagnosis not present

## 2017-10-23 LAB — RESPIRATORY PANEL BY PCR
ADENOVIRUS-RVPPCR: NOT DETECTED
BORDETELLA PERTUSSIS-RVPCR: NOT DETECTED
CORONAVIRUS 229E-RVPPCR: NOT DETECTED
CORONAVIRUS HKU1-RVPPCR: NOT DETECTED
Chlamydophila pneumoniae: NOT DETECTED
Coronavirus NL63: NOT DETECTED
Coronavirus OC43: NOT DETECTED
Influenza A: NOT DETECTED
Influenza B: NOT DETECTED
METAPNEUMOVIRUS-RVPPCR: NOT DETECTED
Mycoplasma pneumoniae: NOT DETECTED
PARAINFLUENZA VIRUS 1-RVPPCR: NOT DETECTED
PARAINFLUENZA VIRUS 2-RVPPCR: NOT DETECTED
Parainfluenza Virus 3: NOT DETECTED
Parainfluenza Virus 4: NOT DETECTED
Respiratory Syncytial Virus: DETECTED — AB
Rhinovirus / Enterovirus: NOT DETECTED

## 2017-10-23 LAB — BASIC METABOLIC PANEL
Anion gap: 7 (ref 5–15)
BUN: 11 mg/dL (ref 6–20)
CALCIUM: 9.1 mg/dL (ref 8.9–10.3)
CO2: 24 mmol/L (ref 22–32)
CREATININE: 0.88 mg/dL (ref 0.44–1.00)
Chloride: 104 mmol/L (ref 101–111)
GFR calc Af Amer: >60 mL/min (ref >60)
Glucose, Bld: 254 mg/dL — ABNORMAL HIGH (ref 65–99)
Potassium: 4.7 mmol/L (ref 3.5–5.1)
SODIUM: 135 mmol/L (ref 135–145)

## 2017-10-23 LAB — CBC
HCT: 34.9 % — ABNORMAL LOW (ref 36.0–46.0)
Hemoglobin: 11.4 g/dL — ABNORMAL LOW (ref 12.0–15.0)
MCH: 28.1 pg (ref 26.0–34.0)
MCHC: 32.7 g/dL (ref 30.0–36.0)
MCV: 86.2 fL (ref 78.0–100.0)
PLATELETS: 259 10*3/uL (ref 150–400)
RBC: 4.05 MIL/uL (ref 3.87–5.11)
RDW: 14.3 % (ref 11.5–15.5)
WBC: 9.7 10*3/uL (ref 4.0–10.5)

## 2017-10-23 LAB — HIV ANTIBODY (ROUTINE TESTING W REFLEX): HIV SCREEN 4TH GENERATION: NONREACTIVE

## 2017-10-23 MED ORDER — HYDROCODONE-HOMATROPINE 5-1.5 MG/5ML PO SYRP
5.0000 mL | ORAL_SOLUTION | ORAL | Status: DC | PRN
  Administered 2017-10-23 – 2017-10-24 (×4): 5 mL via ORAL
  Filled 2017-10-23 (×4): qty 5

## 2017-10-23 MED ORDER — LINACLOTIDE 72 MCG PO CAPS
72.0000 ug | ORAL_CAPSULE | Freq: Every day | ORAL | Status: DC
  Administered 2017-10-23 – 2017-10-24 (×2): 72 ug via ORAL
  Filled 2017-10-23 (×2): qty 1

## 2017-10-23 MED ORDER — HYDRALAZINE HCL 20 MG/ML IJ SOLN: 10.0000 mg | INTRAMUSCULAR | Status: DC | PRN

## 2017-10-23 MED ORDER — BUDESONIDE 0.25 MG/2ML IN SUSP
0.2500 mg | Freq: Two times a day (BID) | RESPIRATORY_TRACT | Status: DC
  Administered 2017-10-23 – 2017-10-24 (×4): 0.25 mg via RESPIRATORY_TRACT
  Filled 2017-10-23 (×4): qty 2

## 2017-10-23 MED ORDER — ACETAMINOPHEN 650 MG RE SUPP: 650.0000 mg | Freq: Four times a day (QID) | RECTAL | Status: DC | PRN

## 2017-10-23 MED ORDER — ACETAMINOPHEN 325 MG PO TABS
650.0000 mg | ORAL_TABLET | Freq: Four times a day (QID) | ORAL | Status: DC | PRN
  Administered 2017-10-23 – 2017-10-24 (×3): 650 mg via ORAL
  Filled 2017-10-23 (×3): qty 2

## 2017-10-23 MED ORDER — ONDANSETRON HCL 4 MG/2ML IJ SOLN: 4.0000 mg | Freq: Four times a day (QID) | INTRAMUSCULAR | Status: DC | PRN

## 2017-10-23 MED ORDER — METHYLPREDNISOLONE SODIUM SUCC 40 MG IJ SOLR
40.0000 mg | Freq: Two times a day (BID) | INTRAMUSCULAR | Status: DC
  Administered 2017-10-23 – 2017-10-24 (×3): 40 mg via INTRAVENOUS
  Filled 2017-10-23 (×3): qty 1

## 2017-10-23 MED ORDER — ALBUTEROL SULFATE (2.5 MG/3ML) 0.083% IN NEBU: 2.5000 mg | INHALATION_SOLUTION | RESPIRATORY_TRACT | Status: DC | PRN

## 2017-10-23 MED ORDER — ONDANSETRON HCL 4 MG PO TABS: 4.0000 mg | ORAL_TABLET | Freq: Four times a day (QID) | ORAL | Status: DC | PRN

## 2017-10-23 MED ORDER — ALBUTEROL SULFATE (2.5 MG/3ML) 0.083% IN NEBU
2.5000 mg | INHALATION_SOLUTION | RESPIRATORY_TRACT | Status: DC
  Administered 2017-10-23: 2.5 mg via RESPIRATORY_TRACT
  Filled 2017-10-23: qty 3

## 2017-10-23 MED ORDER — PNEUMOCOCCAL VAC POLYVALENT 25 MCG/0.5ML IJ INJ
0.5000 mL | INJECTION | INTRAMUSCULAR | Status: AC
  Administered 2017-10-24: 0.5 mL via INTRAMUSCULAR
  Filled 2017-10-23: qty 0.5

## 2017-10-23 MED ORDER — ALBUTEROL SULFATE (2.5 MG/3ML) 0.083% IN NEBU
2.5000 mg | INHALATION_SOLUTION | Freq: Four times a day (QID) | RESPIRATORY_TRACT | Status: DC
  Administered 2017-10-23 – 2017-10-24 (×5): 2.5 mg via RESPIRATORY_TRACT
  Filled 2017-10-23 (×5): qty 3

## 2017-10-23 MED ORDER — GUAIFENESIN 100 MG/5ML PO SOLN
5.0000 mL | ORAL | Status: DC | PRN
  Administered 2017-10-23: 100 mg via ORAL
  Filled 2017-10-23: qty 10

## 2017-10-23 MED ORDER — ENOXAPARIN SODIUM 40 MG/0.4ML ~~LOC~~ SOLN
40.0000 mg | Freq: Every day | SUBCUTANEOUS | Status: DC
  Administered 2017-10-23: 40 mg via SUBCUTANEOUS
  Filled 2017-10-23: qty 0.4

## 2017-10-23 NOTE — Progress Notes (Signed)
CRITICAL VALUE ALERT  Critical Value:  Respriatory Virus Panel positive for RSV  Date & Time Notied:  10/23/2017 1240  Provider Notified: Dr. Bonner Puna  Orders Received/Actions taken: no new orders received.  Lori Green [facility] 10/23/2017 12:56 PM

## 2017-10-23 NOTE — H&P (Signed)
History and Physical    Lori Green LDJ:[id number] DOB: 05-23-1966 DOA: 10/22/2017  PCP: Darreld Mclean, MD  Patient coming from: Home.  Chief Complaint: Shortness of breath  HPI: Lori P Feldt is a 51 y.o. female with history of IBS, acne previous history of hypertension presently off medications presents to the ER admits in [facility] with complaints of shortness of breath and wheezing.  Patient states her symptoms started 4 days ago with wheezing and cough.  Which acutely worsened last 2 days.  Denies any chest pain fever chills.  Denies any recent travel or sick contacts.  Patient states he has not had any history of asthma.  And is not on any daily inhalers.  ED Course: In the ER patient was found to be acutely wheezing and was placed on prolonged nebulizer treatment.  Symptoms improved but still short of breath and admitted for further management of acute asthmatic bronchitis.  Chest x-ray was unremarkable EKG was showing sinus tachycardia.  Patient remained afebrile.  On exam patient is still wheezing but is able to complete sentences.  Review of Systems: As per HPI, rest all negative.   Past Medical History:  Diagnosis Date  . Asthma exacerbation 10/22/2017  . Bartholin gland cyst   . Fluid excess   . Hypertension   . Menorrhagia   . SVD (spontaneous vaginal delivery)    x 6    Past Surgical History:  Procedure Laterality Date  . BILATERAL SALPINGECTOMY Bilateral 07/19/2015   Procedure: BILATERAL SALPINGECTOMY;  Surgeon: Lavonia Drafts, MD;  Location: [facility];  Service: Gynecology;  Laterality: Bilateral;  . BREAST REDUCTION SURGERY  04/2003  . BUNIONECTOMY  06/17/12   lt  . CARPAL TUNNEL RELEASE Left 06/25/2014   Procedure: LEFT CARPAL TUNNEL RELEASE;  Surgeon: Daryll Brod, MD;  Location: [place];  Service: Orthopedics;  Laterality: Left;  . DILATATION & CURETTAGE/HYSTEROSCOPY WITH MYOSURE N/A 05/10/2015   Procedure: DILATATION &  CURETTAGE/HYSTEROSCOPY WITH MYOSURE;  Surgeon: Lavonia Drafts, MD;  Location: [facility];  Service: Gynecology;  Laterality: N/A;  . HYSTEROSCOPY WITH NOVASURE N/A 03/01/2014   Procedure: HYSTEROSCOPY WITH NOVASURE;  Surgeon: Lavonia Drafts, MD;  Location: [facility];  Service: Gynecology;  Laterality: N/A;  . OOPHORECTOMY Right 07/19/2015   Procedure: PARTIAL OOPHORECTOMY;  Surgeon: Lavonia Drafts, MD;  Location: [facility];  Service: Gynecology;  Laterality: Right;  . REDUCTION MAMMAPLASTY    . VAGINAL HYSTERECTOMY N/A 07/19/2015   Procedure: HYSTERECTOMY VAGINAL;  Surgeon: Lavonia Drafts, MD;  Location: [facility];  Service: Gynecology;  Laterality: N/A;  . WISDOM TOOTH EXTRACTION       reports that  has never smoked. she has never used smokeless tobacco. She reports that she does not drink alcohol or use drugs.  No Known Allergies  Family History  Problem Relation Age of Onset  . Heart disease Father   . Stroke Father   . Diabetes Son     Prior to Admission medications   Medication Sig Start Date End Date Taking? Authorizing Provider  doxycycline (DORYX) 150 MG EC tablet  06/28/17  Yes [provider]  linaclotide (LINZESS) 72 MCG capsule Take 1 capsule (72 mcg total) by mouth daily before breakfast. 08/01/17  Yes Lavonia Drafts, MD  metroNIDAZOLE (METROGEL) 1 % gel  06/28/17  Yes [provider]  phentermine 15 MG capsule Take 1 capsule (15 mg total) by mouth every morning. 07/15/17  Yes Copland, Gay Filler, MD  rizatriptan (MAXALT) 10 MG tablet  Take 1 tablet (10 mg total) by mouth as needed for migraine. May repeat in 2 hours if needed 07/15/17  Yes Copland, Gay Filler, MD    Physical Exam: Vitals:   10/22/17 2039 10/22/17 2130 10/22/17 2200 10/23/17 0029  BP: (!) 147/71 (!) 126/57 (!) 147/77 (!) 170/75  Pulse: (!) 107 (!) 112 (!) 114 (!) 109  Resp: (!) 24 (!) 22 (!) 21 20  Temp:    98.2 F (36.8 C)  TempSrc:    Oral  SpO2: 97% 97% 100%  98%  Weight:    103.7 kg (228 lb 9.9 oz)  Height:    5\' 4"  (1.626 m)      Constitutional: Moderately built and nourished. Vitals:   10/22/17 2039 10/22/17 2130 10/22/17 2200 10/23/17 0029  BP: (!) 147/71 (!) 126/57 (!) 147/77 (!) 170/75  Pulse: (!) 107 (!) 112 (!) 114 (!) 109  Resp: (!) 24 (!) 22 (!) 21 20  Temp:    98.2 F (36.8 C)  TempSrc:    Oral  SpO2: 97% 97% 100% 98%  Weight:    103.7 kg (228 lb 9.9 oz)  Height:    5\' 4"  (1.626 m)   Eyes: Anicteric no pallor. ENMT: No discharge from the ears eyes nose or mouth. Neck: No JVD appreciated no mass felt. Respiratory: Bilateral expiratory wheeze heard no crepitations. Cardiovascular: S1-S2 heard tachycardic. Abdomen: Soft nontender bowel sounds present. Musculoskeletal: No edema.  No joint effusion. Skin: No rash.  Skin appears warm. Neurologic: Alert awake oriented to time place and person.  Moves all extremities. Psychiatric: Appears normal.  Normal affect.   Labs on Admission: I have personally reviewed following labs and imaging studies  CBC: Recent Labs  Lab 10/22/17 1005  WBC 5.8  NEUTROABS 1.0*  HGB 12.2  HCT 36.9  MCV 85.2  PLT 284   Basic Metabolic Panel: Recent Labs  Lab 10/22/17 1005  NA 135  K 3.9  CL 101  CO2 28  GLUCOSE 106*  BUN 10  CREATININE 0.80  CALCIUM 9.1   GFR: Estimated Creatinine Clearance: 95.4 mL/min (by C-G formula based on SCr of 0.8 mg/dL). Liver Function Tests: No results for input(s): AST, ALT, ALKPHOS, BILITOT, PROT, ALBUMIN in the last 168 hours. No results for input(s): LIPASE, AMYLASE in the last 168 hours. No results for input(s): AMMONIA in the last 168 hours. Coagulation Profile: No results for input(s): INR, PROTIME in the last 168 hours. Cardiac Enzymes: Recent Labs  Lab 10/22/17 1007  TROPONINI <0.03   BNP (last 3 results) No results for input(s): PROBNP in the last 8760 hours. HbA1C: No results for input(s): HGBA1C in the last 72 hours. CBG: No  results for input(s): GLUCAP in the last 168 hours. Lipid Profile: No results for input(s): CHOL, HDL, LDLCALC, TRIG, CHOLHDL, LDLDIRECT in the last 72 hours. Thyroid Function Tests: No results for input(s): TSH, T4TOTAL, FREET4, T3FREE, THYROIDAB in the last 72 hours. Anemia Panel: No results for input(s): VITAMINB12, FOLATE, FERRITIN, TIBC, IRON, RETICCTPCT in the last 72 hours. Urine analysis:    Component Value Date/Time   COLORURINE YELLOW 06/17/2015 1940   APPEARANCEUR CLEAR 06/17/2015 1940   LABSPEC 1.023 06/17/2015 1940   PHURINE 6.5 06/17/2015 1940   GLUCOSEU NEGATIVE 06/17/2015 1940   HGBUR MODERATE (A) 06/17/2015 1940   BILIRUBINUR NEGATIVE 06/17/2015 1940   KETONESUR NEGATIVE 06/17/2015 1940   PROTEINUR NEGATIVE 06/17/2015 1940   UROBILINOGEN 1.0 06/17/2015 1940   NITRITE NEGATIVE 06/17/2015 1940   LEUKOCYTESUR  NEGATIVE 06/17/2015 1940   Sepsis Labs: @LABRCNTIP (procalcitonin:4,lacticidven:4) )No results found for this or any previous visit (from the past 240 hour(s)).   Radiological Exams on Admission: Dg Chest 2 View  Result Date: 10/22/2017 CLINICAL DATA:  Wheezing, back pain, shortness of breath EXAM: CHEST  2 VIEW COMPARISON:  03/28/2017, 12/22/2016 FINDINGS: The heart size and mediastinal contours are within normal limits. Both lungs are clear. The visualized skeletal structures are unremarkable. IMPRESSION: No active cardiopulmonary disease. Electronically Signed   By: Kathreen Devoid   On: 10/22/2017 12:01    EKG: Independently reviewed.  Sinus tachycardia.  Assessment/Plan Principal Problem:   Asthma exacerbation Active Problems:   Hypertension    1. Acute asthmatic bronchitis -I have placed patient on Solu-Medrol 40 mg IV every 12 along with Pulmicort and albuterol nebulizer.  Closely observe.  Patient is presently able to complete sentences without difficulty. 2. Elevated blood pressure -patient has previous history of blood pressure and has been off  medications now.  For now I have placed patient on as needed IV hydralazine and closely follow blood pressure trends. 3. History of IBS on Linzess. 4. History of acne on as needed doxycycline and Flagyl. 5. History of migraine on as needed Maxalt.   DVT prophylaxis: Lovenox. Code Status: Full code. Family Communication: Discussed with patient. Disposition Plan: Home. Consults called: None. Admission status: Observation.   Rise Patience MD [facility] Pager [phone].  If 7PM-7AM, please contact night-coverage [email or web address] Password [email or web address]  10/23/2017, 1:16 AM

## 2017-10-23 NOTE — Plan of Care (Signed)
  Clinical Measurements: Will remain free from infection 10/23/2017 2132 - Progressing by Ashley Murrain, RN   Clinical Measurements: Diagnostic test results will improve 10/23/2017 2132 - Progressing by Ashley Murrain, RN

## 2017-10-23 NOTE — Progress Notes (Signed)
PROGRESS NOTE  Subjective: Lori Green is a 51 y.o. female with a history of HTN and IBS who presented to [facility] for dyspnea and wheezing with persistent dry cough. She displayed elevated work of breathing in the ED despite nebulizer treatment and was put on continuous albuterol. She remained afebrile with sinus tachycardia and no hypoxia. CXR was clear, no leukocytosis. Due to ongoing dyspnea she was transferred to [facility] for further management and placed in observation earlier this morning. On my evaluation she reports limited improvement in breathing, still wheezing with bothersome hacking cough, no fevers. No sick contacts or history of asthma.  Objective: BP (!) 147/59 (BP Location: Left Arm)   Pulse (!) 112   Temp 97.6 F (36.4 C) (Oral)   Resp 20   Ht 5\' 4"  (1.626 m)   Wt 103.9 kg (229 lb 0.9 oz)   LMP 06/10/2015   SpO2 99%   BMI 39.32 kg/m   Gen: Obese, pleasant female in no distress Pulm: Tachypneic with frequent hacking nonproductive cough. Prolonged expiration with diffuse wheezing. No crackles.   CV: Regular tachycardia, no murmur, no JVD, no edema  Assessment & Plan: Acute respiratory distress due to presumed asthma/bronchitis: - Continue IV steroids for today - Placed on pulmicort - Breathing treatments scheduled and prn - Antitussives as ordered - RVP pending, empiric droplet precautions - Would benefit from PFTs as outpatient.  Sinus tachycardia: No chest pain, nonischemic ECG, neg troponin, no telemetry events.  - DC telemetry.   Hyperglycemia: Presume steroid-induced. No gap/acidosis.  - Check HbA1c - Cover with SSI for now  HTN: Per history, though no home meds. Not in severe range, possibly also worsened by steroids.  - Monitor - Hydralazine IV prn ordered  Other plan per H&P.  Vance Gather, MD [facility] Pager [phone] 10/23/2017, 10:22 AM

## 2017-10-24 DIAGNOSIS — J4521 Mild intermittent asthma with (acute) exacerbation: Secondary | ICD-10-CM

## 2017-10-24 DIAGNOSIS — I10 Essential (primary) hypertension: Secondary | ICD-10-CM

## 2017-10-24 MED ORDER — AMLODIPINE BESYLATE 10 MG PO TABS: 10.0000 mg | ORAL_TABLET | Freq: Every day | ORAL | 11 refills | Status: DC

## 2017-10-24 MED ORDER — ALBUTEROL SULFATE (2.5 MG/3ML) 0.083% IN NEBU: 2.5000 mg | INHALATION_SOLUTION | Freq: Four times a day (QID) | RESPIRATORY_TRACT | 12 refills | Status: DC | PRN

## 2017-10-24 MED ORDER — PREDNISONE 10 MG PO TABS: ORAL_TABLET | ORAL | 0 refills | Status: DC

## 2017-10-24 MED ORDER — HYDROCODONE-HOMATROPINE 5-1.5 MG/5ML PO SYRP: 5.0000 mL | ORAL_SOLUTION | ORAL | 0 refills | Status: DC | PRN

## 2017-10-24 MED ORDER — BUDESONIDE 0.25 MG/2ML IN SUSP: 0.2500 mg | Freq: Two times a day (BID) | RESPIRATORY_TRACT | 12 refills | Status: DC

## 2017-10-24 NOTE — Discharge Summary (Signed)
Physician Discharge Summary  Lori Green LZJ:[id number] DOB: 06/01/1966 DOA: 10/22/2017  PCP: Darreld Mclean, MD  Admit date: 10/22/2017 Discharge date: 10/24/2017  Time spent: 35* minutes  Recommendations for Outpatient Follow-up:  1. follow PCP in two weeks   Discharge Diagnoses:  Principal Problem:   Asthma exacerbation Active Problems:   Hypertension   Discharge Condition: stable  Diet recommendation: heart healthy diet  Filed Weights   10/23/17 0029 10/23/17 0433  Weight: 103.7 kg (228 lb 9.9 oz) 103.9 kg (229 lb 0.9 oz)    History of present illness:   51 y.o. female with a history of HTN and IBS who presented to [facility] for dyspnea and wheezing with persistent dry cough. She displayed elevated work of breathing in the ED despite nebulizer treatment and was put on continuous albuterol. She remained afebrile with sinus tachycardia and no hypoxia. CXR was clear, no leukocytosis. Due to ongoing dyspnea she was transferred to [facility] for further management and placed in observation earlier this morning. On my evaluation she reports limited improvement in breathing, still wheezing with bothersome hacking cough, no fevers. No sick contacts or history of asthma.    Hospital Course:   Asthma exacerbation/bronchitis - breathing has improved, continue Pulmicort nebulizers twice a day, start albuterol nebulizer specific service PRN for shortness of breath. Will also discharge on prednisone taper for five more days. Continue Hydrocan cough syrup PRN for cough. Will give 120 ML Hydrocan prescription. Respiratory panel by PCR positive for RSV.  Hypertension- blood pressure has been consistently elevated in the hospital. Will start amlodipine 10 mg PO daily.  Hyperglycemia-likely from steroid in the hospital. Patient is currently going to be on prednisone for five more days. Would recommend follow-up with PCP and checking hemoglobin A1c as outpatient once she is off  steroids  Procedures:  none  Consultations:  none  Discharge Exam: Vitals:   10/24/17 0700 10/24/17 0932  BP: (!) 167/84   Pulse: 80   Resp: 20   Temp: 97.7 F (36.5 C)   SpO2: 99% 97%    General: appears in no acute distress Cardiovascular: S1 S2, regular Respiratory: scattered wheezing bilaterally.  Discharge Instructions    Allergies as of 10/24/2017   No Known Allergies     Medication List    STOP taking these medications   doxycycline 150 MG EC tablet Commonly known as:  DORYX     TAKE these medications   albuterol (2.5 MG/3ML) 0.083% nebulizer solution Commonly known as:  PROVENTIL Take 3 mLs (2.5 mg total) by nebulization every 6 (six) hours as needed for wheezing or shortness of breath.   amLODipine 10 MG tablet Commonly known as:  NORVASC Take 1 tablet (10 mg total) by mouth daily.   budesonide 0.25 MG/2ML nebulizer solution Commonly known as:  PULMICORT Take 2 mLs (0.25 mg total) by nebulization 2 (two) times daily.   HYDROcodone-homatropine 5-1.5 MG/5ML syrup Commonly known as:  HYCODAN Take 5 mLs by mouth every 4 (four) hours as needed for cough.   linaclotide 72 MCG capsule Commonly known as:  LINZESS Take 1 capsule (72 mcg total) by mouth daily before breakfast.   metroNIDAZOLE 1 % gel Commonly known as:  METROGEL   phentermine 15 MG capsule Take 1 capsule (15 mg total) by mouth every morning.   predniSONE 10 MG tablet Commonly known as:  DELTASONE Prednisone 40 mg po daily x 1 day then Prednisone 30 mg po daily x 1 day then Prednisone 20 mg po  daily x 1 day then Prednisone 10 mg daily x 1 day then stop...   rizatriptan 10 MG tablet Commonly known as:  MAXALT Take 1 tablet (10 mg total) by mouth as needed for migraine. May repeat in 2 hours if needed      No Known Allergies    The results of significant diagnostics from this hospitalization (including imaging, microbiology, ancillary and laboratory) are listed below for  reference.    Significant Diagnostic Studies: Dg Chest 2 View  Result Date: 10/22/2017 CLINICAL DATA:  Wheezing, back pain, shortness of breath EXAM: CHEST  2 VIEW COMPARISON:  03/28/2017, 12/22/2016 FINDINGS: The heart size and mediastinal contours are within normal limits. Both lungs are clear. The visualized skeletal structures are unremarkable. IMPRESSION: No active cardiopulmonary disease. Electronically Signed   By: Kathreen Devoid   On: 10/22/2017 12:01    Microbiology: Recent Results (from the past 240 hour(s))  Respiratory Panel by PCR     Status: Abnormal   Collection Time: 10/23/17  1:27 AM  Result Value Ref Range Status   Adenovirus NOT DETECTED NOT DETECTED Final   Coronavirus 229E NOT DETECTED NOT DETECTED Final   Coronavirus HKU1 NOT DETECTED NOT DETECTED Final   Coronavirus NL63 NOT DETECTED NOT DETECTED Final   Coronavirus OC43 NOT DETECTED NOT DETECTED Final   Metapneumovirus NOT DETECTED NOT DETECTED Final   Rhinovirus / Enterovirus NOT DETECTED NOT DETECTED Final   Influenza A NOT DETECTED NOT DETECTED Final   Influenza B NOT DETECTED NOT DETECTED Final   Parainfluenza Virus 1 NOT DETECTED NOT DETECTED Final   Parainfluenza Virus 2 NOT DETECTED NOT DETECTED Final   Parainfluenza Virus 3 NOT DETECTED NOT DETECTED Final   Parainfluenza Virus 4 NOT DETECTED NOT DETECTED Final   Respiratory Syncytial Virus DETECTED (A) NOT DETECTED Final    Comment: CRITICAL RESULT CALLED TO, READ BACK BY AND VERIFIED WITH: Anise Salvo RN 12:30 10/23/17 ([email or web address])    Bordetella pertussis NOT DETECTED NOT DETECTED Final   Chlamydophila pneumoniae NOT DETECTED NOT DETECTED Final   Mycoplasma pneumoniae NOT DETECTED NOT DETECTED Final     Labs: Basic Metabolic Panel: Recent Labs  Lab 10/22/17 1005 10/23/17 0425  NA 135 135  K 3.9 4.7  CL 101 104  CO2 28 24  GLUCOSE 106* 254*  BUN 10 11  CREATININE 0.80 0.88  CALCIUM 9.1 9.1   Liver Function Tests: No results for input(s):  AST, ALT, ALKPHOS, BILITOT, PROT, ALBUMIN in the last 168 hours. No results for input(s): LIPASE, AMYLASE in the last 168 hours. No results for input(s): AMMONIA in the last 168 hours. CBC: Recent Labs  Lab 10/22/17 1005 10/23/17 0425  WBC 5.8 9.7  NEUTROABS 1.0*  --   HGB 12.2 11.4*  HCT 36.9 34.9*  MCV 85.2 86.2  PLT 268 259   Cardiac Enzymes: Recent Labs  Lab 10/22/17 1007  TROPONINI <0.03   BNP: BNP (last 3 results) No results for input(s): BNP in the last 8760 hours.  ProBNP (last 3 results) No results for input(s): PROBNP in the last 8760 hours.  CBG: No results for input(s): GLUCAP in the last 168 hours.     Signed:  Oswald Hillock MD.  [facility] 10/24/2017, 10:50 AM

## 2018-01-24 ENCOUNTER — Telehealth: Payer: Self-pay | Admitting: *Deleted

## 2018-01-24 NOTE — Telephone Encounter (Signed)
Received request for Medical Records from [place]; forwarded to [place] for email/scan/SLS 03/22

## 2018-01-27 ENCOUNTER — Other Ambulatory Visit: Admit: 2018-01-27 | Discharge: 2018-01-28

## 2018-01-27 DIAGNOSIS — R69 Illness, unspecified: Principal | ICD-10-CM

## 2018-01-31 NOTE — Telephone Encounter (Signed)
Received Duplicate [85/92/92] request for Medical Records from [place]; forwarded to [place] for email/scan/SLS 03/29

## 2018-02-14 ENCOUNTER — Other Ambulatory Visit: Payer: Self-pay

## 2018-02-14 ENCOUNTER — Emergency Department (HOSPITAL_BASED_OUTPATIENT_CLINIC_OR_DEPARTMENT_OTHER)
Admission: EM | Admit: 2018-02-14 | Discharge: 2018-02-14 | Disposition: A | Discharge status: Home / Self Care | Payer: Self-pay | Attending: Emergency Medicine | Admitting: Emergency Medicine

## 2018-02-14 ENCOUNTER — Emergency Department (HOSPITAL_BASED_OUTPATIENT_CLINIC_OR_DEPARTMENT_OTHER): Payer: Self-pay

## 2018-02-14 ENCOUNTER — Encounter (HOSPITAL_BASED_OUTPATIENT_CLINIC_OR_DEPARTMENT_OTHER): Payer: Self-pay

## 2018-02-14 DIAGNOSIS — I10 Essential (primary) hypertension: Secondary | ICD-10-CM | POA: Insufficient documentation

## 2018-02-14 DIAGNOSIS — R600 Localized edema: Secondary | ICD-10-CM | POA: Insufficient documentation

## 2018-02-14 DIAGNOSIS — Z79899 Other long term (current) drug therapy: Secondary | ICD-10-CM | POA: Insufficient documentation

## 2018-02-14 DIAGNOSIS — J45909 Unspecified asthma, uncomplicated: Secondary | ICD-10-CM | POA: Insufficient documentation

## 2018-02-14 LAB — COMPREHENSIVE METABOLIC PANEL
ALBUMIN: 4.1 g/dL (ref 3.5–5.0)
ALT: 20 U/L (ref 14–54)
AST: 24 U/L (ref 15–41)
Alkaline Phosphatase: 77 U/L (ref 38–126)
Anion gap: 8 (ref 5–15)
BUN: 12 mg/dL (ref 6–20)
CHLORIDE: 104 mmol/L (ref 101–111)
CO2: 24 mmol/L (ref 22–32)
CREATININE: 0.88 mg/dL (ref 0.44–1.00)
Calcium: 9.2 mg/dL (ref 8.9–10.3)
GFR calc Af Amer: >60 mL/min (ref >60)
GLUCOSE: 132 mg/dL — AB (ref 65–99)
POTASSIUM: 3.8 mmol/L (ref 3.5–5.1)
SODIUM: 136 mmol/L (ref 135–145)
Total Bilirubin: 0.2 mg/dL — ABNORMAL LOW (ref 0.3–1.2)
Total Protein: 7.9 g/dL (ref 6.5–8.1)

## 2018-02-14 LAB — CBC
HEMATOCRIT: 34.3 % — AB (ref 36.0–46.0)
HEMOGLOBIN: 11.3 g/dL — AB (ref 12.0–15.0)
MCH: 28.3 pg (ref 26.0–34.0)
MCHC: 32.9 g/dL (ref 30.0–36.0)
MCV: 86 fL (ref 78.0–100.0)
Platelets: 281 10*3/uL (ref 150–400)
RBC: 3.99 MIL/uL (ref 3.87–5.11)
RDW: 13.5 % (ref 11.5–15.5)
WBC: 6.5 10*3/uL (ref 4.0–10.5)

## 2018-02-14 LAB — PREGNANCY, URINE: PREG TEST UR: NEGATIVE

## 2018-02-14 LAB — BRAIN NATRIURETIC PEPTIDE: B NATRIURETIC PEPTIDE 5: 6.7 pg/mL (ref 0.0–100.0)

## 2018-02-14 LAB — TROPONIN I: Troponin I: <0.03 ng/mL (ref <0.03)

## 2018-02-14 MED ORDER — FUROSEMIDE 20 MG PO TABS: 20.0000 mg | ORAL_TABLET | Freq: Every day | ORAL | 0 refills | Status: DC

## 2018-02-14 NOTE — ED Provider Notes (Signed)
[facility] EMERGENCY DEPARTMENT Provider Note   CSN: [id number] Arrival date & time: 02/14/18  1821     History   Chief Complaint Chief Complaint  Patient presents with  . Palpitations    HPI Lori Green is a 52 y.o. female.  Patient is a 52 year old female with a history of asthma, hypertension who is presenting today with 1 week of worsening lower extremity edema and abdominal tightness with mild exertional dyspnea and orthopnea that started 2 days ago.  Patient states she was at a bowling tournament this weekend out of town at the end of the day she noted her feet would not fit in her shoes.  It is since that time just gradually worsened.  Then yesterday she started noticing the shortness of breath when she walked and when she lie down at night.  She also noted yesterday that it felt like her heart was beating fast and then slowing down and beating irregularly.  That was the first time that it happened last night.  However patient states that that had happened in September 2018 and she went and saw a cardiologist and had a stress echo, and ultrasound and various other tests that all seem to be normal.  Patient does not take any blood pressure medications and has not had any recent medication changes.  She does note that since February she has had a cough that has been intermittently productive but that does not seem to have changed.  Patient does not check her weight regularly and is unsure if she has gained weight in the last week.  She denies any abdominal pain, nausea or vomiting.  The history is provided by the patient.  Palpitations   This is a recurrent problem.    Past Medical History:  Diagnosis Date  . Asthma exacerbation 10/22/2017  . Bartholin gland cyst   . Fluid excess   . Hypertension   . Menorrhagia   . SVD (spontaneous vaginal delivery)    x 6    Patient Active Problem List   Diagnosis Date Noted  . Asthma exacerbation 10/22/2017  . Migraine  without aura and without status migrainosus, not intractable 04/29/2017  . Hemorrhagic ovarian cyst 07/19/2015  . Postoperative state 07/19/2015  . Hypertension 09/23/2012  . Obesity 09/23/2012  . Dysfunctional uterine bleeding 09/12/2012    Past Surgical History:  Procedure Laterality Date  . BILATERAL SALPINGECTOMY Bilateral 07/19/2015   Procedure: BILATERAL SALPINGECTOMY;  Surgeon: Lavonia Drafts, MD;  Location: [facility];  Service: Gynecology;  Laterality: Bilateral;  . BREAST REDUCTION SURGERY  04/2003  . BUNIONECTOMY  06/17/12   lt  . CARPAL TUNNEL RELEASE Left 06/25/2014   Procedure: LEFT CARPAL TUNNEL RELEASE;  Surgeon: Daryll Brod, MD;  Location: [facility];  Service: Orthopedics;  Laterality: Left;  . DILATATION & CURETTAGE/HYSTEROSCOPY WITH MYOSURE N/A 05/10/2015   Procedure: DILATATION & CURETTAGE/HYSTEROSCOPY WITH MYOSURE;  Surgeon: Lavonia Drafts, MD;  Location: [facility];  Service: Gynecology;  Laterality: N/A;  . HYSTEROSCOPY WITH NOVASURE N/A 03/01/2014   Procedure: HYSTEROSCOPY WITH NOVASURE;  Surgeon: Lavonia Drafts, MD;  Location: [facility];  Service: Gynecology;  Laterality: N/A;  . OOPHORECTOMY Right 07/19/2015   Procedure: PARTIAL OOPHORECTOMY;  Surgeon: Lavonia Drafts, MD;  Location: [facility];  Service: Gynecology;  Laterality: Right;  . REDUCTION MAMMAPLASTY    . VAGINAL HYSTERECTOMY N/A 07/19/2015   Procedure: HYSTERECTOMY VAGINAL;  Surgeon: Lavonia Drafts, MD;  Location: [facility];  Service: Gynecology;  Laterality: N/A;  .  WISDOM TOOTH EXTRACTION       OB History    Gravida  6   Para  6   Term  6   Preterm      AB      Living  6     SAB      TAB      Ectopic      Multiple      Live Births               Home Medications    Prior to Admission medications   Medication Sig Start Date End Date Taking? Authorizing Provider  albuterol (PROVENTIL) (2.5 MG/3ML) 0.083% nebulizer solution Take 3 mLs (2.5 mg  total) by nebulization every 6 (six) hours as needed for wheezing or shortness of breath. 10/24/17   Oswald Hillock, MD  amLODipine (NORVASC) 10 MG tablet Take 1 tablet (10 mg total) by mouth daily. 10/24/17 10/24/18  Oswald Hillock, MD  budesonide (PULMICORT) 0.25 MG/2ML nebulizer solution Take 2 mLs (0.25 mg total) by nebulization 2 (two) times daily. 10/24/17   Oswald Hillock, MD  HYDROcodone-homatropine [facility]) 5-1.5 MG/5ML syrup Take 5 mLs by mouth every 4 (four) hours as needed for cough. 10/24/17   Oswald Hillock, MD  linaclotide Rolan Lipa) 72 MCG capsule Take 1 capsule (72 mcg total) by mouth daily before breakfast. 08/01/17   Lavonia Drafts, MD  metroNIDAZOLE (METROGEL) 1 % gel  06/28/17   [provider]  phentermine 15 MG capsule Take 1 capsule (15 mg total) by mouth every morning. 07/15/17   Copland, Gay Filler, MD  predniSONE (DELTASONE) 10 MG tablet Prednisone 40 mg po daily x 1 day then Prednisone 30 mg po daily x 1 day then Prednisone 20 mg po daily x 1 day then Prednisone 10 mg daily x 1 day then stop... 10/24/17   Oswald Hillock, MD  rizatriptan (MAXALT) 10 MG tablet Take 1 tablet (10 mg total) by mouth as needed for migraine. May repeat in 2 hours if needed 07/15/17   Copland, Gay Filler, MD    Family History Family History  Problem Relation Age of Onset  . Heart disease Father   . Stroke Father   . Diabetes Son     Social History Social History   Tobacco Use  . Smoking status: Never Smoker  . Smokeless tobacco: Never Used  Substance Use Topics  . Alcohol use: No  . Drug use: No     Allergies   Patient has no known allergies.   Review of Systems Review of Systems  Cardiovascular: Positive for palpitations.  All other systems reviewed and are negative.    Physical Exam Updated Vital Signs BP (!) 163/85 (BP Location: Right Arm)   Pulse 89   Temp 99.7 F (37.6 C) (Oral)   Resp 18   Ht 5\' 4"  (1.626 m)   Wt 102.5 kg (226 lb)   LMP 06/10/2015    SpO2 99%   BMI 38.79 kg/m   Physical Exam  Constitutional: She is oriented to person, place, and time. She appears well-developed and well-nourished. No distress.  HENT:  Head: Normocephalic and atraumatic.  Mouth/Throat: Oropharynx is clear and moist.  Eyes: Pupils are equal, round, and reactive to light. Conjunctivae and EOM are normal.  Neck: Normal range of motion. Neck supple.  Cardiovascular: Normal rate, regular rhythm and intact distal pulses.  No murmur heard. Pulmonary/Chest: Effort normal and breath sounds normal. No respiratory distress. She has no  wheezes. She has no rales.  Abdominal: Soft. She exhibits distension. There is no tenderness. There is no rebound and no guarding.  Musculoskeletal: Normal range of motion. She exhibits edema. She exhibits no tenderness.  Neurological: She is alert and oriented to person, place, and time.  Skin: Skin is warm and dry. No rash noted. No erythema.  Psychiatric: She has a normal mood and affect. Her behavior is normal.  Nursing note and vitals reviewed.    ED Treatments / Results  Labs (all labs ordered are listed, but only abnormal results are displayed) Labs Reviewed  CBC - Abnormal; Notable for the following components:      Result Value   Hemoglobin 11.3 (*)    HCT 34.3 (*)    All other components within normal limits  COMPREHENSIVE METABOLIC PANEL - Abnormal; Notable for the following components:   Glucose, Bld 132 (*)    Total Bilirubin 0.2 (*)    All other components within normal limits  TROPONIN I  PREGNANCY, URINE  BRAIN NATRIURETIC PEPTIDE    EKG EKG Interpretation  Date/Time:  Friday February 14 2018 18:36:21 EDT Ventricular Rate:  91 PR Interval:  140 QRS Duration: 82 QT Interval:  368 QTC Calculation: 452 R Axis:     Text Interpretation:  Normal sinus rhythm Normal ECG No significant change since last tracing Confirmed by Blanchie Dessert [id number]) on 02/14/2018 7:04:31 PM   Radiology Dg Chest 2  View  Result Date: 02/14/2018 CLINICAL DATA:  Palpitation EXAM: CHEST - 2 VIEW COMPARISON:  10/22/2017 FINDINGS: The heart size and mediastinal contours are within normal limits. Both lungs are clear. Mild degenerative changes of the spine. IMPRESSION: No active cardiopulmonary disease. Electronically Signed   By: Donavan Foil M.D.   On: 02/14/2018 19:17    Procedures Procedures (including critical care time)  Medications Ordered in ED Medications - No data to display   Initial Impression / Assessment and Plan / ED Course  I have reviewed the triage vital signs and the nursing notes.  Pertinent labs & imaging results that were available during my care of the patient were reviewed by me and considered in my medical decision making (see chart for details).     Patient is a 52 year old female presenting with 6 days of lower extremity edema, now developing some exertional dyspnea and orthopnea.  She had some palpitations yesterday but denies any currently.  Patient denies any chest pain and is otherwise well-appearing.  Vital signs with some mild hypertension with a blood pressure of 163/85.  Concern for possible CHF versus renal abnormality versus anemia.  Patient's EKG is in normal sinus rhythm at this time and no acute findings.  Her chest x-ray is within normal limits no signs of CHF or cardiomegaly. CBC within normal limits and urine pregnancy test is negative.  Troponin and CMP are pending.  Patient did have a stress treadmill test and echo done on 7/18 and both without acute abnormalities with EF between 60 and 65%.  9:07 PM Patient's labs are reassuring with a stable hemoglobin, negative troponin, normal BNP and normal creatinine.  Patient does not have a good explanation for her peripheral edema.  Findings discussed with the patient.  We will give her 5 days of Lasix to see if that helps with the swelling however encouraged her to follow-up with her PCP.  Final Clinical  Impressions(s) / ED Diagnoses   Final diagnoses:  Lower extremity edema    ED Discharge Orders  Ordered    furosemide (LASIX) 20 MG tablet  Daily     02/14/18 2104       Blanchie Dessert, MD 02/14/18 2108

## 2018-02-14 NOTE — ED Triage Notes (Signed)
Pt states that her heart feels like it is beating abnormally since Saturday, also started having bilateral lower extremity swelling that started on Tuesday with mild SOB

## 2018-02-20 ENCOUNTER — Encounter: Payer: Self-pay | Admitting: Family Medicine

## 2018-02-20 DIAGNOSIS — R600 Localized edema: Secondary | ICD-10-CM

## 2018-02-21 MED ORDER — FUROSEMIDE 20 MG PO TABS
20.0000 mg | ORAL_TABLET | Freq: Every day | ORAL | 0 refills | Status: DC
Start: 2018-02-21 — End: 2018-02-25

## 2018-02-24 ENCOUNTER — Telehealth: Payer: Self-pay | Admitting: Family Medicine

## 2018-02-24 NOTE — Telephone Encounter (Signed)
Copied from [place] [id number]. Topic: Quick Communication - Rx Refill/Question >> Feb 24, 2018  5:14 PM Waylan Rocher, Lumin L wrote: Medication: furosemide (LASIX) 20 MG tablet Has the patient contacted their pharmacy? Yes.   (Agent: If no, request that the patient contact the pharmacy for the refill.) Preferred Pharmacy (with phone number or street name): [facility] [phone], [place] [address] Phone: [phone] Fax: [phone] Agent: Please be advised that RX refills may take up to 3 business days. We ask that you follow-up with your pharmacy.  Pharmacy calling because script was sent on 04/19 but they did not receive it.

## 2018-02-25 ENCOUNTER — Other Ambulatory Visit: Payer: Self-pay | Admitting: *Deleted

## 2018-02-25 DIAGNOSIS — R600 Localized edema: Secondary | ICD-10-CM

## 2018-02-25 MED ORDER — FUROSEMIDE 20 MG PO TABS: 20.0000 mg | ORAL_TABLET | Freq: Every day | ORAL | 0 refills | Status: DC

## 2018-02-26 ENCOUNTER — Other Ambulatory Visit: Payer: Self-pay | Admitting: Emergency Medicine

## 2018-02-26 DIAGNOSIS — R600 Localized edema: Secondary | ICD-10-CM

## 2018-02-26 MED ORDER — FUROSEMIDE 20 MG PO TABS: 20.0000 mg | ORAL_TABLET | Freq: Every day | ORAL | 0 refills | Status: DC

## 2018-02-26 NOTE — Telephone Encounter (Signed)
Refill resent per pharmacy request.

## 2018-03-12 ENCOUNTER — Ambulatory Visit: Admit: 2018-03-12 | Discharge: 2018-03-13 | Payer: PRIVATE HEALTH INSURANCE

## 2018-03-25 ENCOUNTER — Encounter: Payer: Self-pay | Admitting: Obstetrics & Gynecology

## 2018-04-02 ENCOUNTER — Other Ambulatory Visit: Payer: Self-pay | Admitting: Family Medicine

## 2018-04-02 DIAGNOSIS — R600 Localized edema: Secondary | ICD-10-CM

## 2018-05-06 ENCOUNTER — Ambulatory Visit: Admit: 2018-05-06 | Discharge: 2018-05-07 | Payer: PRIVATE HEALTH INSURANCE

## 2018-05-06 ENCOUNTER — Ambulatory Visit: Admit: 2018-05-06 | Discharge: 2018-05-07 | Payer: PRIVATE HEALTH INSURANCE | Attending: Family | Primary: Family

## 2018-05-06 DIAGNOSIS — M25561 Pain in right knee: Principal | ICD-10-CM

## 2018-05-06 DIAGNOSIS — M1711 Unilateral primary osteoarthritis, right knee: Secondary | ICD-10-CM

## 2018-05-06 DIAGNOSIS — M25461 Effusion, right knee: Principal | ICD-10-CM

## 2018-05-06 MED ORDER — MELOXICAM 7.5 MG TABLET
ORAL_TABLET | 2 refills | 0 days | Status: CP
Start: 2018-05-06 — End: ?

## 2018-05-27 MED ORDER — DICLOFENAC 1 % TOPICAL GEL
2 refills | 0 days | Status: CP
Start: 2018-05-27 — End: ?

## 2018-05-27 MED ORDER — IBUPROFEN 800 MG TABLET
ORAL_TABLET | 2 refills | 0 days | Status: CP
Start: 2018-05-27 — End: 2019-02-02

## 2018-06-03 ENCOUNTER — Ambulatory Visit: Admit: 2018-06-03 | Discharge: 2018-06-04 | Payer: PRIVATE HEALTH INSURANCE | Attending: Family | Primary: Family

## 2018-06-03 DIAGNOSIS — M1711 Unilateral primary osteoarthritis, right knee: Principal | ICD-10-CM

## 2018-07-09 ENCOUNTER — Telehealth: Payer: Self-pay

## 2018-07-09 DIAGNOSIS — R102 Pelvic and perineal pain: Secondary | ICD-10-CM

## 2018-07-09 MED ORDER — FLUCONAZOLE 150 MG PO TABS: 150.0000 mg | ORAL_TABLET | Freq: Once | ORAL | 0 refills | Status: AC

## 2018-07-09 NOTE — Telephone Encounter (Signed)
Via Mychart message pt request Diflucan 150mg  tab sent to pharmacy on file per protocol.

## 2018-09-30 ENCOUNTER — Other Ambulatory Visit: Payer: Self-pay | Admitting: Family Medicine

## 2018-09-30 DIAGNOSIS — Z1231 Encounter for screening mammogram for malignant neoplasm of breast: Secondary | ICD-10-CM

## 2018-10-07 ENCOUNTER — Telehealth: Payer: Self-pay

## 2018-10-07 NOTE — Telephone Encounter (Signed)
Called pt to advise of Mammogram appointment in January, no answer, left VM.

## 2018-10-07 NOTE — Telephone Encounter (Signed)
LM for pt that her mammogram has been scheduled for 11/13/18 @ 0700 at the [facility] and we will also send you a MyChart message as well.

## 2018-11-13 ENCOUNTER — Ambulatory Visit
Admission: RE | Admit: 2018-11-13 | Discharge: 2018-11-13 | Disposition: A | Discharge status: Home / Self Care | Payer: Commercial Managed Care - PPO | Source: Ambulatory Visit | Attending: Family Medicine | Admitting: Family Medicine

## 2018-11-13 DIAGNOSIS — Z1231 Encounter for screening mammogram for malignant neoplasm of breast: Secondary | ICD-10-CM

## 2018-12-16 ENCOUNTER — Ambulatory Visit: Admit: 2018-12-16 | Discharge: 2018-12-17 | Payer: PRIVATE HEALTH INSURANCE | Attending: Family | Primary: Family

## 2018-12-16 DIAGNOSIS — M1711 Unilateral primary osteoarthritis, right knee: Principal | ICD-10-CM

## 2019-02-02 MED ORDER — IBUPROFEN 800 MG TABLET
ORAL_TABLET | 2 refills | 0 days | Status: CP
Start: 2019-02-02 — End: ?

## 2019-02-22 ENCOUNTER — Encounter: Payer: Self-pay | Admitting: Family Medicine

## 2019-04-20 ENCOUNTER — Other Ambulatory Visit: Payer: Self-pay

## 2019-04-20 MED ORDER — FLUCONAZOLE 100 MG PO TABS: 100.0000 mg | ORAL_TABLET | Freq: Every day | ORAL | 0 refills | Status: DC

## 2019-04-20 NOTE — Telephone Encounter (Signed)
Patient is having cottage cheese like discharge and itching. Has tried over the counter medications with no help. Needs to schedule annual exam.  Will send in diflucan. Kathrene Alu RN

## 2019-05-20 ENCOUNTER — Ambulatory Visit (INDEPENDENT_AMBULATORY_CARE_PROVIDER_SITE_OTHER): Payer: Commercial Managed Care - PPO | Admitting: Obstetrics & Gynecology

## 2019-05-20 ENCOUNTER — Other Ambulatory Visit: Payer: Self-pay

## 2019-05-20 ENCOUNTER — Encounter: Payer: Self-pay | Admitting: Obstetrics & Gynecology

## 2019-05-20 VITALS — BP 142/71 | HR 85 | Ht 64.0 in | Wt 240.0 lb

## 2019-05-20 DIAGNOSIS — Z01419 Encounter for gynecological examination (general) (routine) without abnormal findings: Secondary | ICD-10-CM | POA: Diagnosis not present

## 2019-05-20 DIAGNOSIS — N898 Other specified noninflammatory disorders of vagina: Secondary | ICD-10-CM

## 2019-05-20 NOTE — Progress Notes (Signed)
Pt had mammogram Jan 2020 Hysterectomy 2016

## 2019-05-20 NOTE — Progress Notes (Signed)
Subjective:     Lori Green is a 53 y.o. female here for a routine exam.  Current complaints: pt is worried about excessive weight gain. She has been having back pain which has limited her exercise. She reports occ intermittent whit discharge that is not assoc with itching or pain.   Pt is currently working outside of her home. Wears a mask and face shield      Gynecologic History Patient's last menstrual period was 06/10/2015. Contraception: status post hysterectomy Last Pap: 09/2012. Results were: normal Last mammogram: 11/13/2018. Results were: normal  Obstetric History OB History  Gravida Para Term Preterm AB Living  6 6 6     6   SAB TAB Ectopic Multiple Live Births          6    # Outcome Date GA Lbr Len/2nd Weight Sex Delivery Anes PTL Lv  6 Term      Vag-Spont     5 Term      Vag-Spont     4 Term      Vag-Spont     3 Term      Vag-Spont     2 Term      Vag-Spont     1 Term      Vag-Spont      The following portions of the patient's history were reviewed and updated as appropriate: allergies, current medications, past family history, past medical history, past social history, past surgical history and problem list.  Review of Systems Pertinent items are noted in HPI.    Objective:  BP (!) 142/71   Pulse 85   Ht 5\' 4"  (1.626 m)   Wt 240 lb (108.9 kg)   LMP 06/10/2015   BMI 41.20 kg/m  Increased 23# since 2016  General Appearance:    Alert, cooperative, no distress, appears stated age  Head:    Normocephalic, without obvious abnormality, atraumatic  Eyes:    conjunctiva/corneas clear, EOM's intact, both eyes  Ears:    Normal external ear canals, both ears  Nose:   Nares normal, septum midline, mucosa normal, no drainage    or sinus tenderness  Throat:   Lips, mucosa, and tongue normal; teeth and gums normal  Neck:   Supple, symmetrical, trachea midline, no adenopathy;    thyroid:  no enlargement/tenderness/nodules  Back:     Symmetric, no curvature, ROM normal,  no CVA tenderness  Lungs:     respirations unlabored  Chest Wall:    No tenderness or deformity   Heart:    Regular rate and rhythm  Breast Exam:    No tenderness, masses, or nipple abnormality  Abdomen:     Soft, non-tender, bowel sounds active all four quadrants,    no masses, no organomegaly  Genitalia:    Normal female without lesion, discharge or tenderness     Extremities:   Extremities normal, atraumatic, no cyanosis or edema  Pulses:   2+ and symmetric all extremities  Skin:   Skin color, texture, turgor normal, no rashes or lesions     Assessment:    Healthy female exam.  Not fasting Excessive weight gain  intermittent vaginal discharge.    Plan:   F/u in 1 year or sooner prn   F/u for fasting labs in am:  HgbA1c, TSH, CBC, Lipid panel, CMP, vit D   Reviewed diet and exercise- referred to Dr. Leafy Ro for medical weight management F/u Affirm test.   Hoyle Sauer L. Harraway-Smith, M.D., [name]

## 2019-05-22 ENCOUNTER — Other Ambulatory Visit: Payer: Commercial Managed Care - PPO

## 2019-05-22 ENCOUNTER — Other Ambulatory Visit: Payer: Self-pay

## 2019-05-22 DIAGNOSIS — Z01419 Encounter for gynecological examination (general) (routine) without abnormal findings: Secondary | ICD-10-CM

## 2019-05-22 DIAGNOSIS — Z131 Encounter for screening for diabetes mellitus: Secondary | ICD-10-CM

## 2019-05-22 NOTE — Progress Notes (Signed)
Patient sent to lab for blood draw. Kathrene Alu Rn

## 2019-05-23 LAB — HEMOGLOBIN A1C
Est. average glucose Bld gHb Est-mCnc: 146 mg/dL
Hgb A1c MFr Bld: 6.7 % — ABNORMAL HIGH (ref 4.8–5.6)

## 2019-05-23 LAB — COMPREHENSIVE METABOLIC PANEL
ALT: 19 IU/L (ref 0–32)
AST: 18 IU/L (ref 0–40)
Albumin/Globulin Ratio: 1.3 (ref 1.2–2.2)
Albumin: 4.4 g/dL (ref 3.8–4.9)
Alkaline Phosphatase: 82 IU/L (ref 39–117)
BUN/Creatinine Ratio: 12 (ref 9–23)
BUN: 11 mg/dL (ref 6–24)
Bilirubin Total: 0.3 mg/dL (ref 0.0–1.2)
CO2: 21 mmol/L (ref 20–29)
Calcium: 9.7 mg/dL (ref 8.7–10.2)
Chloride: 101 mmol/L (ref 96–106)
Creatinine, Ser: 0.93 mg/dL (ref 0.57–1.00)
GFR calc Af Amer: 80 mL/min/{1.73_m2} (ref >59)
GFR calc non Af Amer: 69 mL/min/{1.73_m2} (ref >59)
Globulin, Total: 3.3 g/dL (ref 1.5–4.5)
Glucose: 99 mg/dL (ref 65–99)
Potassium: 4.7 mmol/L (ref 3.5–5.2)
Sodium: 139 mmol/L (ref 134–144)
Total Protein: 7.7 g/dL (ref 6.0–8.5)

## 2019-05-23 LAB — LIPID PANEL
Chol/HDL Ratio: 5.6 ratio — ABNORMAL HIGH (ref 0.0–4.4)
Cholesterol, Total: 280 mg/dL — ABNORMAL HIGH (ref 100–199)
HDL: 50 mg/dL (ref >39)
LDL Calculated: 185 mg/dL — ABNORMAL HIGH (ref 0–99)
Triglycerides: 224 mg/dL — ABNORMAL HIGH (ref 0–149)
VLDL Cholesterol Cal: 45 mg/dL — ABNORMAL HIGH (ref 5–40)

## 2019-05-23 LAB — CBC
Hematocrit: 36.9 % (ref 34.0–46.6)
Hemoglobin: 12.1 g/dL (ref 11.1–15.9)
MCH: 28.3 pg (ref 26.6–33.0)
MCHC: 32.8 g/dL (ref 31.5–35.7)
MCV: 86 fL (ref 79–97)
Platelets: 288 10*3/uL (ref 150–450)
RBC: 4.28 x10E6/uL (ref 3.77–5.28)
RDW: 13.8 % (ref 11.7–15.4)
WBC: 4.6 10*3/uL (ref 3.4–10.8)

## 2019-05-23 LAB — VITAMIN D 25 HYDROXY (VIT D DEFICIENCY, FRACTURES): Vit D, 25-Hydroxy: 8.6 ng/mL — ABNORMAL LOW (ref 30.0–100.0)

## 2019-05-23 LAB — TSH: TSH: 2.34 u[IU]/mL (ref 0.450–4.500)

## 2019-05-25 ENCOUNTER — Telehealth: Payer: Self-pay

## 2019-05-25 ENCOUNTER — Encounter: Payer: Self-pay | Admitting: Family Medicine

## 2019-05-25 LAB — CERVICOVAGINAL ANCILLARY ONLY
Bacterial vaginitis: NEGATIVE
Candida vaginitis: NEGATIVE

## 2019-05-25 NOTE — Telephone Encounter (Signed)
Called pt with results. Pt advised to f/u with PCP. Understanding was voiced. Lori Green, CMA

## 2019-05-25 NOTE — Telephone Encounter (Signed)
-----   Message from Lavonia Drafts, MD sent at 05/25/2019 11:55 AM EDT ----- Please call pt. Her HgAic is elevated as well as her lipids. Her vit D is low. I have sent her a note but, want her to be called as well. She needs to f/u with primary care. Dr. Edilia Bo is listed.   Thx,  Clh-S

## 2019-05-31 NOTE — Progress Notes (Deleted)
[facility] [address] [place], [place] [address] [phone]  Date:  06/01/2019   Name:  Lori Green   DOB:  06-29-1966   MRN:  [id number]  PCP:  Darreld Mclean, MD    Chief Complaint: No chief complaint on file.   History of Present Illness:  Lori Green is a 53 y.o. very pleasant female patient who presents with the following:  Pt with history of HTN Virtual visit today to discuss labs  I have seen her once in the past about 2 years ago  Her GYN is Dr. Sheldon Silvan- she was recently noted to have an elevated A1c and low vit D at GYN appt This was her first A1c check Lab Results  Component Value Date   HGBA1C 6.7 (H) 05/22/2019   Vit D low at 8.6  Patient Active Problem List   Diagnosis Date Noted  . Asthma exacerbation 10/22/2017  . Migraine without aura and without status migrainosus, not intractable 04/29/2017  . Hemorrhagic ovarian cyst 07/19/2015  . Postoperative state 07/19/2015  . Hypertension 09/23/2012  . Obesity 09/23/2012  . Dysfunctional uterine bleeding 09/12/2012    Past Medical History:  Diagnosis Date  . Asthma exacerbation 10/22/2017  . Bartholin gland cyst   . Fluid excess   . Hypertension   . Menorrhagia   . SVD (spontaneous vaginal delivery)    x 6    Past Surgical History:  Procedure Laterality Date  . BILATERAL SALPINGECTOMY Bilateral 07/19/2015   Procedure: BILATERAL SALPINGECTOMY;  Surgeon: Lavonia Drafts, MD;  Location: [facility];  Service: Gynecology;  Laterality: Bilateral;  . BREAST REDUCTION SURGERY  04/2003  . BUNIONECTOMY  06/17/12   lt  . CARPAL TUNNEL RELEASE Left 06/25/2014   Procedure: LEFT CARPAL TUNNEL RELEASE;  Surgeon: Daryll Brod, MD;  Location: [facility];  Service: Orthopedics;  Laterality: Left;  . DILATATION & CURETTAGE/HYSTEROSCOPY WITH MYOSURE N/A 05/10/2015   Procedure: DILATATION & CURETTAGE/HYSTEROSCOPY WITH MYOSURE;  Surgeon:  Lavonia Drafts, MD;  Location: [facility];  Service: Gynecology;  Laterality: N/A;  . HYSTEROSCOPY WITH NOVASURE N/A 03/01/2014   Procedure: HYSTEROSCOPY WITH NOVASURE;  Surgeon: Lavonia Drafts, MD;  Location: [facility];  Service: Gynecology;  Laterality: N/A;  . OOPHORECTOMY Right 07/19/2015   Procedure: PARTIAL OOPHORECTOMY;  Surgeon: Lavonia Drafts, MD;  Location: [facility];  Service: Gynecology;  Laterality: Right;  . REDUCTION MAMMAPLASTY    . VAGINAL HYSTERECTOMY N/A 07/19/2015   Procedure: HYSTERECTOMY VAGINAL;  Surgeon: Lavonia Drafts, MD;  Location: [facility];  Service: Gynecology;  Laterality: N/A;  . WISDOM TOOTH EXTRACTION      Social History   Tobacco Use  . Smoking status: Never Smoker  . Smokeless tobacco: Never Used  Substance Use Topics  . Alcohol use: No  . Drug use: No    Family History  Problem Relation Age of Onset  . Heart disease Father   . Stroke Father   . Diabetes Son   . Cancer Neg Hx     No Known Allergies  Medication list has been reviewed and updated.  Current Outpatient Medications on File Prior to Visit  Medication Sig Dispense Refill  . albuterol (PROVENTIL) (2.5 MG/3ML) 0.083% nebulizer solution Take 3 mLs (2.5 mg total) by nebulization every 6 (six) hours as needed for wheezing or shortness of breath. 75 mL 12  . budesonide (PULMICORT) 0.25 MG/2ML nebulizer solution Take 2 mLs (0.25 mg total)  by nebulization 2 (two) times daily. 60 mL 12  . fluconazole (DIFLUCAN) 100 MG tablet Take 1 tablet (100 mg total) by mouth daily. (Patient not taking: Reported on 05/20/2019) 1 tablet 0  . furosemide (LASIX) 20 MG tablet TAKE 1 TABLET BY MOUTH DAILY AS NEEDED FOR LEG SWELLING (Patient not taking: Reported on 05/20/2019) 15 tablet 0  . linaclotide (LINZESS) 72 MCG capsule Take 1 capsule (72 mcg total) by mouth daily before breakfast. (Patient not taking: Reported on 05/20/2019) 30 capsule 6  . phentermine 15 MG capsule Take 1 capsule (15 mg  total) by mouth every morning. (Patient not taking: Reported on 05/20/2019) 30 capsule 1  . rizatriptan (MAXALT) 10 MG tablet Take 1 tablet (10 mg total) by mouth as needed for migraine. May repeat in 2 hours if needed 10 tablet 0   No current facility-administered medications on file prior to visit.     Review of Systems:  As per HPI- otherwise negative.   Physical Examination: There were no vitals filed for this visit. There were no vitals filed for this visit. There is no height or weight on file to calculate BMI. Ideal Body Weight:    Video visit   Assessment and Plan: ***  Signed Lamar Blinks, MD

## 2019-06-01 ENCOUNTER — Other Ambulatory Visit: Payer: Self-pay

## 2019-06-01 ENCOUNTER — Ambulatory Visit: Payer: Commercial Managed Care - PPO | Admitting: Family Medicine

## 2019-06-01 DIAGNOSIS — Z0289 Encounter for other administrative examinations: Secondary | ICD-10-CM

## 2019-06-01 NOTE — Progress Notes (Signed)
[place] at [facility] [address] [place], [place] [address] [phone]  Date:  06/03/2019   Name:  Lori Green   DOB:  03-17-1966   MRN:  [id number]  PCP:  Darreld Mclean, MD    Chief Complaint: No chief complaint on file.   History of Present Illness:  Lori Green is a 54 y.o. very pleasant female patient who presents with the following:  Pt with history of HTN Virtual visit today to discuss labs  Patient location is home, provider location is office.  She gives consent for virtual visit today I have seen her once in the past about 2 years ago  Her GYN is Dr. Sheldon Silvan- she was recently noted to have an elevated A1c and low vit D at GYN appt This was her first A1c check Lab Results  Component Value Date   HGBA1C 6.7 (H) 05/22/2019   Vit D low at 8.6 Her thyroid, CBC, C met were normal  I called twice at the time of our appt, no answer.  I don't have a cell number for her- we seem to just have her work number listed.  I left a VM for her   Patient Active Problem List   Diagnosis Date Noted  . Asthma exacerbation 10/22/2017  . Migraine without aura and without status migrainosus, not intractable 04/29/2017  . Hemorrhagic ovarian cyst 07/19/2015  . Postoperative state 07/19/2015  . Hypertension 09/23/2012  . Obesity 09/23/2012  . Dysfunctional uterine bleeding 09/12/2012    Past Medical History:  Diagnosis Date  . Asthma exacerbation 10/22/2017  . Bartholin gland cyst   . Fluid excess   . Hypertension   . Menorrhagia   . SVD (spontaneous vaginal delivery)    x 6    Past Surgical History:  Procedure Laterality Date  . BILATERAL SALPINGECTOMY Bilateral 07/19/2015   Procedure: BILATERAL SALPINGECTOMY;  Surgeon: Lavonia Drafts, MD;  Location: [facility];  Service: Gynecology;  Laterality: Bilateral;  . BREAST REDUCTION SURGERY  04/2003  . BUNIONECTOMY  06/17/12   lt  . CARPAL TUNNEL RELEASE Left  06/25/2014   Procedure: LEFT CARPAL TUNNEL RELEASE;  Surgeon: Daryll Brod, MD;  Location: [place];  Service: Orthopedics;  Laterality: Left;  . DILATATION & CURETTAGE/HYSTEROSCOPY WITH MYOSURE N/A 05/10/2015   Procedure: DILATATION & CURETTAGE/HYSTEROSCOPY WITH MYOSURE;  Surgeon: Lavonia Drafts, MD;  Location: [facility];  Service: Gynecology;  Laterality: N/A;  . HYSTEROSCOPY WITH NOVASURE N/A 03/01/2014   Procedure: HYSTEROSCOPY WITH NOVASURE;  Surgeon: Lavonia Drafts, MD;  Location: [facility];  Service: Gynecology;  Laterality: N/A;  . OOPHORECTOMY Right 07/19/2015   Procedure: PARTIAL OOPHORECTOMY;  Surgeon: Lavonia Drafts, MD;  Location: [facility];  Service: Gynecology;  Laterality: Right;  . REDUCTION MAMMAPLASTY    . VAGINAL HYSTERECTOMY N/A 07/19/2015   Procedure: HYSTERECTOMY VAGINAL;  Surgeon: Lavonia Drafts, MD;  Location: [facility];  Service: Gynecology;  Laterality: N/A;  . WISDOM TOOTH EXTRACTION      Social History   Tobacco Use  . Smoking status: Never Smoker  . Smokeless tobacco: Never Used  Substance Use Topics  . Alcohol use: No  . Drug use: No    Family History  Problem Relation Age of Onset  . Heart disease Father   . Stroke Father   . Diabetes Son   . Cancer Neg Hx     No Known Allergies  Medication list has been reviewed and  updated.  Current Outpatient Medications on File Prior to Visit  Medication Sig Dispense Refill  . albuterol (PROVENTIL) (2.5 MG/3ML) 0.083% nebulizer solution Take 3 mLs (2.5 mg total) by nebulization every 6 (six) hours as needed for wheezing or shortness of breath. 75 mL 12  . budesonide (PULMICORT) 0.25 MG/2ML nebulizer solution Take 2 mLs (0.25 mg total) by nebulization 2 (two) times daily. 60 mL 12  . fluconazole (DIFLUCAN) 100 MG tablet Take 1 tablet (100 mg total) by mouth daily. (Patient not taking: Reported on 05/20/2019) 1 tablet 0  . furosemide (LASIX) 20 MG tablet TAKE 1 TABLET BY MOUTH  DAILY AS NEEDED FOR LEG SWELLING (Patient not taking: Reported on 05/20/2019) 15 tablet 0  . linaclotide (LINZESS) 72 MCG capsule Take 1 capsule (72 mcg total) by mouth daily before breakfast. (Patient not taking: Reported on 05/20/2019) 30 capsule 6  . phentermine 15 MG capsule Take 1 capsule (15 mg total) by mouth every morning. (Patient not taking: Reported on 05/20/2019) 30 capsule 1  . rizatriptan (MAXALT) 10 MG tablet Take 1 tablet (10 mg total) by mouth as needed for migraine. May repeat in 2 hours if needed 10 tablet 0   No current facility-administered medications on file prior to visit.     Review of Systems:  As per HPI- otherwise negative.   Physical Examination: There were no vitals filed for this visit. There were no vitals filed for this visit. There is no height or weight on file to calculate BMI. Ideal Body Weight:    Pt did not show  Assessment and Plan: Pt did now show   Signed Lamar Blinks, MD

## 2019-06-03 ENCOUNTER — Encounter: Payer: Self-pay | Admitting: Family Medicine

## 2019-06-03 ENCOUNTER — Ambulatory Visit: Payer: Commercial Managed Care - PPO | Admitting: Family Medicine

## 2019-06-03 ENCOUNTER — Other Ambulatory Visit: Payer: Self-pay

## 2019-07-03 ENCOUNTER — Encounter: Admit: 2019-07-03 | Discharge: 2019-07-03 | Payer: PRIVATE HEALTH INSURANCE | Attending: Family | Primary: Family

## 2019-07-03 ENCOUNTER — Ambulatory Visit: Admit: 2019-07-03 | Discharge: 2019-07-04 | Attending: Family | Primary: Family

## 2019-07-03 DIAGNOSIS — B349 Viral infection, unspecified: Principal | ICD-10-CM

## 2019-07-14 ENCOUNTER — Ambulatory Visit: Admit: 2019-07-14 | Discharge: 2019-07-15 | Payer: PRIVATE HEALTH INSURANCE | Attending: Family | Primary: Family

## 2019-07-14 DIAGNOSIS — M1712 Unilateral primary osteoarthritis, left knee: Secondary | ICD-10-CM

## 2019-07-14 DIAGNOSIS — M1711 Unilateral primary osteoarthritis, right knee: Secondary | ICD-10-CM

## 2019-08-03 ENCOUNTER — Encounter: Payer: Self-pay | Admitting: Family Medicine

## 2019-08-05 ENCOUNTER — Ambulatory Visit (INDEPENDENT_AMBULATORY_CARE_PROVIDER_SITE_OTHER): Payer: Commercial Managed Care - PPO | Admitting: Family Medicine

## 2019-08-05 ENCOUNTER — Encounter: Payer: Self-pay | Admitting: Family Medicine

## 2019-08-05 ENCOUNTER — Other Ambulatory Visit: Payer: Self-pay

## 2019-08-05 VITALS — BP 132/80 | HR 84 | Temp 97.1°F | Resp 16 | Ht 64.0 in | Wt 243.0 lb

## 2019-08-05 DIAGNOSIS — R631 Polydipsia: Secondary | ICD-10-CM

## 2019-08-05 DIAGNOSIS — R7309 Other abnormal glucose: Secondary | ICD-10-CM

## 2019-08-05 DIAGNOSIS — Z1159 Encounter for screening for other viral diseases: Secondary | ICD-10-CM

## 2019-08-05 DIAGNOSIS — R0683 Snoring: Secondary | ICD-10-CM | POA: Diagnosis not present

## 2019-08-05 DIAGNOSIS — R5383 Other fatigue: Secondary | ICD-10-CM | POA: Diagnosis not present

## 2019-08-05 DIAGNOSIS — E119 Type 2 diabetes mellitus without complications: Secondary | ICD-10-CM

## 2019-08-05 LAB — GLUCOSE, POCT (MANUAL RESULT ENTRY): POC Glucose: 127 mg/dl — AB (ref 70–99)

## 2019-08-05 MED ORDER — METFORMIN HCL 500 MG PO TABS: 500.0000 mg | ORAL_TABLET | Freq: Two times a day (BID) | ORAL | 3 refills | Status: DC

## 2019-08-05 NOTE — Patient Instructions (Signed)
It was great to see you today I will be in touch with your labs asap Start with a 1/2 or whole metformin pill in the evening.  We can increase to twice a day in a few weeks as tolerated I am also going to set you up for a sleep study with neurology to evaluate for possible sleep apnea

## 2019-08-05 NOTE — Progress Notes (Addendum)
[place] at [facility] [address] [place], [place] [address] [phone]  Date:  08/05/2019   Name:  Lori Green   DOB:  1966/02/12   MRN:  [id number]  PCP:  Darreld Mclean, MD    Chief Complaint: Thirst increase and Fatigue (couple of months)   History of Present Illness:  Lori Green is a 53 y.o. very pleasant female patient who presents with the following:  Patient with history of hypertension, obesity, migraine headache.  Virtual visit today to discuss not feeling well- concern about diabetes  She was a no-show for planned visit in July due to mixup with her new phone number, I have actually not seen her since June 2018  Her GYN Dr. Ihor Dow did labs for her July, at that time A1c was elevated at 6.7 Her thyroid was normal at that time Lab Results  Component Value Date   HGBA1C 6.7 (H) 05/22/2019   Lab Results  Component Value Date   TSH 2.340 05/22/2019    She notes that she has felt tired and thirsty, and she is urinating more frequently than normal She has noted this for a couple of months  She is more hungry than usual and still feels hungry after she eats  She sometimes has a hard time sleeping Her partner has noted severe snoring She may wake up with a HA She is sometimes sleepy and somnolent   Never had a sleep test -will be willing to have a sleep apnea evaluation She is not losing weight, she is actually gaining   Wt Readings from Last 3 Encounters:  08/05/19 243 lb (110.2 kg)  05/20/19 240 lb (108.9 kg)  02/14/18 226 lb (102.5 kg)     Patient Active Problem List   Diagnosis Date Noted  . Asthma exacerbation 10/22/2017  . Migraine without aura and without status migrainosus, not intractable 04/29/2017  . Hemorrhagic ovarian cyst 07/19/2015  . Postoperative state 07/19/2015  . Hypertension 09/23/2012  . Obesity 09/23/2012  . Dysfunctional uterine bleeding 09/12/2012     Past Medical History:  Diagnosis Date  . Asthma exacerbation 10/22/2017  . Bartholin gland cyst   . Fluid excess   . Hypertension   . Menorrhagia   . SVD (spontaneous vaginal delivery)    x 6    Past Surgical History:  Procedure Laterality Date  . BILATERAL SALPINGECTOMY Bilateral 07/19/2015   Procedure: BILATERAL SALPINGECTOMY;  Surgeon: Lavonia Drafts, MD;  Location: [facility];  Service: Gynecology;  Laterality: Bilateral;  . BREAST REDUCTION SURGERY  04/2003  . BUNIONECTOMY  06/17/12   lt  . CARPAL TUNNEL RELEASE Left 06/25/2014   Procedure: LEFT CARPAL TUNNEL RELEASE;  Surgeon: Daryll Brod, MD;  Location: [place];  Service: Orthopedics;  Laterality: Left;  . DILATATION & CURETTAGE/HYSTEROSCOPY WITH MYOSURE N/A 05/10/2015   Procedure: DILATATION & CURETTAGE/HYSTEROSCOPY WITH MYOSURE;  Surgeon: Lavonia Drafts, MD;  Location: [facility];  Service: Gynecology;  Laterality: N/A;  . HYSTEROSCOPY WITH NOVASURE N/A 03/01/2014   Procedure: HYSTEROSCOPY WITH NOVASURE;  Surgeon: Lavonia Drafts, MD;  Location: [facility];  Service: Gynecology;  Laterality: N/A;  . OOPHORECTOMY Right 07/19/2015   Procedure: PARTIAL OOPHORECTOMY;  Surgeon: Lavonia Drafts, MD;  Location: [facility];  Service: Gynecology;  Laterality: Right;  . REDUCTION MAMMAPLASTY    . VAGINAL HYSTERECTOMY N/A 07/19/2015   Procedure: HYSTERECTOMY VAGINAL;  Surgeon: Lavonia Drafts, MD;  Location: [facility];  Service: Gynecology;  Laterality: N/A;  . WISDOM TOOTH EXTRACTION      Social History   Tobacco Use  . Smoking status: Never Smoker  . Smokeless tobacco: Never Used  Substance Use Topics  . Alcohol use: No  . Drug use: No    Family History  Problem Relation Age of Onset  . Heart disease Father   . Stroke Father   . Diabetes Son   . Cancer Neg Hx     No Known Allergies  Medication list has been reviewed and updated.  Current Outpatient Medications on File Prior to  Visit  Medication Sig Dispense Refill  . albuterol (PROVENTIL) (2.5 MG/3ML) 0.083% nebulizer solution Take 3 mLs (2.5 mg total) by nebulization every 6 (six) hours as needed for wheezing or shortness of breath. 75 mL 12  . budesonide (PULMICORT) 0.25 MG/2ML nebulizer solution Take 2 mLs (0.25 mg total) by nebulization 2 (two) times daily. 60 mL 12  . rizatriptan (MAXALT) 10 MG tablet Take 1 tablet (10 mg total) by mouth as needed for migraine. May repeat in 2 hours if needed 10 tablet 0   No current facility-administered medications on file prior to visit.     Review of Systems:  As per HPI- otherwise negative.' No fever or chills, no chest pain or shortness of breath  Physical Examination: Vitals:   08/05/19 1625  BP: 132/80  Pulse: 84  Resp: 16  Temp: (!) 97.1 F (36.2 C)  SpO2: 98%   Vitals:   08/05/19 1625  Weight: 243 lb (110.2 kg)  Height: 5\' 4"  (1.626 m)   Body mass index is 41.71 kg/m. Ideal Body Weight: Weight in (lb) to have BMI = 25: 145.3  GEN: WDWN, NAD, Non-toxic, A & O x 3, obese, looks well HEENT: Atraumatic, Normocephalic. Neck supple. No masses, No LAD.  Bilateral TM wnl, oropharynx normal.  PEERL,EOMI.   She has moderately large tonsils for age Ears and Nose: No external deformity. CV: RRR, No M/G/R. No JVD. No thrill. No extra heart sounds. PULM: CTA B, no wheezes, crackles, rhonchi. No retractions. No resp. distress. No accessory muscle use. ABD: S, NT, ND, +BS. No rebound. No HSM. EXTR: No c/c/e NEURO Normal gait.  PSYCH: Normally interactive. Conversant. Not depressed or anxious appearing.  Calm demeanor.   Results for orders placed or performed in visit on 08/05/19  POCT Glucose (CBG)  Result Value Ref Range   POC Glucose 127 (A) 70 - 99 mg/dl    Assessment and Plan: Encounter for hepatitis C screening test for low risk patient - Plan: Hepatitis C antibody  Elevated hemoglobin A1c - Plan: Basic metabolic panel, Hemoglobin A1c, metFORMIN  (GLUCOPHAGE) 500 MG tablet  Fatigue, unspecified type - Plan: CBC  Increased thirst - Plan: POCT Glucose (CBG)  Snoring - Plan: Ambulatory referral to Neurology  Here today for a follow-up visit.  Patient was concerned about diabetes, given her increased thirst and urination as well as fatigue Most recent A1c was mildly elevated, in the diabetes range.  We will repeat today to confirm diagnosis diabetes.  However, given her reasonable random glucose, I doubt that she has severely uncontrolled diabetes.  We will have her start on metformin 250 to 500 mg once a day as tolerated.  Await A1c will be back in touch with her Other routine labs as above Referral to neurology to evaluate for sleep apnea  Signed Lamar Blinks, MD  Received her labs 10/2-message to patient  Results for orders placed or performed  in visit on 08/05/19  CBC  Result Value Ref Range   WBC 7.6 4.0 - 10.5 K/uL   RBC 4.29 3.87 - 5.11 Mil/uL   Platelets 302.0 150.0 - 400.0 K/uL   Hemoglobin 12.2 12.0 - 15.0 g/dL   HCT 37.4 36.0 - 46.0 %   MCV 87.1 78.0 - 100.0 fl   MCHC 32.7 30.0 - 36.0 g/dL   RDW 14.0 11.5 - [id number] %  Basic metabolic panel  Result Value Ref Range   Sodium 137 135 - 145 mEq/L   Potassium 4.2 3.5 - 5.1 mEq/L   Chloride 100 96 - 112 mEq/L   CO2 31 19 - 32 mEq/L   Glucose, Bld 119 (H) 70 - 99 mg/dL   BUN 17 6 - 23 mg/dL   Creatinine, Ser 0.87 0.40 - 1.20 mg/dL   Calcium 9.7 8.4 - 10.5 mg/dL   GFR 81.59 >60.00 mL/min  Hemoglobin A1c  Result Value Ref Range   Hgb A1c MFr Bld 7.2 (H) 4.6 - 6.5 %  Hepatitis C antibody  Result Value Ref Range   Hepatitis C Ab NON-REACTIVE NON-REACTI   SIGNAL TO CUT-OFF 0.02 <1.00  POCT Glucose (CBG)  Result Value Ref Range   POC Glucose 127 (A) 70 - 99 mg/dl    Blood counts are normal Metabolic profile is normal Hepatitis C screen is negative, as expected  Your A1c-average blood sugar the previous 3 months-is consistent with diabetes.  Your A1c is  slightly higher than we would like for glucose control.  How are you doing with the metformin?  With this A1c number, I would like to have you taking 500 mg of metformin twice a day possible  Working on increased exercise, losing a few pounds, and eating a lower carbohydrate diet will also help control your diabetes  Please let me know how the metformin is going, let us visit in about 3 months for a follow-up

## 2019-08-06 LAB — BASIC METABOLIC PANEL
BUN: 17 mg/dL (ref 6–23)
CO2: 31 mEq/L (ref 19–32)
Calcium: 9.7 mg/dL (ref 8.4–10.5)
Chloride: 100 mEq/L (ref 96–112)
Creatinine, Ser: 0.87 mg/dL (ref 0.40–1.20)
GFR: 81.59 mL/min (ref >60.00)
Glucose, Bld: 119 mg/dL — ABNORMAL HIGH (ref 70–99)
Potassium: 4.2 mEq/L (ref 3.5–5.1)
Sodium: 137 mEq/L (ref 135–145)

## 2019-08-06 LAB — CBC
HCT: 37.4 % (ref 36.0–46.0)
Hemoglobin: 12.2 g/dL (ref 12.0–15.0)
MCHC: 32.7 g/dL (ref 30.0–36.0)
MCV: 87.1 fl (ref 78.0–100.0)
Platelets: 302 10*3/uL (ref 150.0–400.0)
RBC: 4.29 Mil/uL (ref 3.87–5.11)
RDW: 14 % (ref 11.5–15.5)
WBC: 7.6 10*3/uL (ref 4.0–10.5)

## 2019-08-06 LAB — HEMOGLOBIN A1C: Hgb A1c MFr Bld: 7.2 % — ABNORMAL HIGH (ref 4.6–6.5)

## 2019-08-06 LAB — HEPATITIS C ANTIBODY
Hepatitis C Ab: NONREACTIVE
SIGNAL TO CUT-OFF: 0.02 (ref <1.00)

## 2019-08-07 ENCOUNTER — Encounter: Payer: Self-pay | Admitting: Family Medicine

## 2019-08-07 DIAGNOSIS — E119 Type 2 diabetes mellitus without complications: Secondary | ICD-10-CM | POA: Insufficient documentation

## 2019-08-13 ENCOUNTER — Encounter: Payer: Self-pay | Admitting: Family Medicine

## 2019-08-17 ENCOUNTER — Institutional Professional Consult (permissible substitution): Payer: Self-pay | Admitting: Neurology

## 2019-08-27 ENCOUNTER — Encounter: Payer: Self-pay | Admitting: Family Medicine

## 2019-08-28 ENCOUNTER — Telehealth: Payer: Self-pay

## 2019-08-28 MED ORDER — PHENTERMINE HCL 15 MG PO CAPS: 15.0000 mg | ORAL_CAPSULE | ORAL | 1 refills | Status: DC

## 2019-08-28 NOTE — Telephone Encounter (Signed)
PA approved. Effective 08/28/2019 to 11/20/2019.

## 2019-08-28 NOTE — Telephone Encounter (Signed)
PA initiated via Covermymeds; [id number]. Awaiting determination.

## 2019-10-12 ENCOUNTER — Telehealth: Payer: Self-pay

## 2019-10-12 ENCOUNTER — Other Ambulatory Visit: Payer: Self-pay | Admitting: Family Medicine

## 2019-10-12 ENCOUNTER — Ambulatory Visit (INDEPENDENT_AMBULATORY_CARE_PROVIDER_SITE_OTHER): Payer: Commercial Managed Care - PPO | Admitting: Family Medicine

## 2019-10-12 ENCOUNTER — Encounter: Payer: Self-pay | Admitting: Family Medicine

## 2019-10-12 DIAGNOSIS — J069 Acute upper respiratory infection, unspecified: Secondary | ICD-10-CM

## 2019-10-12 NOTE — Progress Notes (Signed)
[place] at [facility] [address] [place], [place] [address] [phone]  Date:  10/12/2019   Name:  Lori Green   DOB:  10-04-1966   MRN:  [id number]  PCP:  Darreld Mclean, MD    Chief Complaint: No chief complaint on file.   History of Present Illness:  Lori Green is a 53 y.o. very pleasant female patient who presents with the following:  Virtual visit today for illness Patient with history of controlled diabetes, asthma, hypertension, obesity Last visit with myself in late September Lab Results  Component Value Date   HGBA1C 7.2 (H) 08/05/2019    Patient location is home, provider is at office Patient identity confirmed with 2 factors, she gives consent for virtual visit today The patient and myself were present on her virtual call today  Metformin Pulmicort Albuterol as needed Maxalt as needed Phentermine  She felt ill somewhat beginning 11/17 with cough, low grade fever, diarrhea, body aches Her BF tested positive for covid on 11/27 She was tested herself last week and was negative- test done on 12/2, received negative result on 12/4  At this point she feels generally well She notes a slight cough still but is improving She had her flu shot in September She never had any fever except for the first week of her illness- 99.4 T-max No vomiting or diarrhea now Cough and SOB are much improved from mid November  She has not been checking her glucose but she has not noted any symptoms of hypoglycemia  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 Controlled type 2 diabetes mellitus without complication, without long-term current use of insulin ([place]) 08/07/2019  . Asthma exacerbation 10/22/2017  . Migraine  without aura and without status migrainosus, not intractable 04/29/2017  . Hemorrhagic ovarian cyst 07/19/2015  . Postoperative state 07/19/2015  . Hypertension 09/23/2012  . Obesity 09/23/2012  . Dysfunctional uterine bleeding 09/12/2012    Past Medical History:  Diagnosis Date  . Asthma exacerbation 10/22/2017  . Bartholin gland cyst   . Fluid excess   . Hypertension   . Menorrhagia   . SVD (spontaneous vaginal delivery)    x 6    Past Surgical History:  Procedure Laterality Date  . BILATERAL SALPINGECTOMY Bilateral 07/19/2015   Procedure: BILATERAL SALPINGECTOMY;  Surgeon: Lavonia Drafts, MD;  Location: [facility];  Service: Gynecology;  Laterality: Bilateral;  . BREAST REDUCTION SURGERY  04/2003  . BUNIONECTOMY  06/17/12   lt  . CARPAL TUNNEL RELEASE Left 06/25/2014   Procedure: LEFT CARPAL TUNNEL RELEASE;  Surgeon: Daryll Brod, MD;  Location: [facility];  Service: Orthopedics;  Laterality: Left;  . DILATATION & CURETTAGE/HYSTEROSCOPY WITH MYOSURE N/A 05/10/2015   Procedure: DILATATION & CURETTAGE/HYSTEROSCOPY WITH MYOSURE;  Surgeon: Lavonia Drafts, MD;  Location: [facility];  Service: Gynecology;  Laterality: N/A;  . HYSTEROSCOPY WITH NOVASURE N/A 03/01/2014   Procedure: HYSTEROSCOPY WITH NOVASURE;  Surgeon: Lavonia Drafts, MD;  Location: [facility];  Service: Gynecology;  Laterality: N/A;  . OOPHORECTOMY Right 07/19/2015   Procedure: PARTIAL OOPHORECTOMY;  Surgeon: Lavonia Drafts, MD;  Location: [facility];  Service: Gynecology;  Laterality: Right;  . REDUCTION MAMMAPLASTY    .  VAGINAL HYSTERECTOMY N/A 07/19/2015   Procedure: HYSTERECTOMY VAGINAL;  Surgeon: Lavonia Drafts, MD;  Location: [facility];  Service: Gynecology;  Laterality: N/A;  . WISDOM TOOTH EXTRACTION      Social History   Tobacco Use  . Smoking status: Never Smoker  . Smokeless tobacco: Never Used  Substance Use Topics  . Alcohol use: No  . Drug use: No    Family History   Problem Relation Age of Onset  . Heart disease Father   . Stroke Father   . Diabetes Son   . Cancer Neg Hx     No Known Allergies  Medication list has been reviewed and updated.  Current Outpatient Medications on File Prior to Visit  Medication Sig Dispense Refill  . albuterol (PROVENTIL) (2.5 MG/3ML) 0.083% nebulizer solution Take 3 mLs (2.5 mg total) by nebulization every 6 (six) hours as needed for wheezing or shortness of breath. 75 mL 12  . budesonide (PULMICORT) 0.25 MG/2ML nebulizer solution Take 2 mLs (0.25 mg total) by nebulization 2 (two) times daily. 60 mL 12  . metFORMIN (GLUCOPHAGE) 500 MG tablet Take 1 tablet (500 mg total) by mouth 2 (two) times daily with a meal. 180 tablet 3  . phentermine 15 MG capsule Take 1 capsule (15 mg total) by mouth every morning. 30 capsule 1  . rizatriptan (MAXALT) 10 MG tablet Take 1 tablet (10 mg total) by mouth as needed for migraine. May repeat in 2 hours if needed 10 tablet 0   No current facility-administered medications on file prior to visit.     Review of Systems:  As per HPI- otherwise negative.   Physical Examination: There were no vitals filed for this visit. There were no vitals filed for this visit. There is no height or weight on file to calculate BMI. Ideal Body Weight:    She is checking her temp at home but no other vitals Patient observed over video monitor.  She looks well, no cough, wheezing, shortness of breath is noted  Assessment and Plan: Viral upper respiratory tract infection  Virtual visit today to discuss recent illness Patient got sick about 3 weeks ago, possible symptoms of COVID-19.  However she is now much better, afebrile, had a negative Covid test last week I do think it is reasonable her to return to work at this time, will place a return to work note in her chart Invited and answered all questions She will alert me if not continuing to improve Continue routine COVID-19 precautions such as  mask wearing and social distancing  Signed Lamar Blinks, MD

## 2019-10-12 NOTE — Progress Notes (Deleted)
[place] at [facility] [address] [place], [place] [address] [phone] [phone]  Date:  10/14/2019   Name:  Lori Green   DOB:  1966/06/25   MRN:  [id number]  PCP:  Darreld Mclean, MD    Chief Complaint: No chief complaint on file.   History of Present Illness:  Lori Green is a 53 y.o. very pleasant female patient who presents with the following:  Virtual visit today for concern of illness Patient with history of diabetes, asthma, hypertension, obesity Last visit with myself virtually in September-at that time we had her start on Metformin for elevated A1c Lab Results  Component Value Date   HGBA1C 7.2 (H) 08/05/2019    Patient location is home, provider is at office Patient identity confirmed with 2 factors, she gives consent for virtual visit today.  Eye exam Colon cancer screening Pap Flu vaccine  Patient Active Problem List   Diagnosis Date Noted  . Controlled type 2 diabetes mellitus without complication, without long-term current use of insulin ([place]) 08/07/2019  . Asthma exacerbation 10/22/2017  . Migraine without aura and without status migrainosus, not intractable 04/29/2017  . Hemorrhagic ovarian cyst 07/19/2015  . Postoperative state 07/19/2015  . Hypertension 09/23/2012  . Obesity 09/23/2012  . Dysfunctional uterine bleeding 09/12/2012    Past Medical History:  Diagnosis Date  . Asthma exacerbation 10/22/2017  . Bartholin gland cyst   . Fluid excess   . Hypertension   . Menorrhagia   . SVD (spontaneous vaginal delivery)    x 6    Past Surgical History:  Procedure Laterality Date  . BILATERAL SALPINGECTOMY Bilateral 07/19/2015   Procedure: BILATERAL SALPINGECTOMY;  Surgeon: Lavonia Drafts, MD;  Location: [facility];  Service: Gynecology;  Laterality: Bilateral;  . BREAST REDUCTION SURGERY  04/2003  . BUNIONECTOMY  06/17/12   lt  . CARPAL TUNNEL RELEASE Left 06/25/2014   Procedure:  LEFT CARPAL TUNNEL RELEASE;  Surgeon: Daryll Brod, MD;  Location: [facility];  Service: Orthopedics;  Laterality: Left;  . DILATATION & CURETTAGE/HYSTEROSCOPY WITH MYOSURE N/A 05/10/2015   Procedure: DILATATION & CURETTAGE/HYSTEROSCOPY WITH MYOSURE;  Surgeon: Lavonia Drafts, MD;  Location: [facility];  Service: Gynecology;  Laterality: N/A;  . HYSTEROSCOPY WITH NOVASURE N/A 03/01/2014   Procedure: HYSTEROSCOPY WITH NOVASURE;  Surgeon: Lavonia Drafts, MD;  Location: [facility];  Service: Gynecology;  Laterality: N/A;  . OOPHORECTOMY Right 07/19/2015   Procedure: PARTIAL OOPHORECTOMY;  Surgeon: Lavonia Drafts, MD;  Location: [facility];  Service: Gynecology;  Laterality: Right;  . REDUCTION MAMMAPLASTY    . VAGINAL HYSTERECTOMY N/A 07/19/2015   Procedure: HYSTERECTOMY VAGINAL;  Surgeon: Lavonia Drafts, MD;  Location: [facility];  Service: Gynecology;  Laterality: N/A;  . WISDOM TOOTH EXTRACTION      Social History   Tobacco Use  . Smoking status: Never Smoker  . Smokeless tobacco: Never Used  Substance Use Topics  . Alcohol use: No  . Drug use: No    Family History  Problem Relation Age of Onset  . Heart disease Father   . Stroke Father   . Diabetes Son   . Cancer Neg Hx     No Known Allergies  Medication list has been reviewed and updated.  Current Outpatient Medications on File Prior to Visit  Medication Sig Dispense Refill  . albuterol (PROVENTIL) (2.5 MG/3ML) 0.083% nebulizer solution Take 3 mLs (2.5 mg total) by nebulization every 6 (six) hours as  needed for wheezing or shortness of breath. 75 mL 12  . budesonide (PULMICORT) 0.25 MG/2ML nebulizer solution Take 2 mLs (0.25 mg total) by nebulization 2 (two) times daily. 60 mL 12  . metFORMIN (GLUCOPHAGE) 500 MG tablet Take 1 tablet (500 mg total) by mouth 2 (two) times daily with a meal. 180 tablet 3  . phentermine 15 MG capsule Take 1 capsule (15 mg total) by mouth every morning. 30 capsule 1  .  rizatriptan (MAXALT) 10 MG tablet Take 1 tablet (10 mg total) by mouth as needed for migraine. May repeat in 2 hours if needed 10 tablet 0   No current facility-administered medications on file prior to visit.     Review of Systems:  As per HPI- otherwise negative.   Physical Examination: There were no vitals filed for this visit. There were no vitals filed for this visit. There is no height or weight on file to calculate BMI. Ideal Body Weight:      Assessment and Plan: ***  Signed Lamar Blinks, MD

## 2019-10-12 NOTE — Telephone Encounter (Signed)
Copied from [place] [id number]. Topic: General - Other >> Oct 12, 2019  8:02 AM Carolyn Stare wrote: Pt has a virtual appt on Wednesday but would like a sooner please call

## 2019-10-14 ENCOUNTER — Ambulatory Visit: Payer: Commercial Managed Care - PPO | Admitting: Family Medicine

## 2019-11-12 DIAGNOSIS — M25461 Effusion, right knee: Principal | ICD-10-CM

## 2019-11-12 DIAGNOSIS — M25561 Pain in right knee: Principal | ICD-10-CM

## 2019-11-16 ENCOUNTER — Encounter: Payer: Self-pay | Admitting: Family Medicine

## 2019-11-19 ENCOUNTER — Ambulatory Visit: Admit: 2019-11-19 | Discharge: 2019-11-20 | Payer: PRIVATE HEALTH INSURANCE

## 2019-12-01 ENCOUNTER — Ambulatory Visit (INDEPENDENT_AMBULATORY_CARE_PROVIDER_SITE_OTHER): Payer: Commercial Managed Care - PPO | Admitting: Neurology

## 2019-12-01 ENCOUNTER — Other Ambulatory Visit: Payer: Self-pay

## 2019-12-01 ENCOUNTER — Encounter: Payer: Self-pay | Admitting: Neurology

## 2019-12-01 VITALS — BP 161/94 | HR 79 | Temp 97.5°F | Ht 64.0 in | Wt 239.3 lb

## 2019-12-01 DIAGNOSIS — G4719 Other hypersomnia: Secondary | ICD-10-CM

## 2019-12-01 DIAGNOSIS — R519 Headache, unspecified: Secondary | ICD-10-CM

## 2019-12-01 DIAGNOSIS — R0683 Snoring: Secondary | ICD-10-CM | POA: Diagnosis not present

## 2019-12-01 DIAGNOSIS — R0681 Apnea, not elsewhere classified: Secondary | ICD-10-CM

## 2019-12-01 DIAGNOSIS — R351 Nocturia: Secondary | ICD-10-CM

## 2019-12-01 DIAGNOSIS — R030 Elevated blood-pressure reading, without diagnosis of hypertension: Secondary | ICD-10-CM

## 2019-12-01 DIAGNOSIS — Z6841 Body Mass Index (BMI) 40.0 and over, adult: Secondary | ICD-10-CM

## 2019-12-01 NOTE — Progress Notes (Signed)
Subjective:    [name] is a 54 y.o. female.  HPI     Lori Age, MD, PhD [facility] [address] [place], [place] [address]  Dear Dr. Lorelei Green,  I saw your patient, Lori Green, upon your kind request in my sleep clinic today for initial consultation of her sleep disorder, in particular, concern for underlying obstructive sleep apnea.  The patient is unaccompanied today.  As you know, Lori Green is a 54 year old right-handed woman with an underlying medical history of hypertension, asthma, migraine headaches and morbid obesity with a BMI of over 40, who reports snoring and excessive daytime somnolence.  I reviewed your office note from 08/05/2019.  Her Epworth sleepiness score is 13 out of 24, fatigue severity score is 38 out of 63.  Her fianc has mentioned pauses in her breathing while she is asleep and she has woken herself up from her snoring but also with gasping sensations.  She has had some morning headaches.  She had a paternal aunt with sleep apnea, otherwise no immediate family members with sleep apnea.  She is trying to lose weight, recently started a weight loss medication.  She has 6 grown kids, and several grandchildren.  She lives with her fianc, they do have a TV in the bedroom, she has it on a sleep timer.  She is typically in bed around 10 and rise time is around 5 AM.  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  She lives in [place].  She is a non-smoker and does not currently utilize any alcohol and drinks caffeine during the workweek, 1 cup of coffee per day on average.  She has nocturia about once or twice per average night.  She reports lack of energy.  She noticed daytime tiredness since she had her hysterectomy in 2016.  Her Past Medical History Is Significant For: Past Medical History:  Diagnosis Date  . Asthma exacerbation 10/22/2017  . Bartholin gland cyst   . Fluid  excess   . Hypertension   . Menorrhagia   . SVD (spontaneous vaginal delivery)    x 6    Her Past Surgical History Is Significant For: Past Surgical History:  Procedure Laterality Date  . BILATERAL SALPINGECTOMY Bilateral 07/19/2015   Procedure: BILATERAL SALPINGECTOMY;  Surgeon: Lavonia Drafts, MD;  Location: [facility];  Service: Gynecology;  Laterality: Bilateral;  . BREAST REDUCTION SURGERY  04/2003  . BUNIONECTOMY  06/17/12   lt  . CARPAL TUNNEL RELEASE Left 06/25/2014   Procedure: LEFT CARPAL TUNNEL RELEASE;  Surgeon: Daryll Brod, MD;  Location: [facility];  Service: Orthopedics;  Laterality: Left;  . DILATATION & CURETTAGE/HYSTEROSCOPY WITH MYOSURE N/A 05/10/2015   Procedure: DILATATION & CURETTAGE/HYSTEROSCOPY WITH MYOSURE;  Surgeon: Lavonia Drafts, MD;  Location: [facility];  Service: Gynecology;  Laterality: N/A;  . HYSTEROSCOPY WITH NOVASURE N/A 03/01/2014   Procedure: HYSTEROSCOPY WITH NOVASURE;  Surgeon: Lavonia Drafts, MD;  Location: [facility];  Service: Gynecology;  Laterality: N/A;  . OOPHORECTOMY Right 07/19/2015   Procedure: PARTIAL OOPHORECTOMY;  Surgeon: Lavonia Drafts, MD;  Location: [facility];  Service: Gynecology;  Laterality: Right;  . REDUCTION MAMMAPLASTY    . VAGINAL HYSTERECTOMY N/A 07/19/2015   Procedure: HYSTERECTOMY VAGINAL;  Surgeon: Lavonia Drafts, MD;  Location: [facility];  Service: Gynecology;  Laterality: N/A;  . WISDOM TOOTH EXTRACTION      Her Family History Is Significant For: Family History  Problem Relation Green of Onset  . Heart disease Father   . Stroke Father   . Diabetes Son   . Cancer Neg Hx     Her Social History Is Significant For: Social History   Socioeconomic History  . Marital status: Single    Spouse name: Not on file  . Number of children: 6  . Years of education: Not on file  . Highest education level: Not on file  Occupational History  . Not on file  Tobacco Use  . Smoking status:  Never Smoker  . Smokeless tobacco: Never Used  Substance and Sexual Activity  . Alcohol use: No  . Drug use: No  . Sexual activity: Yes    Birth control/protection: Surgical  Other Topics Concern  . Not on file  Social History Narrative  . Not on file   Social Determinants of Health   Financial Resource Strain:   . Difficulty of Paying Living Expenses: Not on file  Food Insecurity:   . Worried About Charity fundraiser in the Last Year: Not on file  . Ran Out of Food in the Last Year: Not on file  Transportation Needs:   . Lack of Transportation (Medical): Not on file  . Lack of Transportation (Non-Medical): Not on file  Physical Activity:   . Days of Exercise per Week: Not on file  . Minutes of Exercise per Session: Not on file  Stress:   . Feeling of Stress : Not on file  Social Connections:   . Frequency of Communication with Friends and Family: Not on file  . Frequency of Social Gatherings with Friends and Family: Not on file  . Attends Religious Services: Not on file  . Active Member of Clubs or Organizations: Not on file  . Attends Archivist Meetings: Not on file  . Marital Status: Not on file    Her Allergies Are:  No Known Allergies:   Her Current Medications Are:  Outpatient Encounter Medications as of 12/01/2019  Medication Sig  . albuterol (PROVENTIL) (2.5 MG/3ML) 0.083% nebulizer solution Take 3 mLs (2.5 mg total) by nebulization every 6 (six) hours as needed for wheezing or shortness of breath.  . budesonide (PULMICORT) 0.25 MG/2ML nebulizer solution Take 2 mLs (0.25 mg total) by nebulization 2 (two) times daily.  . metFORMIN (GLUCOPHAGE) 500 MG tablet Take 1 tablet (500 mg total) by mouth 2 (two) times daily with a meal.  . phentermine 15 MG capsule Take 1 capsule (15 mg total) by mouth every morning.  . rizatriptan (MAXALT) 10 MG tablet Take 1 tablet (10 mg total) by mouth as needed for migraine. May repeat in 2 hours if needed   No  facility-administered encounter medications on file as of 12/01/2019.  :  Review of Systems:  Out of a complete 14 point review of systems, all are reviewed and negative with the exception of these symptoms as listed below: Review of Systems  Neurological:       Sleep Consult- No prior sleep study.   Epworth Sleepiness Scale 0= would never doze 1= slight chance of dozing 2= moderate chance of dozing 3= high chance of dozing  Sitting and reading:2 Watching TV:2 Sitting inactive in a public place (ex. Theater or meeting)2: As a passenger in a car for an hour without a break:3 Lying down to rest in the afternoon:3 Sitting and talking to someone:0 Sitting quietly after lunch (no alcohol):1 In a car, while stopped in traffic:0 Total:13  Objective:  Neurological Exam  Physical Exam Physical Examination:   Vitals:   12/01/19 1626  BP: (!) 161/94  Pulse: 79  Temp: (!) 97.5 F (36.4 C)    General Examination: The patient is a very pleasant 54 y.o. female in no acute distress. She appears well-developed and well-nourished and well groomed.   HEENT: Normocephalic, atraumatic, pupils are equal, round and reactive to light, extraocular tracking is good without limitation to gaze excursion or nystagmus noted. Hearing is grossly intact. Face is symmetric with normal facial animation. Speech is clear with no dysarthria noted. There is no hypophonia. There is no lip, neck/head, jaw or voice tremor. Neck is supple with full range of passive and active motion. There are no carotid bruits on auscultation. Oropharynx exam reveals: moderate mouth dryness, adequate dental hygiene and moderate airway crowding, due to Tonsillar size of 1-2+, slightly wider uvula and wider tongue noted, Mallampati is class II, neck circumference is 15 and three-quarter inches.  She has a minimal overbite.  Tongue protrudes centrally in palate elevates symmetrically.  Chest: Clear to auscultation without wheezing,  rhonchi or crackles noted.  Heart: S1+S2+0, regular and normal without murmurs, rubs or gallops noted.   Abdomen: Soft, non-tender and non-distended with normal bowel sounds appreciated on auscultation.  Extremities: There is no pitting edema in the distal lower extremities bilaterally.   Skin: Warm and dry without trophic changes noted.   Musculoskeletal: exam reveals right knee discomfort.   Neurologically:  Mental status: The patient is awake, alert and oriented in all 4 spheres. Her immediate and remote memory, attention, language skills and fund of knowledge are appropriate. There is no evidence of aphasia, agnosia, apraxia or anomia. Speech is clear with normal prosody and enunciation. Thought process is linear. Mood is normal and affect is normal.  Cranial nerves II - XII are as described above under HEENT exam.  Motor exam: Normal bulk, strength and tone is noted. There is no tremor, Romberg is negative.  Fine motor skills and coordination: grossly intact.  Cerebellar testing: No dysmetria or intention tremor. There is no truncal or gait ataxia.  Sensory exam: intact to light touch in the upper and lower extremities.  Gait, station and balance: She stands easily. No veering to one side is noted. No leaning to one side is noted. Posture is Green-appropriate and stance is narrow based. Gait shows normal stride length and normal pace. No problems turning are noted. Tandem walk is unremarkable.                Assessment and Plan:  In summary, Lori P Pangburn is a very pleasant 54 y.o.-year old female with an underlying medical history of hypertension, asthma, migraine headaches and morbid obesity with a BMI of over 40, whose history and physical exam are concerning for obstructive sleep apnea (OSA). I had a long chat with the patient about my findings and the diagnosis of OSA, its prognosis and treatment options. We talked about medical treatments, surgical interventions and  non-pharmacological approaches. I explained in particular the risks and ramifications of untreated moderate to severe OSA, especially with respect to developing cardiovascular disease down the Road, including congestive heart failure, difficult to treat hypertension, cardiac arrhythmias, or stroke. Even type 2 diabetes has, in part, been linked to untreated OSA. Symptoms of untreated OSA include daytime sleepiness, memory problems, mood irritability and mood disorder such as depression and anxiety, lack of energy, as well as recurrent headaches, especially morning headaches. We talked about trying  to maintain a healthy lifestyle in general, as well as the importance of weight control. We also talked about the importance of good sleep hygiene. I recommended the following at this time: sleep study.  I explained the sleep test procedure to the patient and also outlined possible surgical and non-surgical treatment options of OSA, including the use of a custom-made dental device (which would require a referral to a specialist dentist or oral surgeon), upper airway surgical options, such as traditional UPPP or a novel less invasive surgical option in the form of Inspire hypoglossal nerve stimulation (which would involve a referral to an ENT surgeon). I also explained the CPAP treatment option to the patient, who indicated that she would be willing to try CPAP if the need arises. I explained the importance of being compliant with PAP treatment, not only for insurance purposes but primarily to improve Her symptoms, and for the patient's long term health benefit, including to reduce Her cardiovascular risks. I answered all her questions today and the patient was in agreement. I plan to see her back after the sleep study is completed and encouraged her to call with any interim questions, concerns, problems or updates.   Thank you very much for allowing me to participate in the care of this nice patient. If I can be of  any further assistance to you please do not hesitate to call me at [phone].  Sincerely,   Lori Age, MD, PhD

## 2019-12-01 NOTE — Patient Instructions (Signed)

## 2019-12-04 MED ORDER — IBUPROFEN 800 MG TABLET
ORAL_TABLET | 2 refills | 0 days | Status: CP
Start: 2019-12-04 — End: ?

## 2019-12-29 ENCOUNTER — Encounter: Payer: Self-pay | Admitting: Family Medicine

## 2019-12-29 ENCOUNTER — Telehealth: Payer: Self-pay

## 2019-12-29 MED ORDER — HYDROXYZINE HCL 10 MG PO TABS: 10.0000 mg | ORAL_TABLET | Freq: Three times a day (TID) | ORAL | 0 refills | Status: DC | PRN

## 2020-01-01 ENCOUNTER — Ambulatory Visit (INDEPENDENT_AMBULATORY_CARE_PROVIDER_SITE_OTHER): Payer: Commercial Managed Care - PPO | Admitting: Neurology

## 2020-01-01 ENCOUNTER — Other Ambulatory Visit: Payer: Self-pay

## 2020-01-01 DIAGNOSIS — R0681 Apnea, not elsewhere classified: Secondary | ICD-10-CM

## 2020-01-01 DIAGNOSIS — Z6841 Body Mass Index (BMI) 40.0 and over, adult: Secondary | ICD-10-CM

## 2020-01-01 DIAGNOSIS — R0683 Snoring: Secondary | ICD-10-CM

## 2020-01-01 DIAGNOSIS — R519 Headache, unspecified: Secondary | ICD-10-CM

## 2020-01-01 DIAGNOSIS — R351 Nocturia: Secondary | ICD-10-CM

## 2020-01-01 DIAGNOSIS — R030 Elevated blood-pressure reading, without diagnosis of hypertension: Secondary | ICD-10-CM

## 2020-01-01 DIAGNOSIS — G4733 Obstructive sleep apnea (adult) (pediatric): Secondary | ICD-10-CM

## 2020-01-01 DIAGNOSIS — G4719 Other hypersomnia: Secondary | ICD-10-CM

## 2020-01-12 ENCOUNTER — Telehealth: Payer: Self-pay

## 2020-01-12 NOTE — Procedures (Signed)
PATIENT'S NAME:  Lori, Green DOB:      [birth year]      MR#:    [id number]     DATE OF RECORDING: 01/01/2020 REFERRING M.D.:  Lamar Blinks MD Study Performed:   Baseline Polysomnogram HISTORY: 54 year old woman with a history of hypertension, asthma, migraine headaches and morbid obesity with a BMI of over 40, who reports snoring and excessive daytime somnolence. She has had some morning headaches. The patient endorsed the Epworth Sleepiness Scale at 13 points. The patient's weight 239 pounds with a height of 64 (inches), resulting in a BMI of 40.6 kg/m2. The patient's neck circumference measured 15.8 inches.  CURRENT MEDICATIONS: Proventil, Pulmicort, Glucophage, Phentermine, Maxalt   PROCEDURE:  This is a multichannel digital polysomnogram utilizing the Somnostar 11.2 system.  Electrodes and sensors were applied and monitored per AASM Specifications.   EEG, EOG, Chin and Limb EMG, were sampled at 200 Hz.  ECG, Snore and Nasal Pressure, Thermal Airflow, Respiratory Effort, CPAP Flow and Pressure, Oximetry was sampled at 50 Hz. Digital video and audio were recorded.      BASELINE STUDY  Lights Out was at 21:50 and Lights On at 04:02.  Total recording time (TRT) was 373 minutes, with a total sleep time (TST) of 325 minutes.   The patient's sleep latency to persistent sleep was 36 minutes. REM latency was 75 minutes, which is normal. The sleep efficiency was 87.1 %.     SLEEP ARCHITECTURE: WASO (Wake after sleep onset) was 30 minutes with mild sleep fragmentation noted. There were 14 minutes in Stage N1, 202 minutes Stage N2, 41.5 minutes Stage N3 and 67.5 minutes in Stage REM.  The percentage of Stage N1 was 4.3%, Stage N2 was 62.2%, which is increased, Stage N3 was 12.8% and Stage R (REM sleep) was 20.8%, which is normal. The arousals were noted as: 24 were spontaneous, 0 were associated with PLMs, 10 were associated with respiratory events.  RESPIRATORY ANALYSIS:  There were a total of 49  respiratory events:  0 obstructive apneas, 0 central apneas and 0 mixed apneas with a total of 0 apneas and an apnea index (AI) of 0 /hour. There were 49 hypopneas with a hypopnea index of 9. /hour. The patient also had 0 respiratory event related arousals (RERAs).      The total APNEA/HYPOPNEA INDEX (AHI) was 9./hour and the total RESPIRATORY DISTURBANCE INDEX was  9. /hour.  27 events occurred in REM sleep and 44 events in NREM. The REM AHI was  24 /hour, versus a non-REM AHI of 5.1. The patient spent 55 minutes of total sleep time in the supine position and 270 minutes in non-supine.. The supine AHI was 18.5 versus a non-supine AHI of 7.1.  OXYGEN SATURATION & C02:  The Wake baseline 02 saturation was 94%, with the lowest being 81%. Time spent below 89% saturation equaled 3 minutes.  PERIODIC LIMB MOVEMENTS: The patient had a total of 6 Periodic Limb Movements.  The Periodic Limb Movement (PLM) index was 1.1 and the PLM Arousal index was 0/hour.  Audio and video analysis did not show any abnormal or unusual movements, behaviors, phonations or vocalizations. The patient took no bathroom breaks. Mild snoring was noted. The EKG was in keeping with normal sinus rhythm (NSR).  Post-study, the patient indicated that sleep was better than usual.   IMPRESSION:  1. Obstructive Sleep Apnea (OSA)  RECOMMENDATIONS:  1. This study demonstrates overall mild obstructive sleep apnea, moderate in REM sleep and in the supine  position, with a total AHI of 9/hour, REM AHI of 24/hour, supine AHI of 18.5/hour and O2 nadir of 81%. Given the patient's medical history and sleep related complaints, treatment with positive airway pressure is recommended; this can be achieved in the form of autoPAP. Alternatively, a full-night CPAP titration study would allow optimization of therapy if needed. Other treatment options may include avoidance of supine sleep position along with weight loss, upper airway or jaw surgery in  selected patients or the use of an oral appliance in certain patients. ENT evaluation and/or consultation with a maxillofacial surgeon or dentist may be feasible in some instances.    2. Please note that untreated obstructive sleep apnea may carries additional perioperative morbidity. Patients with significant obstructive sleep apnea should receive perioperative PAP therapy and the surgeons and particularly the anesthesiologist should be informed of the diagnosis and the severity of the sleep disordered breathing. 3. The patient should be cautioned not to drive, work at heights, or operate dangerous or heavy equipment when tired or sleepy. Review and reiteration of good sleep hygiene measures should be pursued with any patient. 4. The patient will be seen in follow-up by Dr. Rexene Alberts at [facility] for discussion of the test results and further management strategies. The referring provider will be notified of the test results.  I certify that I have reviewed the entire raw data recording prior to the issuance of this report in accordance with the Standards of Accreditation of the American Academy of Sleep Medicine (AASM)  Star Age, MD, PhD Diplomat, American Board of Neurology and Sleep Medicine (Neurology and Sleep Medicine)

## 2020-01-12 NOTE — Addendum Note (Signed)
Addended by: Star Age on: 01/12/2020 08:08 AM   Modules accepted: Orders

## 2020-01-12 NOTE — Progress Notes (Signed)
Patient referred by Dr. Lorelei Pont, seen by me on 12/01/19, diagnostic PSG on 01/01/20.    Please call and notify the patient that the recent sleep study showed obstructive sleep apnea. OSA is overall mild, moderate in REM sleep and during supine sleep and worth treating to see if she feels better after treatment. To that end I recommend treatment for this in the form of autoPAP, which means, that we don't have to bring her back for a second sleep study with CPAP, but will let him try an autoPAP machine at home, through a DME company (of her choice, or as per insurance requirement). The DME representative will educate her on how to use the machine, how to put the mask on, etc. I have placed an order in the chart. Please send referral, talk to patient, send report to referring MD. We will need a FU in sleep clinic for 10 weeks post-PAP set up, please arrange that with me or one of our NPs. Thanks,   Star Age, MD, PhD [facility])

## 2020-01-12 NOTE — Telephone Encounter (Signed)
-----   Message from Star Age, MD sent at 01/12/2020  8:08 AM EST ----- Patient referred by Dr. Lorelei Pont, seen by me on 12/01/19, diagnostic PSG on 01/01/20.    Please call and notify the patient that the recent sleep study showed obstructive sleep apnea. OSA is overall mild, moderate in REM sleep and during supine sleep and worth treating to see if she feels better after treatment. To that end I recommend treatment for this in the form of autoPAP, which means, that we don't have to bring her back for a second sleep study with CPAP, but will let him try an autoPAP machine at home, through a DME company (of her choice, or as per insurance requirement). The DME representative will educate her on how to use the machine, how to put the mask on, etc. I have placed an order in the chart. Please send referral, talk to patient, send report to referring MD. We will need a FU in sleep clinic for 10 weeks post-PAP set up, please arrange that with me or one of our NPs. Thanks,   Star Age, MD, PhD [facility])

## 2020-01-12 NOTE — Telephone Encounter (Signed)
I reached out to the pt and advised of results. She was agreeable to information and f/u has been made. Pt will use [employer] as DME. Order has been sent and letter mailed to the pt.

## 2020-02-11 ENCOUNTER — Ambulatory Visit: Admit: 2020-02-11 | Discharge: 2020-02-12 | Payer: PRIVATE HEALTH INSURANCE

## 2020-02-11 DIAGNOSIS — L819 Disorder of pigmentation, unspecified: Principal | ICD-10-CM

## 2020-02-11 DIAGNOSIS — L608 Other nail disorders: Principal | ICD-10-CM

## 2020-02-11 MED ORDER — TRETINOIN 0.025 % TOPICAL CREAM
Freq: Every evening | TOPICAL | 1 refills | 90.00000 days | Status: CP
Start: 2020-02-11 — End: 2021-02-10

## 2020-02-18 ENCOUNTER — Ambulatory Visit: Admit: 2020-02-18 | Discharge: 2020-02-19 | Payer: PRIVATE HEALTH INSURANCE

## 2020-02-18 DIAGNOSIS — L608 Other nail disorders: Principal | ICD-10-CM

## 2020-02-18 MED ORDER — MUPIROCIN 2 % TOPICAL OINTMENT
Freq: Three times a day (TID) | TOPICAL | 1 refills | 0 days | Status: CP
Start: 2020-02-18 — End: 2020-03-03

## 2020-02-19 ENCOUNTER — Ambulatory Visit: Admit: 2020-02-19 | Discharge: 2020-02-20 | Payer: PRIVATE HEALTH INSURANCE

## 2020-03-07 ENCOUNTER — Ambulatory Visit: Admit: 2020-03-07 | Discharge: 2020-03-08 | Payer: PRIVATE HEALTH INSURANCE

## 2020-03-07 DIAGNOSIS — Q846 Other congenital malformations of nails: Principal | ICD-10-CM

## 2020-03-25 ENCOUNTER — Other Ambulatory Visit: Payer: Self-pay | Admitting: Family Medicine

## 2020-03-25 DIAGNOSIS — Z1231 Encounter for screening mammogram for malignant neoplasm of breast: Secondary | ICD-10-CM

## 2020-03-28 ENCOUNTER — Other Ambulatory Visit: Payer: Self-pay

## 2020-03-28 ENCOUNTER — Other Ambulatory Visit: Payer: Self-pay | Admitting: Family Medicine

## 2020-03-28 ENCOUNTER — Ambulatory Visit
Admission: RE | Admit: 2020-03-28 | Discharge: 2020-03-28 | Disposition: A | Discharge status: Home / Self Care | Payer: Commercial Managed Care - PPO | Source: Ambulatory Visit | Attending: Family Medicine | Admitting: Family Medicine

## 2020-03-28 DIAGNOSIS — N63 Unspecified lump in unspecified breast: Secondary | ICD-10-CM

## 2020-03-28 DIAGNOSIS — Z1231 Encounter for screening mammogram for malignant neoplasm of breast: Secondary | ICD-10-CM

## 2020-03-29 ENCOUNTER — Ambulatory Visit
Admission: RE | Admit: 2020-03-29 | Discharge: 2020-03-29 | Disposition: A | Discharge status: Home / Self Care | Payer: Commercial Managed Care - PPO | Source: Ambulatory Visit | Attending: Family Medicine | Admitting: Family Medicine

## 2020-03-29 ENCOUNTER — Other Ambulatory Visit: Payer: Self-pay | Admitting: Family Medicine

## 2020-03-29 DIAGNOSIS — N63 Unspecified lump in unspecified breast: Secondary | ICD-10-CM

## 2020-04-05 ENCOUNTER — Ambulatory Visit
Admission: RE | Admit: 2020-04-05 | Discharge: 2020-04-05 | Disposition: A | Discharge status: Home / Self Care | Payer: Commercial Managed Care - PPO | Source: Ambulatory Visit | Attending: Family Medicine | Admitting: Family Medicine

## 2020-04-05 ENCOUNTER — Other Ambulatory Visit: Payer: Self-pay

## 2020-04-05 DIAGNOSIS — N63 Unspecified lump in unspecified breast: Secondary | ICD-10-CM

## 2020-04-06 ENCOUNTER — Other Ambulatory Visit: Payer: Self-pay

## 2020-04-06 ENCOUNTER — Telehealth: Payer: Self-pay

## 2020-04-06 ENCOUNTER — Ambulatory Visit
Admission: RE | Admit: 2020-04-06 | Discharge: 2020-04-06 | Disposition: A | Discharge status: Home / Self Care | Payer: Commercial Managed Care - PPO | Source: Ambulatory Visit | Attending: Family Medicine | Admitting: Family Medicine

## 2020-04-06 DIAGNOSIS — N63 Unspecified lump in unspecified breast: Secondary | ICD-10-CM

## 2020-04-06 DIAGNOSIS — C50912 Malignant neoplasm of unspecified site of left female breast: Secondary | ICD-10-CM

## 2020-04-06 NOTE — Telephone Encounter (Signed)
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 Pt aware of results. Shon Hale, radiologist recommending MRI w/ contrast of L breast. Pt requesting referral to [facility] for general surgery and specific radiation oncologist Dr. Meda Klinefelter. Awaiting results from R breast. Manuela Schwartz can be reached at [phone] if further questions.

## 2020-04-06 NOTE — Telephone Encounter (Signed)
Called her to discuss- I am glad to place referrals as she requests  The oncologist she would like to see is in [place], also would like referral to [facility] general surgery  Referral are placed and she is asked to contact me if she does not hear about either of these referrals.  Offered encouragement and welcomed questions JC

## 2020-04-07 ENCOUNTER — Encounter: Payer: Self-pay | Admitting: Family Medicine

## 2020-04-07 ENCOUNTER — Other Ambulatory Visit: Payer: Self-pay

## 2020-04-07 DIAGNOSIS — C50912 Malignant neoplasm of unspecified site of left female breast: Secondary | ICD-10-CM

## 2020-04-07 NOTE — Telephone Encounter (Signed)
I have forms, im placing referrals as requested. If you could look at the [facility].

## 2020-04-11 ENCOUNTER — Ambulatory Visit: Payer: Self-pay | Admitting: Neurology

## 2020-04-11 ENCOUNTER — Encounter: Payer: Self-pay | Admitting: Family Medicine

## 2020-04-11 DIAGNOSIS — C50911 Malignant neoplasm of unspecified site of right female breast: Principal | ICD-10-CM

## 2020-04-14 ENCOUNTER — Encounter: Admit: 2020-04-14 | Discharge: 2020-04-14 | Payer: PRIVATE HEALTH INSURANCE

## 2020-04-14 DIAGNOSIS — C50911 Malignant neoplasm of unspecified site of right female breast: Principal | ICD-10-CM

## 2020-04-14 DIAGNOSIS — N6011 Diffuse cystic mastopathy of right breast: Principal | ICD-10-CM

## 2020-04-15 ENCOUNTER — Ambulatory Visit: Admit: 2020-04-15 | Discharge: 2020-04-16 | Payer: PRIVATE HEALTH INSURANCE

## 2020-04-15 DIAGNOSIS — C50912 Malignant neoplasm of unspecified site of left female breast: Principal | ICD-10-CM

## 2020-04-18 ENCOUNTER — Ambulatory Visit
Admit: 2020-04-18 | Discharge: 2020-04-19 | Payer: PRIVATE HEALTH INSURANCE | Attending: Radiation Oncology | Primary: Radiation Oncology

## 2020-04-19 ENCOUNTER — Ambulatory Visit
Admit: 2020-04-19 | Discharge: 2020-04-20 | Payer: PRIVATE HEALTH INSURANCE | Attending: Hematology & Oncology | Primary: Hematology & Oncology

## 2020-04-19 DIAGNOSIS — Z170 Estrogen receptor positive status [ER+]: Secondary | ICD-10-CM

## 2020-04-19 DIAGNOSIS — C50212 Malignant neoplasm of upper-inner quadrant of left female breast: Principal | ICD-10-CM

## 2020-04-19 DIAGNOSIS — C50911 Malignant neoplasm of unspecified site of right female breast: Principal | ICD-10-CM

## 2020-04-20 ENCOUNTER — Ambulatory Visit: Admit: 2020-04-20 | Discharge: 2020-04-21 | Payer: PRIVATE HEALTH INSURANCE

## 2020-04-20 DIAGNOSIS — C50212 Malignant neoplasm of upper-inner quadrant of left female breast: Principal | ICD-10-CM

## 2020-04-20 DIAGNOSIS — C50911 Malignant neoplasm of unspecified site of right female breast: Principal | ICD-10-CM

## 2020-04-21 ENCOUNTER — Ambulatory Visit: Admit: 2020-04-21 | Discharge: 2020-04-22 | Payer: PRIVATE HEALTH INSURANCE

## 2020-04-22 DIAGNOSIS — C50212 Malignant neoplasm of upper-inner quadrant of left female breast: Principal | ICD-10-CM

## 2020-04-22 DIAGNOSIS — Z170 Estrogen receptor positive status [ER+]: Principal | ICD-10-CM

## 2020-04-22 MED ORDER — PEGFILGRASTIM-BMEZ 6 MG/0.6 ML SUBCUTANEOUS SYRINGE
Freq: Once | SUBCUTANEOUS | 1 refills | 2 days | Status: CP
Start: 2020-04-22 — End: 2020-04-22

## 2020-04-25 DIAGNOSIS — Z170 Estrogen receptor positive status [ER+]: Principal | ICD-10-CM

## 2020-04-25 DIAGNOSIS — C50212 Malignant neoplasm of upper-inner quadrant of left female breast: Principal | ICD-10-CM

## 2020-05-02 ENCOUNTER — Ambulatory Visit: Payer: Self-pay | Admitting: Neurology

## 2020-05-02 ENCOUNTER — Ambulatory Visit: Admit: 2020-05-02 | Discharge: 2020-05-02 | Payer: PRIVATE HEALTH INSURANCE

## 2020-05-02 DIAGNOSIS — C50212 Malignant neoplasm of upper-inner quadrant of left female breast: Principal | ICD-10-CM

## 2020-05-02 DIAGNOSIS — Z170 Estrogen receptor positive status [ER+]: Secondary | ICD-10-CM

## 2020-05-03 ENCOUNTER — Ambulatory Visit: Admit: 2020-05-03 | Discharge: 2020-06-01 | Payer: PRIVATE HEALTH INSURANCE

## 2020-05-03 ENCOUNTER — Ambulatory Visit: Admit: 2020-05-03 | Discharge: 2020-05-04 | Payer: PRIVATE HEALTH INSURANCE

## 2020-05-03 MED ADMIN — iohexoL (OMNIPAQUE) 350 mg iodine/mL solution 100 mL: 100 mL | INTRAVENOUS | @ 13:00:00 | Stop: 2020-05-03

## 2020-05-03 MED ADMIN — Technetium Tc-99m Oxidronate HDP: 27.1 | INTRAVENOUS | @ 13:00:00 | Stop: 2020-05-03

## 2020-05-03 NOTE — Unmapped (Signed)
[facility] Specialty Medication Onboarding    Specialty Medication: Ziextenzo 6mg /0.63ml syringe  Prior Authorization: Approved   Financial Assistance: Yes - copay card approved as secondary   Final Copay/Day Supply: $0 / 28 days    Insurance Restrictions: Yes - max 1 month supply     Notes to Pharmacist:     The triage team has completed the benefits investigation and has determined that the patient is able to fill this medication at [facility]. Please contact the patient to complete the onboarding or follow up with the prescribing physician as needed.

## 2020-05-04 DIAGNOSIS — C50212 Malignant neoplasm of upper-inner quadrant of left female breast: Principal | ICD-10-CM

## 2020-05-04 DIAGNOSIS — Z170 Estrogen receptor positive status [ER+]: Principal | ICD-10-CM

## 2020-05-04 DIAGNOSIS — N83201 Unspecified ovarian cyst, right side: Principal | ICD-10-CM

## 2020-05-04 DIAGNOSIS — N83202 Unspecified ovarian cyst, left side: Principal | ICD-10-CM

## 2020-05-04 NOTE — Unmapped (Signed)
Called Frances Kennedy to discuss results of CT and bone scan, showing mediastinal lymphadenopathy and bilateral ovarian cysts. Will plan for CT surgery evaluation given anterior location of mediastinal nodes, not amenable to EBUS.    Will also obtain endovaginal [place] to further evaluate adnexal cysts.     Discussed with patient that the radiographic findings could represent distant metastatic disease which would shift our treatment paradigm from curative to palliative intent and alter our selection of chemotherapy and plans for surgery. Also discussed that a biopsy is needed to fully evaluate these findings, and we will await these results before proceeding with treatment.    Reiterated how to reach our team for any questions or concerns. Will keep 7/8 appt for now and adapt according to timing of biopsy.

## 2020-05-05 NOTE — Unmapped (Signed)
From: Wilfrid Lund, DO   Sent: 05/03/2020 ?? 2:38 PM EDT   ?? ?? ?? ?? ?? ?? ?? ?? ?? ?? ?? ?? ??   I looked at her CT. Her lymphadenopathy is very anterior and we can't get it to it bronchoscopically. ??I discussed with Dr. Juliene Pina who is agreeable to see her in clinic and also requests a PET CT prior to clinic visit.     Thanks,     Gwinda Passe, MD  Zannie Kehr  Thanks, Luna Kitchens. Placed PET order so you have it. Will await input from Dr. Juliene Pina.       ??   Previous Messages    ??  ----- Message -----   From: Zannie Kehr   Sent: 05/04/2020 ?? 3:34 PM EDT   To: Tonie Griffith, MD, *   Subject: RE: IP eval ?? ?? ?? ?? ?? ?? ?? ?? ?? ?? ?? ?? ?? ?? ?? ?? ??     Hey I dont have a PET order in for the pt ??and she will not be able to get one done prior to Tuesday , and I think they have nothing available until the week of 05/16/20, So, Dr. Juliene Pina do you want to see her w/o the PET? Pls advise how to proceed.     Thanks

## 2020-05-06 NOTE — Unmapped (Signed)
Spoke w/ pt and confirmed d/t of 05/13/20 @ 230 for PET scan, then she will see Dr. Juliene Pina on 05/17/20 @ 9am.     05/06/20 @11 :14, LK

## 2020-05-06 NOTE — Unmapped (Signed)
Left message about the [place] on 7.13 / left number to call back if needed

## 2020-05-06 NOTE — Unmapped (Signed)
Call made to patient to check in after multiple appointments have been scheduled. Reviewed scheduled appt dates/times for PET scan, new pt appt w/ Dr. Juliene Pina, endo-vaginal [place], and return w/ Dr. Rosalia Hammers.    Reviewed the plan of needing the biopsy results prior to initiating any type of therapy for her cancer. Encouraged patient to reach out to the team b/w now and when we see her next if she has any questions or concerns. Reiterated that we will plan to regroup once we have all the results from the biopsy/scans. She verbalized understanding and denied any questions or concerns at this time. She thanked for the call.

## 2020-05-08 DIAGNOSIS — C50212 Malignant neoplasm of upper-inner quadrant of left female breast: Principal | ICD-10-CM

## 2020-05-08 DIAGNOSIS — Z170 Estrogen receptor positive status [ER+]: Principal | ICD-10-CM

## 2020-05-09 DIAGNOSIS — C50919 Malignant neoplasm of unspecified site of unspecified female breast: Principal | ICD-10-CM

## 2020-05-09 NOTE — Unmapped (Signed)
Thank you for your e-consult to the [facility] Cancer & Adult Genetics team. Please consider sharing this information with your patient.    SUMMARY:  (from clinic notes)  Frances Kennedy, Frances Kennedy, is a 54 y.o. 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 The cancer staged as cT1c cN1, G3, with functionally triple negative receptors: weakly ER+ (5%), PR-, HER2-(stage, grade). Her family history is notable for breast cancer in a paternal aunt and the paternal grandmother.      This patient met NCCN criteria for evaluation of hereditary cancer risk based on:  ?? Diagnosis of breast cancer and 2 or more family members also diagnosed with breast cancer    Based on her personal and family history, the patient met NCCN criteria for evaluation of hereditary cancer risk. Genetic testing was recommended through discussion at the [facility] Multidisciplinary Breast Conference to guide treatment and therapy decisions.     RESULT. Normal result.   Testing did not identify any genetic changes associated with cancer predisposition.   Frances Kennedy test was performed at [facility] and included sequencing and deletion/duplication analysis of the following 9 genes: ATM, BRCA1, BRCA2, CDH1, CHEK2, PALB2, PTEN, STK11, and TP53.        GENETICS ASSESSMENT:  Genetic testing was normal. This suggests that the chance for a hereditary cancer risk syndrome is low. It most likely means that Frances Kennedy does not have a strong genetic factor contributing to her history of breast cancer.     After completing current treatment, the patient should follow the breast cancer screening guidelines outlined by her clinical team.     It is important to consider the family history of breast cancer in two paternal relatives and few close female relatives.  It is possible that another genetic factor or a combination of genetic and non-genetic factors could be responsible for the cancer in the patient and her family. Family members may wish to seek consultation with a cancer genetics specialist for further risk assessment.      Close family members may be at increased risk to develop various cancers, based simply on the family history of cancers. In particular, women in the immediate family are at a somewhat increased risk of developing breast cancer simply due to having a close relative with breast cancer. Family members should discuss the family history and this genetic testing result with their health team to determine the most appropriate approach to cancer screening for them    Changes in Frances Kennedy's personal or family history might alter our assessment of the chance that her cancer or those in the family were due to an inherited cancer risk. In addition, future advances in genetic testing might lead to additional ways to assess cancer risk in patients with normal genetic testing for inherited cancer genetics syndromes. Future re-evaluation may be appropriate.      RECOMMENDATIONS:      1) We do not recommend additional genetic testing for Ms. Kennedy at this time.    2) Formal Cancer Genetics consultation is not recommended but is available to the patient if they would like to meet with [place] at a future time to discuss the cancer history and possibility for an inherited cancer risk syndrome.    3) Family members may wish to discuss the family history with their providers to determine appropriate cancer screening        __________________________________________________    [name]  [phone]  I spent 5-10 minutes in medical consultative discussion and review of medical records, including a written report to the treating provider via electronic health record regarding the condition of this patient.    This e-Consult did include an answerable clinical question and did not recommend a clinic visit.    Vonna Drafts, MS, CGC - Genetic Counselor  Kimberlee Nearing. [name], MD - Medical Geneticist    The recommendations provided in this eConsult are based on the clinical data available to me and are furnished without the benefit of a comprehensive in-person evaluation of the patient. Any new clinical issues or changes in patient status not available to me will need to be taken into account when assessing these recommendations. The ongoing management of this patient is the responsibility of the referring clinician. Please contact me if you have further questions.

## 2020-05-10 MED ORDER — PROCHLORPERAZINE MALEATE 10 MG TABLET
ORAL_TABLET | 2 refills | 0 days | Status: CP
Start: 2020-05-10 — End: ?

## 2020-05-11 MED ORDER — ONDANSETRON HCL 4 MG TABLET
ORAL_TABLET | Freq: Three times a day (TID) | ORAL | 2 refills | 5 days | Status: CP | PRN
Start: 2020-05-11 — End: ?

## 2020-05-11 MED ORDER — PROCHLORPERAZINE MALEATE 10 MG TABLET
ORAL_TABLET | Freq: Four times a day (QID) | ORAL | 2 refills | 8.00000 days | Status: CP | PRN
Start: 2020-05-11 — End: ?

## 2020-05-11 MED ORDER — LORAZEPAM 0.5 MG TABLET
ORAL_TABLET | Freq: Every evening | ORAL | 0 refills | 12.00000 days | Status: CP | PRN
Start: 2020-05-11 — End: 2020-04-20

## 2020-05-11 MED ORDER — DEXAMETHASONE 4 MG TABLET
ORAL_TABLET | Freq: Every day | ORAL | 0 refills | 12 days | Status: CP
Start: 2020-05-11 — End: 2020-04-20

## 2020-05-13 ENCOUNTER — Ambulatory Visit: Admit: 2020-05-13 | Discharge: 2020-05-14 | Payer: PRIVATE HEALTH INSURANCE

## 2020-05-13 MED ADMIN — Fluorine F-18 FDG 4-40 mCi IV: 15 | INTRAVENOUS | @ 19:00:00 | Stop: 2020-05-13

## 2020-05-16 NOTE — Unmapped (Signed)
RN returned call to Annada at [facility] to inquire what information she is requesting and she states she would like to know:    How the pt is doing on treatment  What the treatment frequency is and  How many cycles are expected    RN will forward request to Dan Maker, [place]

## 2020-05-16 NOTE — Unmapped (Signed)
[name], [facility] Case Management contacted the Communication Center requesting to speak with the care team of Frances Kennedy to discuss:    Needs to get an update on the patient's Plan of care.      Fax:  [phone]    Please contact at [phone] ext [phone].    Program: Breast  Speciality: Medical Oncology    Check Indicates criteria has been reviewed and confirmed with the patient:    []  Preferred Name   [x]  DOB and/or MR#  [x]  Preferred Contact Method  [x]  Phone Number(s)   []  MyChart     Thank you,   Vernie Ammons  [facility] Cancer Communication Center   [phone]

## 2020-05-16 NOTE — Unmapped (Signed)
Call made to patient. Informed of message from [facility] and inquired if patient was comfortable w/ this RN discussing her POC w/ Dorothyann Gibbs. She stated that yes, she is fine w/ this. Reiterated plan to see her next week in clinic to regroup on her POC.    Attempted to reach [place] by phone at provided phone number; unable to reach. LVM explaining why this RN was calling and provided direct call back number for her to return the call.

## 2020-05-17 ENCOUNTER — Ambulatory Visit: Admit: 2020-05-17 | Discharge: 2020-05-18 | Payer: PRIVATE HEALTH INSURANCE

## 2020-05-17 ENCOUNTER — Ambulatory Visit: Admit: 2020-05-17 | Discharge: 2020-05-18 | Payer: PRIVATE HEALTH INSURANCE | Attending: Surgery | Primary: Surgery

## 2020-05-17 DIAGNOSIS — Z170 Estrogen receptor positive status [ER+]: Principal | ICD-10-CM

## 2020-05-17 DIAGNOSIS — C50212 Malignant neoplasm of upper-inner quadrant of left female breast: Principal | ICD-10-CM

## 2020-05-17 DIAGNOSIS — R591 Generalized enlarged lymph nodes: Principal | ICD-10-CM

## 2020-05-17 LAB — BASIC METABOLIC PANEL
ANION GAP: 6 mmol/L (ref 5–14)
BLOOD UREA NITROGEN: 13 mg/dL (ref 9–23)
BUN / CREAT RATIO: 17
CALCIUM: 10.1 mg/dL (ref 8.7–10.4)
CHLORIDE: 103 mmol/L (ref 98–107)
CO2: 27 mmol/L (ref 20.0–31.0)
EGFR CKD-EPI AA FEMALE: >90 mL/min/{1.73_m2} (ref >=60)
EGFR CKD-EPI NON-AA FEMALE: 87 mL/min/{1.73_m2} (ref >=60)
GLUCOSE RANDOM: 125 mg/dL (ref 70–179)
POTASSIUM: 4 mmol/L (ref 3.4–4.5)
SODIUM: 136 mmol/L (ref 135–145)

## 2020-05-17 LAB — CBC
HEMATOCRIT: 38.4 % (ref 36.0–46.0)
HEMOGLOBIN: 12.3 g/dL (ref 12.0–16.0)
MEAN CORPUSCULAR HEMOGLOBIN: 28.7 pg (ref 26.0–34.0)
MEAN CORPUSCULAR VOLUME: 89.8 fL (ref 80.0–100.0)
MEAN PLATELET VOLUME: 9.1 fL (ref 7.0–10.0)
PLATELET COUNT: 330 10*9/L (ref 150–440)
RED BLOOD CELL COUNT: 4.28 10*12/L (ref 4.00–5.20)
RED CELL DISTRIBUTION WIDTH: 14.5 % (ref 12.0–15.0)

## 2020-05-17 LAB — RED BLOOD CELL COUNT: Lab: 4.28

## 2020-05-17 LAB — CHLORIDE: Chloride:SCnc:Pt:Ser/Plas:Qn:: 103

## 2020-05-17 NOTE — Unmapped (Signed)
Attempted to reach [place] at provided phone number again w/o success. LVM yesterday, will await a call back.

## 2020-05-17 NOTE — Unmapped (Signed)
THORACIC SURGERY NEW PATIENT CONSULT NOTE      HISTORY OF PRESENT ILLNESS     Today I saw Frances Kennedy in the Multidisciplinary Thoracic Oncology Clinic at the [place] [facility] in consultation for FDG-avid anterior mediastinal lymph nodes in the setting of recently diagnosed clinical Stage IIB invasive ductal carcinoma, seen at the request of Dr. Wendall Papa.    Her history was obtained from the patient.  I have also reviewed her medical records and imaging prior to today's visit.  As you know, Frances Kennedy is a 54 y.o. African American female never-smoker and a history of diabetes (on metformin). In May of this year she self-identified a left breast mass. Biopsies performed on April 14, 2020 showed the left breast mass to be invasive ductal carcinoma with metastasis to a left axillary lymph node. A CT scan performed on May 03, 2020 identified enlarged anterior mediastinal (prevascular and left internal mammary) lymph node which were also avid on PET scan performed May 13, 2020. As these are not amenable to endobronchial biopsy, she presents to discuss surgical biopsy to guide treatment planning.   Today Frances Kennedy is feeling well and denies recent fevers, chills, chest pain, hemoptysis, nausea, and vomiting. She continues to work and has no difficulties with her usual activities. She believes she could walk approximately 1 mile, however would need to stop due to dyspnea which is her usual, and she denies unintentional weight loss.       REVIEW OF SYSTEMS  A comprehensive review of 10 systems was negative except for pertinent positives noted in HPI.    PAST MEDICAL HISTORY  Cardiopulmonary history:     Hypertension: no     CHF: no      CAD: no      Myocardial Infarction: no     Atrial fibrillation: no      Valvular heart disease: no      Pulmonary hypertension: no      Interstitial lung disease: no    Vascular history: no    Cerebrovascular history: no    Neuromuscular disease: no Endocrine/GI/renal history: yes    Cancer history: yes    Home oxygen:  no    Immunosuppression: no    Dementia/neurocognitive dysfunction:  no    Major Psychiatric Disorder:  no    Other Past Medical History  Past Medical History:   Diagnosis Date   ??? Breast cancer metastasized to axillary lymph node, left (CMS-HCC) 2019   ??? Pre-diabetes    ??? Sleep apnea          PAST SURGICAL HISTORY  Prior cardiothoracic surgery: no    Past Surgical History:   Procedure Laterality Date   ??? HYSTERECTOMY  2016   ??? IR INSERT PORT AGE GREATER THAN 5 YRS  05/02/2020    IR INSERT PORT AGE GREATER THAN 5 YRS 05/02/2020 Braulio Conte, MD IMG [facility]   ??? KNEE ARTHROSCOPY     ??? mastopexy Bilateral 2004       FAMILY MEDICAL HISTORY  Family history of heart disease:  no  Family history of lung cancer:  no    Family History   Problem Relation Age of Onset   ??? Stroke Mother    ??? Stroke Father    ??? No Known Problems Brother    ??? Breast cancer Paternal Grandmother 66   ??? No Known Problems Brother    ??? No Known Problems Brother    ???  Breast cancer Paternal Aunt 71   ??? No Known Problems Son    ??? No Known Problems Son    ??? No Known Problems Son    ??? No Known Problems Daughter    ??? No Known Problems Daughter    ??? No Known Problems Daughter    ??? Melanoma Neg Hx    ??? Basal cell carcinoma Neg Hx    ??? Squamous cell carcinoma Neg Hx    ??? Pancreatic cancer Neg Hx    ??? Prostate cancer Neg Hx    ??? Uterine cancer Neg Hx    ??? Ovarian cancer Neg Hx              SOCIAL HISTORY  Smoking history - non-smoker  Other Nicotine Use- No  Narcotic dependency - no  Alcohol abuse- No  Living status - with spouse    Social History     Socioeconomic History   ??? Marital status: Single     Spouse name: Not on file   ??? Number of children: Not on file   ??? Years of education: Not on file   ??? Highest education level: Not on file   Occupational History   ??? Not on file   Tobacco Use   ??? Smoking status: Never Smoker   ??? Smokeless tobacco: Never Used   Vaping Use   ??? Vaping Use: Never used   Substance and Sexual Activity   ??? Alcohol use: Yes     Comment: Very rarely   ??? Drug use: Not Currently   ??? Sexual activity: Yes     Partners: Male     Birth control/protection: Surgical     Comment: s/p hysterectomy   Other Topics Concern   ??? Do you use sunscreen? No   ??? Tanning bed use? No   ??? Are you easily burned? No   ??? Excessive sun exposure? No   ??? Blistering sunburns? No   Social History Narrative    Married, works as Artist at [employer].  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      Husband had a stroke last year which affected his L side, he is R side dominant and relatively well recovered but not working.  He enjoys bowling in tournaments and pt will attend with him at times.      She also enjoys shopping (online and in person).    She has 6 children (3 sons, 3 daughters).     Social Determinants of Health     Financial Resource Strain:    ??? Difficulty of Paying Living Expenses:    Food Insecurity:    ??? Worried About Programme researcher, broadcasting/film/video in the Last Year:    ??? Barista in the Last Year:    Transportation Needs:    ??? Freight forwarder (Medical):    ??? Lack of Transportation (Non-Medical):    Physical Activity:    ??? Days of Exercise per Week:    ??? Minutes of Exercise per Session:    Stress:    ??? Feeling of Stress :    Social Connections:    ??? Frequency of Communication with Friends and Family:    ??? Frequency of Social Gatherings with Friends and Family:    ??? Attends Religious Services:    ??? Database administrator or Organizations:    ??? Attends Banker Meetings:    ??? Marital  Status:         MEDICATIONS  Current Outpatient Medications   Medication Sig Dispense Refill   ??? ibuprofen (MOTRIN) 800 MG tablet TAKE 1 TABLET BY MOUTH EVERY 8 HOURS WITH FOOD AS NEEDED FOR PAIN 40 tablet 2   ??? metFORMIN (GLUCOPHAGE) 500 MG tablet Take 500 mg by mouth.      ??? dexAMETHasone (DECADRON) 4 MG tablet Take 8 mg by mouth daily. Take 2 Tabs Daily(8mg ) on Days 2, 3, and 4. (Patient not taking: Reported on 04/20/2020)     ??? hydrOXYzine (ATARAX) 10 MG tablet Take 10 mg by mouth. (Patient not taking: Reported on 04/20/2020)     ??? LORazepam (ATIVAN) 0.5 MG tablet Take 0.5 mg by mouth nightly as needed for anxiety. Nightly as needed(difficulty sleeping) (Patient not taking: Reported on 04/20/2020)     ??? ondansetron (ZOFRAN) 4 MG tablet Take 2 tablets (8 mg total) by mouth every eight (8) hours as needed for nausea. (Patient not taking: Reported on 04/20/2020) 30 tablet 2   ??? prochlorperazine (COMPAZINE) 10 MG tablet TAKE 1 TABLET(10 MG) BY MOUTH EVERY 6 HOURS AS NEEDED FOR NAUSEA (Patient not taking: Reported on 05/17/2020) 30 tablet 2   ??? tretinoin (RETIN-A) 0.025 % cream Apply 1 application topically nightly. To face at bedtime, starting two nights per week and increase to nightly as tolerated with dryness (Patient not taking: Reported on 04/20/2020) 45 g 1     No current facility-administered medications for this visit.       ALLERGIES  has No Known Allergies.    PHYSICAL EXAM  Vital Signs: BP 168/77  - Pulse 88  - Temp 36.6 ??C (97.9 ??F) (Temporal)  - Resp 20  - Ht 162.6 cm (5' 4)  - Wt (!) 108.4 kg (239 lb)  - SpO2 100%  - BMI 41.02 kg/m??  Body mass index is 41.02 kg/m??.     CONSTITUTIONAL:  On examination, Frances Kennedy is a well-nourished, well-developed female who is no acute distress and answers questions appropriately.      SKIN:  Warm and dry without exanthem.      EARS, NOSE, MOUTH, THROAT:  Normocephalic and atraumatic.  Oral mucosa is moist.  There is no obvious bleeding in the gum.  Oropharynx is without erythema or exudate.      EYES:  Pupils are equal, round and reactive to light.  Extraocular movements are intact.      RESPIRATORY:  Nonlabored breathing on room air     CARDIOVASCULAR:  No elevation of JVP. Normal heart rate, regular rhythm.      GASTROINTESTINAL:  Soft, nontender, nondistended in all quadrants.      EXTREMITIES:  Warm without clubbing, edema or cyanosis.      NEUROLGOIC:  The patient is oriented to person, place and time.  Strength and sensation are grossly intact.  Face is symmetric.           ASSESSMENT Roosvelt Harps  In summary, Frances Kennedy is a 55 y.o. female with a history of recently diagnosed clinical Stage IIB invasive ductal carcinoma found to have enlarged and FDG-avid anterior mediastinal lymph nodes concerning for metastatic disease. I have discussed with Ms. Demas the various treatment options available.  Specifically, we engaged in a shared decision making process.  We discussed the risks and benefits of surgery and of non-operative therapy.   We discussed with Frances Kennedy what would be involved in the surgical biopsy of the lymph nodes, namely a left  video assisted thoracoscopic surgery with lymph node biopsies and possible thoracotomy. She understands the risks of the procedure include, but are not limited to, infection, bleeding, damage to surrounding structures, benign lymph nodes, heart attack, stroke, and death. She asked appropriate questions and would like to proceed. Surgery has been scheduled for May 20, 2020 and consent was signed and witnessed today in clinic. Preoperative labs, EKG, and pulmonary function testing will be obtained today, and we will scan her COVID vaccination record into the chart.       Thank you for this kind referral.      Patient seen by and discussed with Dr. Juliene Pina.

## 2020-05-17 NOTE — Unmapped (Addendum)
ERAS:   Enhanced Recovery After Surgery    The [facility] department of surgery and the  department of anesthesiology have partnered together to provide you the best possible care throughout your surgical experience. Together we have  created a clinical pathway for  surgical patients called ???Enhanced Recovery After Surgery (ERAS)???. The ERAS clinical pathway is a set of well-established best practice guidelines. These best practice guidelines represent the best available medical and surgical evidence. The ERAS clinical pathway will:      ? Improve the quality of your care  ? Accelerate your recovery  ? Decrease your risk of complications  ? Reduce your length of stay (so you can go home sooner)  ? Reduce your pain level ??? we want you to be as comfortable as possible throughout your entire surgical experience      THE ERAS CLINICAL PATHWAY IS NOT A RESEARCH STUDY.   IT IS A COLLECTION OF ALREADY PROVEN, BEST PRACTICE GUIDELINES TO IMPROVE YOUR CARE.          ?? NPO after midnight except clears until two hours before your scheduled arrival time.   ?? Clears includes water and gatorade. We recommend one small 12 oz bottle of Pre Surgery Ensure two hours before your scheduled arrival time.  ?? For example; If your scheduled arrival time is 7:00 am, you may drink water or Gatorade up until 5:00 am.   ?? DO NOT DRINK ANYTHING after this time or else you surgery may be postponed or canceled.          Pain Management: Thoracic epidural  ? Benefits include superior pain control, decreased risk of nausea and vomiting, targeted pain control to your abdomen, less likely to develop confusion or excessive drowsiness, and allows you to get out of bed sooner.   Also allows for your bowels to ???wake up??? sooner after surgery so that you may begin to eat and drink again.  ? Risks include bleeding and infection, as well as a 10-20% risk of a headache.  ? Placed on the day of surgery under sedation by a team of pain management specialists.    ? Minimal risks, plenty of benefits.     ? Optional but highly recommended  ?     *The precare office will call you on this day to tell you what time to report for surgery. **          (If you have not heard from precare by 4:00 pm please call [phone])            Thoracic Surgery Contact Information    If you have questions before your procedure, please contact the Thoracic Surgery clinic directly at [phone].     Your FMLA paperwork can be faxed to [phone].

## 2020-05-17 NOTE — Unmapped (Signed)
Verbal and written instructions given on ERAS, Enhanced Recovery after Surgery. Patient and family verbalized understanding.  Verbal and written instructions given on Incentive Spirometry, patient verbalized understanding. Patient aware to bring  Incentive Spirometry log book on the day of surgery.   Surgical consent witnessed and placed in tray to be scanned into epic. Labs drawn from left antecubital vein for BMP, T&S and CBC, sent to lab for analysis .EKG in room for procedure. Patient left clinic ambulatory after test completed. B.Wilford Merryfield RN

## 2020-05-20 ENCOUNTER — Ambulatory Visit
Admit: 2020-05-20 | Discharge: 2020-05-24 | Disposition: A | Discharge status: Home / Self Care | Payer: PRIVATE HEALTH INSURANCE | Admitting: Surgery

## 2020-05-20 ENCOUNTER — Encounter
Admit: 2020-05-20 | Discharge: 2020-05-24 | Disposition: A | Discharge status: Home / Self Care | Payer: PRIVATE HEALTH INSURANCE | Admitting: Surgery

## 2020-05-20 MED ADMIN — bupivacaine liposome (PF) (EXPAREL) 266 mg, sodium chloride (NS) 0.9 % 1 mL infiltration injection: 266 mg | @ 17:00:00 | Stop: 2020-05-20

## 2020-05-20 MED ADMIN — acetaminophen (TYLENOL) tablet 1,000 mg: 1000 mg | ORAL | @ 13:00:00 | Stop: 2020-05-20

## 2020-05-20 MED ADMIN — mupirocin (BACTROBAN) 2 % ointment 1 application: 1 | NASAL | @ 13:00:00 | Stop: 2020-05-20

## 2020-05-20 MED ADMIN — ROCuronium (ZEMURON) injection: INTRAVENOUS | @ 15:00:00 | Stop: 2020-05-20

## 2020-05-20 MED ADMIN — lactated Ringers infusion: INTRAVENOUS | @ 15:00:00 | Stop: 2020-05-20

## 2020-05-20 MED ADMIN — electrolyte-A (PLASMA-LYT A) infusion: INTRAVENOUS | @ 15:00:00 | Stop: 2020-05-20

## 2020-05-20 MED ADMIN — chlorhexidine (PERIDEX) 0.12 % solution 15 mL: 15 mL | OROMUCOSAL | @ 13:00:00 | Stop: 2020-05-20

## 2020-05-20 MED ADMIN — glycopyrrolate (ROBINUL) injection: INTRAVENOUS | @ 17:00:00 | Stop: 2020-05-20

## 2020-05-20 MED ADMIN — fentaNYL (PF) (SUBLIMAZE) injection: INTRAVENOUS | @ 18:00:00 | Stop: 2020-05-20

## 2020-05-20 MED ADMIN — fentaNYL (PF) (SUBLIMAZE) injection: INTRAVENOUS | @ 16:00:00 | Stop: 2020-05-20

## 2020-05-20 MED ADMIN — fentaNYL (PF) (SUBLIMAZE) injection: INTRAVENOUS | @ 15:00:00 | Stop: 2020-05-20

## 2020-05-20 MED ADMIN — ROCuronium (ZEMURON) injection: INTRAVENOUS | @ 17:00:00 | Stop: 2020-05-20

## 2020-05-20 MED ADMIN — ondansetron (ZOFRAN) injection: INTRAVENOUS | @ 17:00:00 | Stop: 2020-05-20

## 2020-05-20 MED ADMIN — EXPAREL ADMINISTERED WITHIN 96 HOURS - NO BUPIVACAINE FOR 96 HOURS AFTER EXPAREL: 1 | @ 17:00:00 | Stop: 2020-05-24

## 2020-05-20 MED ADMIN — propofoL (DIPRIVAN) injection: INTRAVENOUS | @ 15:00:00 | Stop: 2020-05-20

## 2020-05-20 MED ADMIN — neostigmine (BLOXIVERZ) injection: INTRAVENOUS | @ 17:00:00 | Stop: 2020-05-20

## 2020-05-20 MED ADMIN — midazolam (VERSED) injection: INTRAVENOUS | @ 15:00:00 | Stop: 2020-05-20

## 2020-05-20 MED ADMIN — celecoxib (CeleBREX) capsule 200 mg: 200 mg | ORAL | @ 13:00:00 | Stop: 2020-05-20

## 2020-05-20 MED ADMIN — DULoxetine (CYMBALTA) DR capsule 30 mg: 30 mg | ORAL | @ 13:00:00 | Stop: 2020-05-20

## 2020-05-20 MED ADMIN — cellulose, oxidized reg 2"X 14" pad (SURGICEL): TOPICAL | @ 17:00:00 | Stop: 2020-05-20

## 2020-05-20 MED ADMIN — pregabalin (LYRICA) capsule 100 mg: 100 mg | ORAL | @ 13:00:00 | Stop: 2020-05-20

## 2020-05-20 MED ADMIN — heparin (porcine) 5,000 unit/mL injection 5,000 Units: 5000 [IU] | SUBCUTANEOUS | @ 13:00:00 | Stop: 2020-05-20

## 2020-05-20 MED ADMIN — lactated Ringers infusion: 10 mL/h | INTRAVENOUS | @ 13:00:00

## 2020-05-20 MED ADMIN — HYDROmorphone (DILAUDID) 50mg/50ml (1mg/ml) PCA CADD: INTRAVENOUS | @ 18:00:00 | Stop: 2020-05-20

## 2020-05-20 MED ADMIN — propofoL (DIPRIVAN) injection: INTRAVENOUS | @ 17:00:00 | Stop: 2020-05-20

## 2020-05-20 MED ADMIN — lidocaine (XYLOCAINE) 20 mg/mL (2 %) injection: INTRAVENOUS | @ 15:00:00 | Stop: 2020-05-20

## 2020-05-20 MED ADMIN — cellulose, oxidized reg 2"X 14" pad (SURGICEL): TOPICAL | @ 16:00:00 | Stop: 2020-05-20

## 2020-05-20 MED ADMIN — ceFAZolin (ANCEF) IVPB 2 g in 50 ml dextrose (premix): 2 g | INTRAVENOUS | @ 15:00:00 | Stop: 2020-05-20

## 2020-05-20 NOTE — Unmapped (Cosign Needed)
Operative Note  (CSN: [id number])      Date of Surgery: 05/20/2020    Pre-op Diagnosis: mediastinal lymphadenopathy    Post-op Diagnosis: same    Procedure(s):  THORACOSCOPY, SURGICAL; WITH MEDIASTINAL & REGIONAL LYMPHADENECTOMY:   Note: Revisions to procedures should be made in chart - see Procedures activity.    Performing Service: Thoracic  Surgeon(s) and Role:     * Gita Werner Lean, MD - Primary     * Kirke Shaggy, MD - Resident - Assisting     * Dia Crawford, MD - Resident - Assisting    Assistant: None    Anesthesia: General    Estimated Blood Loss: 10 mL    Complications: None    Specimens:   ID Type Source Tests Collected by Time Destination   1 : Left Chest Lymph Node Tissue Chest SURGICAL PATHOLOGY EXAM Monico Hoar, MD 05/20/2020 1057    2 : Internal Mammary Lymph Node Tissue Chest SURGICAL PATHOLOGY EXAM Monico Hoar, MD 05/20/2020 1249        Implants: * No implants in log *    Indications: 54 yo F with PMH of breast cancer who presented with mediastinal lymphadenopathy concerning for metastasis. The decision was made to proceed with left VATS mediastinal lymph node biopsy after discussion of risks and benefits of the procedure.    Operative Findings: Station 6 lymph node and anterior mediastinal lymph node surrounding LIMA biopsied via L VATS. Frozen section for station 6 lymph node negative for malignancy.    Procedure Description:   The patient was brought into the operating room, where a preinduction timeout was performed, confirming name, medical record number, and planned procedure. She was given general anesthesia and was intubated with a double lumen endotracheal tube. The patient was then placed in the right lateral decubitus position. Her left chest was then prepped and draped in the standard sterile fashion. Another timeout was performed.     A 1 cm incision was placed at the 7th intercostal space along the ASIS. The thoracoscope was inserted into the pleural space. Two other incisions were made along 4th intercostal space in the anterior axillary line and posteriorly below the tip of the scapula. We identified the station 6 lymph node. Using the harmonic scalpel and electrocautery bovie, we dissected out the station 6 lymph node and sent this to pathology. Frozen section for station 6 lymph node was negative for malignancy. We then identified the anterior mediastinal lymph node surrounding the LIMA (medial to phrenic nerve), dissected this out with harmonic scalpel and electrocautery bovie, and sent to pathology. An intercostal nerve block was performed under direct vision with the thoracoscope using 20 mL of Exparel solution from the 2nd to the 10 intercostal space. A 28 french chest tube was inserted. The lung was inflated under direct visualization and the thoracoscope was removed. The incisions were closed in layers and sterile dressings were placed in the operating room. The patient was placed in the supine position, awoken and extubated. She tolerated the procedure well and was transferred to the recovery room in stable condition. All instrument and sponge counts were correct x2 at the end of the case.    Surgeon Notes: Dr. Juliene Pina was present and scrubbed for the entire procedure    Kirke Shaggy   Date: 05/20/2020  Time: 1:30 PM

## 2020-05-20 NOTE — Unmapped (Signed)
Frances Kennedy is a 54 y.o. African American female never-smoker and a history of diabetes (on metformin). In May of this year she self-identified a left breast mass. Biopsies performed on April 14, 2020 showed the left breast mass to be invasive ductal carcinoma with metastasis to a left axillary lymph node. A CT scan performed on May 03, 2020 identified enlarged anterior mediastinal (prevascular and left internal mammary) lymph node which were also avid on PET scan performed May 13, 2020. On May 20, 2020 she underwent left video assisted thoracoscopic surgery with lymph node biopsies. She tolerated the procedure well, was extubated in the operating room, and was taken to the recovery room where she received routine postoperative care until transfer to the floor. She progressed well. The chest tube was removed on POD1*** with stable follow-up imaging.   By discharge [name] was hemodynamically stable on room air, tolerating a regular diet, voiding spontaneously, passing flatus, ambulating in the hallways, and her pain was controlled with oral agents. She will be discharged with scheduled follow-up in the thoracic surgery clinic.

## 2020-05-20 NOTE — Unmapped (Cosign Needed)
Brief Operative Note  (CSN: [id number])      Date of Surgery: 05/20/2020    Pre-op Diagnosis: mediastinal lymphadenopathy    Post-op Diagnosis: same    Procedure(s):  THORACOSCOPY, SURGICAL; WITH MEDIASTINAL & REGIONAL LYMPHADENECTOMY:   Note: Revisions to procedures should be made in chart - see Procedures activity.    Performing Service: Thoracic  Surgeon(s) and Role:     * Gita Werner Lean, MD - Primary     * Kirke Shaggy, MD - Resident - Assisting     * Dia Crawford, MD - Resident - Assisting    Assistant: None    Findings: Station 6 lymph node and anterior mediastinal lymph node surrounding LIMA biopsied via L VATS. Frozen section for station 6 lymph node negative for malignancy.    Anesthesia: General    Estimated Blood Loss: 10 mL    Complications: None    Specimens:   ID Type Source Tests Collected by Time Destination   1 : Left Chest Lymph Node Tissue Chest SURGICAL PATHOLOGY EXAM Monico Hoar, MD 05/20/2020 1057    2 : Internal Mammary Lymph Node Tissue Chest SURGICAL PATHOLOGY EXAM Monico Hoar, MD 05/20/2020 1249        Implants: * No implants in log *    Surgeon Notes: Dr. Juliene Pina was present and scrubbed for the entire procedure    Kirke Shaggy   Date: 05/20/2020  Time: 1:28 PM

## 2020-05-21 LAB — POTASSIUM: Potassium:SCnc:Pt:Ser/Plas:Qn:: 4

## 2020-05-21 LAB — BASIC METABOLIC PANEL
ANION GAP: 4 mmol/L — ABNORMAL LOW (ref 5–14)
BLOOD UREA NITROGEN: 9 mg/dL (ref 9–23)
BUN / CREAT RATIO: 11
CALCIUM: 9.3 mg/dL (ref 8.7–10.4)
CHLORIDE: 99 mmol/L (ref 98–107)
CO2: 30 mmol/L (ref 20.0–31.0)
CREATININE: 0.83 mg/dL — ABNORMAL HIGH
EGFR CKD-EPI NON-AA FEMALE: 79 mL/min/{1.73_m2} (ref >=60)
GLUCOSE RANDOM: 132 mg/dL (ref 70–179)
POTASSIUM: 4 mmol/L (ref 3.4–4.5)

## 2020-05-21 LAB — MAGNESIUM
MAGNESIUM: 1.8 mg/dL (ref 1.6–2.6)
Magnesium:MCnc:Pt:Ser/Plas:Qn:: 1.8

## 2020-05-21 LAB — CBC
HEMATOCRIT: 35.2 % — ABNORMAL LOW (ref 36.0–46.0)
HEMOGLOBIN: 11.5 g/dL — ABNORMAL LOW (ref 12.0–16.0)
MEAN CORPUSCULAR HEMOGLOBIN CONC: 32.7 g/dL (ref 31.0–37.0)
MEAN CORPUSCULAR VOLUME: 89.9 fL (ref 80.0–100.0)
PLATELET COUNT: 279 10*9/L (ref 150–440)
RED CELL DISTRIBUTION WIDTH: 14.5 % (ref 12.0–15.0)
WBC ADJUSTED: 9.7 10*9/L (ref 4.5–11.0)

## 2020-05-21 LAB — PLATELET COUNT: Platelets:NCnc:Pt:Bld:Qn:Automated count: 279

## 2020-05-21 LAB — PHOSPHORUS: Phosphate:MCnc:Pt:Ser/Plas:Qn:: 3.6

## 2020-05-21 MED ADMIN — ibuprofen (MOTRIN) tablet 600 mg: 600 mg | ORAL | @ 16:00:00

## 2020-05-21 MED ADMIN — ibuprofen (MOTRIN) tablet 600 mg: 600 mg | ORAL | @ 01:00:00

## 2020-05-21 MED ADMIN — heparin (porcine) 5,000 unit/mL injection 5,000 Units: 5000 [IU] | SUBCUTANEOUS | @ 10:00:00

## 2020-05-21 MED ADMIN — ibuprofen (MOTRIN) tablet 600 mg: 600 mg | ORAL | @ 07:00:00

## 2020-05-21 MED ADMIN — acetaminophen (TYLENOL) tablet 1,000 mg: 1000 mg | ORAL | @ 07:00:00

## 2020-05-21 MED ADMIN — acetaminophen (TYLENOL) tablet 1,000 mg: 1000 mg | ORAL | @ 12:00:00

## 2020-05-21 MED ADMIN — oxyCODONE (ROXICODONE) immediate release tablet 10 mg: 10 mg | ORAL | @ 12:00:00 | Stop: 2020-06-03

## 2020-05-21 MED ADMIN — gabapentin (NEURONTIN) capsule 300 mg: 300 mg | ORAL | @ 19:00:00

## 2020-05-21 MED ADMIN — docusate sodium (COLACE) capsule 100 mg: 100 mg | ORAL | @ 12:00:00

## 2020-05-21 MED ADMIN — acetaminophen (TYLENOL) tablet 1,000 mg: 1000 mg | ORAL | @ 19:00:00

## 2020-05-21 MED ADMIN — heparin (porcine) 5,000 unit/mL injection 5,000 Units: 5000 [IU] | SUBCUTANEOUS | @ 19:00:00

## 2020-05-21 MED ADMIN — acetaminophen (TYLENOL) tablet 1,000 mg: 1000 mg | ORAL | @ 01:00:00

## 2020-05-21 MED ADMIN — gabapentin (NEURONTIN) capsule 300 mg: 300 mg | ORAL | @ 16:00:00

## 2020-05-21 MED ADMIN — HYDROmorphone (PF) (DILAUDID) injection 1 mg: 1 mg | INTRAVENOUS | @ 01:00:00 | Stop: 2020-05-20

## 2020-05-21 NOTE — Unmapped (Signed)
Thoracic Surgery Progress Note  Length of Stay: 1  Post-op Day: 1 Day Post-Op    CT output (last 24/last shift/yesterday): 80/80/NA  Chest Tube Status: Remove today  Foley Status: No  Observed IS Volume: 250      Assessment/Plan:  Active Problems:    * No active hospital problems. *         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 PET/CT showed pet-avid mediastinal lymphnodes concerning for more advanced metastatic disease. She is now s/p L VATS for mediastinal lymphnode biopsy for appropriate staging.       Neuro: dPCA, gabapentin, acetaminophen, oxycodone PRN for pain    Card: HDS, slight HTN to 160s  - NTD      Pulm: CT removed, with improvement in discomfort   IS, ambulate, vest therapy    FEN: Regular diet.     GI: Colace and miralax for bowel regimen   Zofran PRN for nausea     GU: Bladder scan showed , but has been spontaneously voiding ~369mL  urine. Repeat bladder scan showed ~560 mL this morning.   - CTM if she continues to spontaneously void     ID:   - WBC 9.7, afebrile     Heme: SQH  - hgb 11.9 stable     Diet: Regular   PPx: SQH  Dispo: SDS    Subjective:  Pain has been her biggest concern, requiring prn's overnight. She only used her PCA 3 times since post-op so we discussed pressing the button as needed. IS 250, limited to pain. Good cough during IS. Has not ambulated yet or gotten to chair.    Objective:  BP 139/78  - Pulse 84  - Temp 36.5 ??C (97.7 ??F) (Axillary)  - Resp 21  - Ht 162.6 cm (5' 4.02)  - Wt (!) 109.4 kg (241 lb 2.9 oz)  - SpO2 94%  - BMI 41.38 kg/m??     Physical Exam:  General:  Well-developed, well-nourished, sitting up in bed in no acute distress  Neuro:  Awake, alert and oriented to person, place, and time.   Resp:  Non labored breathing on 2L.   Chest wall: L sided chest tube removed, dressing in place.   GI:   Abdomen soft, nontender, nondistended  Extremities: Warm and well-perfused. No pedal edema  Skin:   Incisions c/d/i with island dressing in place.     Pertinent Labs: Lab results personally reviewed and reviewed by thoracic team.   Pertinent Imaging: Images personally reviewed and reviewed by thoracic team.        Teresa Pelton, MS4          I attest that I have reviewed the medical student note and that the components of the history of the present illness, the physical exam, and the assessment and plan documented were performed by me or were performed in my presence by the student where I verified the documentation and performed (or re-performed) the exam and medical decision making.   Alexis Goodell , Anesthesia PGY1

## 2020-05-21 NOTE — Unmapped (Signed)
Neuro:  Aox4, pleasant cooperative with care    Cardiac: SR on tele, no issues    Resp: on O2 at 2 L per [place], no issues, mild shortness of breath    Gi: no BM yet today, regular diet,     Gu: voiding in [facility], issues with urine retention, voiding trial monitoring closely    Msk: refused to ambulate, only transferred to [facility] with assistance    Skin: CT removed today to L flank, dressing to surgical wound is intact, dry to left back side    Pain: PCA pump in use, oxycodone also given for pain management;    Plan: PCA use, CT removed today, monitoring urine output, last bladder scan positive for retention- SRT team aware, pt reluctant to ambulate d/t pain, encouragement and support provided, possible transfer to the floor later today.   Problem: Adult Inpatient Plan of Care  Goal: Plan of Care Review  Outcome: Progressing  Goal: Patient-Specific Goal (Individualization)  Outcome: Progressing  Goal: Absence of Hospital-Acquired Illness or Injury  Outcome: Progressing  Goal: Optimal Comfort and Wellbeing  Outcome: Progressing  Goal: Readiness for Transition of Care  Outcome: Progressing  Goal: Rounds/Family Conference  Outcome: Progressing     Problem: Wound  Goal: Optimal Wound Healing  Outcome: Progressing

## 2020-05-21 NOTE — Unmapped (Signed)
Neuro: Aox2, drowsy, arrived from PACU, somnolent, disoriented to situation, time    Cardiac: SR on tele, no issues    Resp: on o2 at 2L per [place], no issues    Gi: BS present, hypoactive, no BM yet    Gu: no foley, pt sleeping, from PACU    Msk: unable to assess, from PACU, able to self turn    SkIn: dressing to L flank intact, CT to -20cm sx, dressing intact with old shadow drainage to L posterior back side    Pain: PCA pump intact, pt sleepy, drowsy;    Plan: arrived from PACU, sleepy, PcA pump in use for pain management, CT to -20cm sx, resumed regular diet, no pain, no nausea issues. Focus on diet and pain management, focus on mobility when patient is more alert.    Problem: Adult Inpatient Plan of Care  Goal: Plan of Care Review  Outcome: Progressing  Goal: Patient-Specific Goal (Individualization)  Outcome: Progressing  Goal: Absence of Hospital-Acquired Illness or Injury  Outcome: Progressing  Goal: Optimal Comfort and Wellbeing  Outcome: Progressing  Goal: Readiness for Transition of Care  Outcome: Progressing  Goal: Rounds/Family Conference  Outcome: Progressing     Problem: Wound  Goal: Optimal Wound Healing  Outcome: Progressing

## 2020-05-21 NOTE — Unmapped (Signed)
Neuro: WDL    Cardiac: NS    Respiratory: diminished    GI: BM PTA    GU: voiding    MSK: generalized weakness    Skin: surgical incision    Pain: complains of pain, relieved with PRN regimen    Problem: Adult Inpatient Plan of Care  Goal: Plan of Care Review  Outcome: Ongoing - Unchanged  Goal: Patient-Specific Goal (Individualization)  Outcome: Ongoing - Unchanged  Goal: Absence of Hospital-Acquired Illness or Injury  Outcome: Ongoing - Unchanged  Goal: Optimal Comfort and Wellbeing  Outcome: Ongoing - Unchanged  Goal: Readiness for Transition of Care  Outcome: Ongoing - Unchanged  Goal: Rounds/Family Conference  Outcome: Ongoing - Unchanged     Problem: Wound  Goal: Optimal Wound Healing  Outcome: Ongoing - Unchanged

## 2020-05-22 MED ADMIN — gabapentin (NEURONTIN) capsule 300 mg: 300 mg | ORAL | @ 14:00:00

## 2020-05-22 MED ADMIN — ketorolac (TORADOL) injection 15 mg: 15 mg | INTRAVENOUS | @ 14:00:00 | Stop: 2020-05-23

## 2020-05-22 MED ADMIN — heparin (porcine) 5,000 unit/mL injection 5,000 Units: 5000 [IU] | SUBCUTANEOUS | @ 19:00:00

## 2020-05-22 MED ADMIN — heparin (porcine) 5,000 unit/mL injection 5,000 Units: 5000 [IU] | SUBCUTANEOUS | @ 02:00:00

## 2020-05-22 MED ADMIN — acetaminophen (TYLENOL) tablet 1,000 mg: 1000 mg | ORAL | @ 07:00:00

## 2020-05-22 MED ADMIN — ipratropium-albuteroL (DUO-NEB) 0.5-2.5 mg/3 mL nebulizer solution 3 mL: 3 mL | RESPIRATORY_TRACT | @ 01:00:00

## 2020-05-22 MED ADMIN — gabapentin (NEURONTIN) capsule 300 mg: 300 mg | ORAL

## 2020-05-22 MED ADMIN — acetaminophen (TYLENOL) tablet 1,000 mg: 1000 mg | ORAL

## 2020-05-22 MED ADMIN — gabapentin (NEURONTIN) capsule 300 mg: 300 mg | ORAL | @ 19:00:00

## 2020-05-22 MED ADMIN — docusate sodium (COLACE) capsule 100 mg: 100 mg | ORAL | @ 14:00:00

## 2020-05-22 MED ADMIN — ondansetron (ZOFRAN) injection 4 mg: 4 mg | INTRAVENOUS | @ 08:00:00

## 2020-05-22 NOTE — Unmapped (Signed)
Neuro: WDL    Cardiac: NSR    Respiratory: New exp wheeze. Pt becomes SOB with exertion. Notified [employer]. New orders placed for breathing treatment, with improvement.  Pt unable to be weaned off oxygen at this time. Pt desats to 80s on RA    GI: low appetite. BM PTA.    GU: trouble with voiding. Bladder scanned for 150 and then 350. Encouraged PO intake, however pt not drinking much. Notified [employer].     MSK: generalized weakness    Skin: surgical incisions    Pain: complains of pain, relieved with PRN    Problem: Adult Inpatient Plan of Care  Goal: Plan of Care Review  Outcome: Ongoing - Unchanged  Goal: Patient-Specific Goal (Individualization)  Outcome: Ongoing - Unchanged  Goal: Absence of Hospital-Acquired Illness or Injury  Outcome: Ongoing - Unchanged  Goal: Optimal Comfort and Wellbeing  Outcome: Ongoing - Unchanged  Goal: Readiness for Transition of Care  Outcome: Ongoing - Unchanged  Goal: Rounds/Family Conference  Outcome: Ongoing - Unchanged     Problem: Wound  Goal: Optimal Wound Healing  Outcome: Ongoing - Unchanged

## 2020-05-22 NOTE — Unmapped (Signed)
PHYSICAL THERAPY  Evaluation (05/22/20 1525)     Patient Name:  Frances Kennedy       Medical Record Number: [id number]   Date of Birth: [birth year]  Sex: Female                 Activity Tolerance: Tolerated treatment well    ASSESSMENT  Problem List: Decreased endurance, Decreased mobility, Pain     Assessment : Pt is a 54 yo F seen s/p L VATS on 7/16. Pt limited in today's session by dizziness, decreased endurance, and decreased O2 sats. Anticipate pt will progress with PT. Recommending 3x/week PT services upon discharge.     Today's Interventions: Pt ambulated 75 ft x2 with seated rest break using rollator with CGA. Educated pt and husband on role of PT in acute care, importance of mobility, upright adjustment strategies, deep breathing, 6 minute walk test.    Personal Factors/Comorbidities Present: 2   Specific Comorbidities : DM, invasive ductal carcinoma   Examination of Body System: 1-3 elements   Body System: MS, cardiopulmonary   Clinical Decision Making: Low     PLAN  Planned Frequency of Treatment:  1-2x per day for: 3-4x week      Planned Interventions: Diaphragmatic / Pursed-lip breathing, Education - Patient, Education - Family / caregiver, Functional mobility, Gait training, Endurance activities, Self-care / Home training, Therapeutic exercise, Therapeutic activity, Transfer training    Post-Discharge Physical Therapy Recommendations:  3x weekly    PT DME Recommendations:  (rollator)           Goals:   Patient and Family Goals: none stated.    Long Term Goal #1: In 1 month, pt will perform all household mobility mod I using LRAD.       SHORT GOAL #1: Pt will perform all functional transfers with LRAD and SBA.              Time Frame : 2 weeks  SHORT GOAL #2: Pt will ambulate 200 ft with LRAD and SBA.              Time Frame : 2 weeks                                                          Prognosis:  Good  Positive Indicators: age, family support, PLOF.  Barriers to Discharge: Endurance deficits SUBJECTIVE     Patient reports: Pt agreeable to therapy.  Current Functional Status: Pt up in recliner upon PT arrival.     Prior Functional Status: Pt was independent.  Equipment available at home: None     Past Medical History:   Diagnosis Date   ??? Breast cancer metastasized to axillary lymph node, left (CMS-HCC) 2019   ??? Pre-diabetes    ??? Sleep apnea     Social History     Tobacco Use   ??? Smoking status: Never Smoker   ??? Smokeless tobacco: Never Used   Substance Use Topics   ??? Alcohol use: Yes     Comment: Very rarely      Past Surgical History:   Procedure Laterality Date   ??? HYSTERECTOMY  2016   ??? IR INSERT PORT AGE GREATER THAN 5 YRS  05/02/2020    IR INSERT PORT AGE GREATER THAN 5 YRS 05/02/2020 Laneta Simmers  Erven Colla, MD IMG VIR [facility]   ??? KNEE ARTHROSCOPY     ??? mastopexy Bilateral 2004    Family History   Problem Relation Age of Onset   ??? Stroke Mother    ??? Stroke Father    ??? No Known Problems Brother    ??? Breast cancer Paternal Grandmother 74   ??? No Known Problems Brother    ??? No Known Problems Brother    ??? Breast cancer Paternal Aunt 58   ??? No Known Problems Son    ??? No Known Problems Son    ??? No Known Problems Son    ??? No Known Problems Daughter    ??? No Known Problems Daughter    ??? No Known Problems Daughter    ??? Melanoma Neg Hx    ??? Basal cell carcinoma Neg Hx    ??? Squamous cell carcinoma Neg Hx    ??? Pancreatic cancer Neg Hx    ??? Prostate cancer Neg Hx    ??? Uterine cancer Neg Hx    ??? Ovarian cancer Neg Hx         Allergies: Patient has no known allergies.                Objective Findings  Precautions / Restrictions  Precautions: Non-applicable  Weight Bearing Status: Non-applicable  Required Braces or Orthoses: Non-applicable        Pain Comments: Pt reporting 7/10 pain. RN gave pain medication prior to PT session.  Medical Tests / Procedures: 7/16 lymphadectomy, L VATS  Equipment / Environment: Supplemental oxygen, Telemetry, Vascular access (PIV, TLC, Port-a-cath, PICC) (1L [place] O2.)    At Rest: SpO2 97% on 1 L [place] O2.  With Activity: Desats on RA to 87% with minimal activity.          Living Situation  Living Environment: House  Lives With: Spouse, Family  Home Living: One level home, Stairs to enter with rails, Ramped entrance  Rail placement (outside): Bilateral rails  Number of Stairs to Enter (outside): 2     Cognition  Cognition: Within Functional Limits          UE ROM / Strength  UE ROM/Strength: Left Intact, Right Intact  LE ROM / Strength  LE ROM/Strength: Left Intact, Right Intact           Sensation: intact            Bed Mobility: not addressed this session.  Transfers: Sit to stand from recliner x2 and from rollator with min assist. On first stand attempt, pt reporting dizziness and O2 sat dropped 87% on RA without ambulation. Seated rest break needed.   Gait  Level of Assistance: Contact guard assist, steadying assist  Gait: pt ambulated 75 ft x2 with Rollator and CGA.              Physical Therapy Session Duration  PT Individual [mins]: 45    Medical Staff Made Aware: RN Lucy    I attest that I have reviewed the above information.  Signed: Janetta Hora, PT  Filed 05/22/2020

## 2020-05-22 NOTE — Unmapped (Signed)
Thoracic Surgery Progress Note  Length of Stay: 2  Post-op Day: 2 Days Post-Op    CT output (last 24/last shift/yesterday): 80/80/NA  Chest Tube Status: Remove today  Foley Status: No  Observed IS Volume: 250      Assessment/Plan:  Active Problems:    * No active hospital problems. *         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 PET/CT showed pet-avid mediastinal lymphnodes concerning for more advanced metastatic disease. She is now s/p L VATS for mediastinal lymphnode biopsy for appropriate staging.       Neuro: dPCA, gabapentin, acetaminophen, oxycodone PRN for pain. Tordol added    Card: HDS, slight HTN to 160s  - NTD      Pulm:   -CT removed yesterday 7/18, with improvement in discomfort  -IS, ambulate, vest therapy  -Cxray today and tomorrow  -6 min walk test    FEN:   -Regular diet.     GI:   -Colace and miralax for bowel regimen  -Zofran PRN for nausea     GU:   -Continue to monitor UOP.     Diet:   -Regular     PPx:   -SQH    Dispo:   -SDS    Subjective:  Overnight patient had a desaturation episode in the high 80s. She was placed on 3L of oxygen and her sats improved to 95. She also had urinary retention. Saturday afternoon she Voided 125 and was bladder scanned for 600. She was I&O by nursing staff.    Objective:  BP 131/63  - Pulse 94  - Temp 36.9 ??C (Oral)  - Resp 12  - Ht 162.6 cm (5' 4.02)  - Wt (!) 109 kg (240 lb 4.8 oz)  - SpO2 95%  - BMI 41.23 kg/m??     Physical Exam:  General:  Well-developed, well-nourished, sitting up in bed in mild distress.  Neuro:          Awake, alert and oriented to person, place, and time.   Pulm:             Wheezing heard at left apex  Resp:  Non labored breathing on 2L.   Chest wall: L sided chest tube removed, dressing in place.   GI:   Abdomen soft, nontender, nondistended  Extremities: Warm and well-perfused. No pedal edema  Skin:   Incisions clean without discharge. Island dressing REMOVED.     Pertinent Labs: Lab results personally reviewed and reviewed by thoracic team.   Pertinent Imaging: Images personally reviewed and reviewed by thoracic team.

## 2020-05-23 LAB — PHOSPHORUS
PHOSPHORUS: 2.1 mg/dL — ABNORMAL LOW (ref 2.4–5.1)
Phosphate:MCnc:Pt:Ser/Plas:Qn:: 2.1 — ABNORMAL LOW

## 2020-05-23 LAB — CBC
HEMATOCRIT: 35.7 % — ABNORMAL LOW (ref 36.0–46.0)
HEMOGLOBIN: 11.5 g/dL — ABNORMAL LOW (ref 12.0–16.0)
MEAN CORPUSCULAR HEMOGLOBIN: 29.9 pg (ref 26.0–34.0)
MEAN CORPUSCULAR VOLUME: 92.7 fL (ref 80.0–100.0)
MEAN PLATELET VOLUME: 9.6 fL (ref 7.0–10.0)
PLATELET COUNT: 280 10*9/L (ref 150–440)
RED BLOOD CELL COUNT: 3.85 10*12/L — ABNORMAL LOW (ref 4.00–5.20)
RED CELL DISTRIBUTION WIDTH: 14.6 % (ref 12.0–15.0)
WBC ADJUSTED: 10 10*9/L (ref 4.5–11.0)

## 2020-05-23 LAB — BASIC METABOLIC PANEL
BLOOD UREA NITROGEN: 12 mg/dL (ref 9–23)
BUN / CREAT RATIO: 13
CALCIUM: 9.3 mg/dL (ref 8.7–10.4)
CHLORIDE: 100 mmol/L (ref 98–107)
CO2: 32 mmol/L — ABNORMAL HIGH (ref 20.0–31.0)
CREATININE: 0.96 mg/dL — ABNORMAL HIGH
EGFR CKD-EPI AA FEMALE: 76 mL/min/{1.73_m2} (ref >=60)
GLUCOSE RANDOM: 145 mg/dL (ref 70–179)
POTASSIUM: 3.8 mmol/L (ref 3.4–4.5)
SODIUM: 139 mmol/L (ref 135–145)

## 2020-05-23 LAB — CO2: Carbon dioxide:SCnc:Pt:Ser/Plas:Qn:: 32 — ABNORMAL HIGH

## 2020-05-23 LAB — MAGNESIUM: Magnesium:MCnc:Pt:Ser/Plas:Qn:: 2.6

## 2020-05-23 LAB — RED CELL DISTRIBUTION WIDTH: Lab: 14.6

## 2020-05-23 MED ADMIN — magnesium hydroxide (MILK OF MAGNESIA) oral suspension: 30 mL | ORAL | @ 09:00:00

## 2020-05-23 MED ADMIN — ketorolac (TORADOL) injection 15 mg: 15 mg | INTRAVENOUS | @ 03:00:00 | Stop: 2020-05-23

## 2020-05-23 MED ADMIN — polyethylene glycol (MIRALAX) packet 17 g: 17 g | ORAL | @ 13:00:00

## 2020-05-23 MED ADMIN — docusate sodium (COLACE) capsule 100 mg: 100 mg | ORAL | @ 13:00:00

## 2020-05-23 MED ADMIN — magnesium citrate solution 296 mL: 296 mL | ORAL | @ 13:00:00 | Stop: 2020-05-23

## 2020-05-23 MED ADMIN — bisacodyL (DULCOLAX) suppository 10 mg: 10 mg | RECTAL | @ 19:00:00 | Stop: 2020-05-23

## 2020-05-23 MED ADMIN — acetaminophen (TYLENOL) tablet 1,000 mg: 1000 mg | ORAL | @ 19:00:00

## 2020-05-23 MED ADMIN — oxyCODONE (ROXICODONE) immediate release tablet 10 mg: 10 mg | ORAL | Stop: 2020-06-03

## 2020-05-23 MED ADMIN — acetaminophen (TYLENOL) tablet 1,000 mg: 1000 mg | ORAL

## 2020-05-23 MED ADMIN — oxyCODONE (ROXICODONE) immediate release tablet 10 mg: 10 mg | ORAL | @ 16:00:00 | Stop: 2020-06-03

## 2020-05-23 MED ADMIN — ondansetron (ZOFRAN) injection 4 mg: 4 mg | INTRAVENOUS | @ 01:00:00

## 2020-05-23 MED ADMIN — heparin (porcine) 7,500 units/0.75 mL syringe: 7500 [IU] | SUBCUTANEOUS | @ 19:00:00

## 2020-05-23 MED ADMIN — docusate sodium (COLACE) capsule 100 mg: 100 mg | ORAL

## 2020-05-23 MED ADMIN — ketorolac (TORADOL) injection 15 mg: 15 mg | INTRAVENOUS | @ 09:00:00 | Stop: 2020-05-23

## 2020-05-23 MED ADMIN — HYDROmorphone (DILAUDID) 50mg/50ml (1mg/ml) PCA CADD: INTRAVENOUS | @ 03:00:00 | Stop: 2020-06-03

## 2020-05-23 NOTE — Unmapped (Signed)
Pt is A/Ox4, 1L o2 via [place], SR. Pt ambulated on the hall x2 , fatigue and SOB during walk noted, 6 minute walk pt desated to 87% on room air, pt sat down on the chair during walk due to dizziness hence 6 min unable to complete, O2 sats recovered to 90s on 2L. Urine output improved passing gas-still have poor apetite. Pain control improving, family at the bedside most of the day, emotional support provided.  Problem: Adult Inpatient Plan of Care  Goal: Plan of Care Review  Outcome: Progressing  Goal: Patient-Specific Goal (Individualization)  Outcome: Progressing  Goal: Absence of Hospital-Acquired Illness or Injury  Outcome: Progressing  Goal: Optimal Comfort and Wellbeing  Outcome: Progressing  Goal: Readiness for Transition of Care  Outcome: Progressing  Goal: Rounds/Family Conference  Outcome: Progressing     Problem: Wound  Goal: Optimal Wound Healing  Outcome: Progressing

## 2020-05-23 NOTE — Unmapped (Signed)
Thoracic Surgery Progress Note  Length of Stay: 3  Post-op Day: 3 Days Post-Op    CT output (last 24/last shift/yesterday): No tube in place    Chest Tube Status: Removed 05/22/20   Foley Status: No  Observed IS Volume: 250      Assessment/Plan:  Active Problems:    Lymphadenopathy, mediastinal     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 PET/CT showed pet-avid mediastinal lymphnodes concerning for more advanced metastatic disease. She is now s/p L VATS for mediastinal lymphnode biopsy for appropriate staging.     Neuro: Gabapentin, acetaminophen, oxycodone PRN for pain  Card: BP stable   Pulm: No CT    IS, ambulate, vest therapy   CXR today    Repeat 6 min walk today, prior 6 min walk stopped d/t SOB  GI: Add magnesium citrate to encourage BM    Colace and miralax for bowel regimen   Zofran PRN for nausea   GU: Continue to monitor UOP   Diet: Regular   PPx: SQH  Dispo: Home tomorrow    Subjective:    Yesterday evening, [name] states that she felt nauseous and had two episodes of emesis. She has been passing flatus, but has not yet had a BM. Also, attempted the 6 min walk yesterday evening and was unable to tolerate due to SOB. Her O2 sat reached 87% on room air during the walk, pt sat down on the chair during walk due to dizziness hence 6 min unable to complete, O2 sats recovered to 90s on 2L. She did not have any associated chest pain. Pain is also controlled with minimal discomfort today.     Objective:  BP 125/76  - Pulse 109  - Temp 36.8 ??C (98.2 ??F) (Oral)  - Resp 21  - Ht 162.6 cm (5' 4.02)  - Wt (!) 107.4 kg (236 lb 12.4 oz)  - SpO2 94%  - BMI 40.62 kg/m??     Physical Exam:  General:  Well-developed, well-nourished, sitting up in bed in no acute distress  Neuro:   Awake, alert and oriented to person, place, and time.   Resp:  Wheezing heard at left apex. Non labored breathing on 2L.  Chest wall: L sided dressing in place  GI:   Abdomen soft, nontender, nondistended  Extremities: Warm and well-perfused. No pedal edema  Skin:  Incisions dry with no swelling and minimal erythema. Island dressing removed.    Pertinent Labs: Lab results personally reviewed and reviewed by thoracic team.   Pertinent Imaging: Images personally reviewed and reviewed by thoracic team.    Note written by Dub Mikes, MS3  I attest that I have reviewed the medical student note and that the components of the history of the present illness, the physical exam, and the assessment and plan documented were performed by me or were performed in my presence by the student where I verified the documentation and performed (or re-performed) the exam and medical decision making.   Jodie Echevaria, CT Surgery PGY1

## 2020-05-23 NOTE — Unmapped (Signed)
Patient alert and oriented x4. VS stable, on room air. Pain controlled with PRN medications. PCA discontinued this morning. Walked x2 today thus far. 6 min walk completed - see nursing note for details. Patient updated on plan of care, will continue to monitor.     Problem: Adult Inpatient Plan of Care  Goal: Plan of Care Review  Outcome: Progressing  Goal: Patient-Specific Goal (Individualization)  Outcome: Progressing  Goal: Absence of Hospital-Acquired Illness or Injury  Outcome: Progressing  Goal: Optimal Comfort and Wellbeing  Outcome: Progressing  Goal: Readiness for Transition of Care  Outcome: Progressing  Goal: Rounds/Family Conference  Outcome: Progressing     Problem: Wound  Goal: Optimal Wound Healing  Outcome: Progressing     Problem: Fall Injury Risk  Goal: Absence of Fall and Fall-Related Injury  Outcome: Progressing

## 2020-05-23 NOTE — Unmapped (Signed)
Problem: Adult Inpatient Plan of Care  Goal: Plan of Care Review  Outcome: Progressing   Pt is A&O4, NSR on 2L O2 VSS. No BM overnight. Pain is well controlled with PRN pain meds. Pt is resting at this time.   Goal: Patient-Specific Goal (Individualization)  Outcome: Progressing  Goal: Absence of Hospital-Acquired Illness or Injury  Outcome: Progressing  Goal: Optimal Comfort and Wellbeing  Outcome: Progressing  Goal: Readiness for Transition of Care  Outcome: Progressing  Goal: Rounds/Family Conference  Outcome: Progressing     Problem: Wound  Goal: Optimal Wound Healing  Outcome: Progressing

## 2020-05-24 MED ORDER — DOCUSATE SODIUM 100 MG CAPSULE
ORAL_CAPSULE | Freq: Two times a day (BID) | ORAL | 0 refills | 30 days | Status: CP
Start: 2020-05-24 — End: 2020-06-23
  Filled 2020-05-24: qty 60, 30d supply, fill #0

## 2020-05-24 MED ORDER — GABAPENTIN 300 MG CAPSULE
ORAL_CAPSULE | Freq: Three times a day (TID) | ORAL | 0 refills | 30 days | Status: CP
Start: 2020-05-24 — End: 2020-06-23
  Filled 2020-05-24: qty 90, 30d supply, fill #0

## 2020-05-24 MED ORDER — ACETAMINOPHEN 500 MG TABLET
ORAL_TABLET | Freq: Four times a day (QID) | ORAL | 0 refills | 4 days | Status: CP
Start: 2020-05-24 — End: ?
  Filled 2020-05-24: qty 30, 4d supply, fill #0

## 2020-05-24 MED ORDER — POLYETHYLENE GLYCOL 3350 17 GRAM ORAL POWDER PACKET
PACK | Freq: Two times a day (BID) | ORAL | 0 refills | 7.00000 days | Status: CP
Start: 2020-05-24 — End: 2020-05-31
  Filled 2020-05-24: qty 14, 7d supply, fill #0

## 2020-05-24 MED ORDER — OXYCODONE 5 MG TABLET
ORAL_TABLET | ORAL | 0 refills | 4.00000 days | Status: CP | PRN
Start: 2020-05-24 — End: 2020-05-29
  Filled 2020-05-24: qty 20, 4d supply, fill #0

## 2020-05-24 MED ORDER — IBUPROFEN 600 MG TABLET
ORAL_TABLET | Freq: Four times a day (QID) | ORAL | 0 refills | 14 days | Status: CP
Start: 2020-05-24 — End: 2020-06-07
  Filled 2020-05-24: qty 56, 14d supply, fill #0

## 2020-05-24 MED ADMIN — acetaminophen (TYLENOL) tablet 1,000 mg: 1000 mg | ORAL | @ 12:00:00 | Stop: 2020-05-24

## 2020-05-24 MED ADMIN — potassium & sodium phosphates 250mg (PHOS-NAK/NEUTRA PHOS) packet 2 packet: 2 | ORAL | @ 14:00:00 | Stop: 2020-05-24

## 2020-05-24 MED ADMIN — ibuprofen (MOTRIN) tablet 600 mg: 600 mg | ORAL | @ 14:00:00 | Stop: 2020-05-24

## 2020-05-24 MED ADMIN — docusate sodium (COLACE) capsule 100 mg: 100 mg | ORAL | @ 02:00:00

## 2020-05-24 MED ADMIN — polyethylene glycol (MIRALAX) packet 17 g: 17 g | ORAL | @ 12:00:00 | Stop: 2020-05-24

## 2020-05-24 MED ADMIN — oxyCODONE (ROXICODONE) immediate release tablet 5 mg: 5 mg | ORAL | @ 16:00:00 | Stop: 2020-05-24

## 2020-05-24 MED ADMIN — SMOG ENEMA: 240 mL | RECTAL | @ 12:00:00 | Stop: 2020-05-24

## 2020-05-24 MED ADMIN — magnesium hydroxide (MILK OF MAGNESIA) oral suspension: 30 mL | ORAL | @ 08:00:00 | Stop: 2020-05-24

## 2020-05-24 MED ADMIN — acetaminophen (TYLENOL) tablet 1,000 mg: 1000 mg | ORAL | @ 02:00:00

## 2020-05-24 MED ADMIN — gabapentin (NEURONTIN) capsule 300 mg: 300 mg | ORAL | @ 18:00:00 | Stop: 2020-05-24

## 2020-05-24 MED ADMIN — gabapentin (NEURONTIN) capsule 300 mg: 300 mg | ORAL | @ 02:00:00

## 2020-05-24 MED FILL — ACETAMINOPHEN 500 MG TABLET: 4 days supply | Qty: 30 | Fill #0 | Status: AC

## 2020-05-24 MED FILL — OXYCODONE 5 MG TABLET: 4 days supply | Qty: 20 | Fill #0 | Status: AC

## 2020-05-24 MED FILL — GABAPENTIN 300 MG CAPSULE: 30 days supply | Qty: 90 | Fill #0 | Status: AC

## 2020-05-24 MED FILL — POLYETHYLENE GLYCOL 3350 17 GRAM ORAL POWDER PACKET: 7 days supply | Qty: 14 | Fill #0 | Status: AC

## 2020-05-24 MED FILL — DOCUSATE SODIUM 100 MG CAPSULE: 30 days supply | Qty: 60 | Fill #0 | Status: AC

## 2020-05-24 MED FILL — IBUPROFEN 600 MG TABLET: 14 days supply | Qty: 56 | Fill #0 | Status: AC

## 2020-05-24 NOTE — Unmapped (Signed)
Patient alert and oriented x4. VS stable, on RA to 2L when walking. Pain controlled with PRN medications. Enema given this morning, BM x2 today. Oxygen and rollator delivered to room. Discharge instructions provided to patient. All questions answered and reports understanding. Patient discharged off unit with all belongings by patient transport.     Problem: Adult Inpatient Plan of Care  Goal: Plan of Care Review  Outcome: Discharged to Home  Goal: Patient-Specific Goal (Individualization)  Outcome: Discharged to Home  Goal: Absence of Hospital-Acquired Illness or Injury  Outcome: Discharged to Home  Goal: Optimal Comfort and Wellbeing  Outcome: Discharged to Home  Goal: Readiness for Transition of Care  Outcome: Discharged to Home  Goal: Rounds/Family Conference  Outcome: Discharged to Home     Problem: Wound  Goal: Optimal Wound Healing  Outcome: Discharged to Home     Problem: Fall Injury Risk  Goal: Absence of Fall and Fall-Related Injury  Outcome: Discharged to Home     Problem: Self-Care Deficit  Goal: Improved Ability to Complete Activities of Daily Living  Outcome: Discharged to Home

## 2020-05-24 NOTE — Unmapped (Signed)
Care Management  Initial Transition Planning Assessment  CM met with patient in pt room.  Pt/visitors were not wearing hospital provided masks for the duration of the interaction with CM.   CM was wearing hospital provided surgical mask and hospital provided eye protection.  CM was within 6 foot of the patient/visitors during this interaction.             General  Care Manager assessed the patient by : In person interview with patient  Orientation Level: Oriented X4  Functional level prior to admission: Independent    Contact/Decision Maker  Extended Emergency Contact Information  Primary Emergency Contact: Lumbert,Jaquan  Mobile Phone: [phone]  Relation: Son  Secondary Emergency Contact: Clabe Seal  Mobile Phone: [phone]  Relation: Domestic Dentist Next of Kin / Guardian / POA / Advance Directives     Advance Directive (Medical Treatment)  Does patient have an advance directive covering medical treatment?: Patient does not have advance directive covering medical treatment.  Reason patient does not have an advance directive covering medical treatment:: Patient does not wish to complete one at this time.    Health Care Decision Maker [HCDM] (Medical & Mental Health Treatment)  Healthcare Decision Maker: Patient does not wish to appoint a Health Care Decision Maker at this time  Information offered on HCDM, Medical & Mental Health advance directives:: Patient declined information.    Advance Directive (Mental Health Treatment)  Does patient have an advance directive covering mental health treatment?: Patient does not have advance directive covering mental health treatment.  Reason patient does not have an advance directive covering mental health treatment:: Patient does not wish to complete one at this time.    Patient Information  Lives with: Spouse/significant other    Type of Residence: Private residence        Location/Detail: [address] [place], [place] [address] [facility]), [phone] (cell), [phone] (work), 2 STE without rail, ramp entry, no steps inside the home    Support Systems/Concerns: Significant Other, Children (Son: Junie Panning [phone], Sig other: Clabe Seal [phone])    Responsibilities/Dependents at home?: No    Home Care services in place prior to admission?: No          Outpatient/Community Resources in place prior to admission:  (none)       Equipment Currently Used at Home: none (no home O2)       Currently receiving outpatient dialysis?: No       Financial Information       Need for financial assistance?: No (pt works, she has [employer] as her insurance.)       Social Determinants of Health  Financial Resource Strain: Low Risk    ??? Difficulty of Paying Living Expenses: Not hard at all   Food Insecurity: No Food Insecurity   ??? Worried About Programme researcher, broadcasting/film/video in the Last Year: Never true   ??? Ran Out of Food in the Last Year: Never true   Transportation Needs: No Transportation Needs   ??? Lack of Transportation (Medical): No   ??? Lack of Transportation (Non-Medical): No     Housing Stability: Low Risk    ??? Within the past 12 months, have you ever stayed: outside, in a car, in a tent, in an overnight shelter, or temporarily in someone else's home (i.e. couch-surfing)?: No   ??? Are you worried about losing your housing?: No   ??? Within the past 12 months, have you been unable  to get utilities (heat, electricity) when it was really needed?: No         Discharge Needs Assessment  Concerns to be Addressed: denies needs/concerns at this time, denies drug and alcohol abuse. She doesn't smoke cigarettes.    Clinical Risk Factors: Multiple Diagnoses (Chronic) Reason for Admission: Admitting Diagnosis:  lymphadenopathy  Past Medical History:   has a past medical history of Breast cancer metastasized to axillary lymph node, left (CMS-HCC) (2019), Pre-diabetes, and Sleep apnea.  Past Surgical History:   has a past surgical history that includes mastopexy (Bilateral, 2004); Hysterectomy (2016); Knee arthroscopy; IR Insert Port Age Greater Than 5 Years (05/02/2020); and pr thorcoscpy w/mediastinl &regionl lymphadenectomy (Left, 05/20/2020).   Previous admit date: N/A    Barriers to taking medications: No (pt uses [employer] as rx coverage. She denies difficulty paying for her medications. She takes them as directed and doesn't have any questions about them. She doesn't use a pillbox.)Pt uses [facility] on [address] in [place]. She would like to have her new discharge medications filled at her pharmacy.     Prior overnight hospital stay or ED visit in last 90 days: No    Readmission Within the Last 30 Days: no previous admission in last 30 days    Patient's Choice of Community Agency(s): PCP: Shanda Bumps Copland who pt last saw June 2021. Pt drives herself to her appointments, but her sig other can assist with driving after discharge and drive pt home.    Anticipated Changes Related to Illness: none    Equipment Needed After Discharge: other (see comments) (rollator)    Discharge Facility/Level of Care Needs: other (see comments) (HH)    Readmission  Risk of Unplanned Readmission Score: UNPLANNED READMISSION SCORE: 12%  Predictive Model Details          12% (Medium)  Factor Value    Calculated 05/23/2020 16:05 18% Active NSAID Rx order present    [facility] Risk of Unplanned Readmission Model 14% Number of active Rx orders 22     9% Diagnosis of cancer present     9% Active antipsychotic Rx order present     8% ECG/EKG order present in last 6 months     6% Imaging order present in last 6 months     5% Latest hemoglobin low (11.5 g/dL)     5% Phosphorous result present     5% Charlson Comorbidity Index 5     4% Age 54     4% Active anticoagulant Rx order present     4% Active corticosteroid Rx order present     4% Latest creatinine high (0.83 mg/dL)     3% Current length of stay 3.311 days     2% Future appointment scheduled      Readmitted Within the Last 30 Days? (No if blank)   Patient at risk for readmission?: Yes    Discharge Plan  Screen findings are: Discharge planning needs identified or anticipated (Comment). [facility])    Expected Discharge Date: 05/23/2020    Expected Transfer from Critical Care:      Quality data for continuing care services shared with patient and/or representative?: Yes  Patient and/or family were provided with choice of facilities / services that are available and appropriate to meet post hospital care needs?: Yes (Pt would like a list of HH agencies to choose from if [facility] is needed. She was offered choice of HME agency if needed, but had no preference.)  Initial Assessment complete?: Yes  Albin Fischer, RN  Pager: [phone]

## 2020-05-24 NOTE — Unmapped (Signed)
Problem: Adult Inpatient Plan of Care  Goal: Plan of Care Review  Outcome: Progressing   Pt is A&O4, NSR, 1 L O2  VSS.  No BM overnight. Pain is well controlled with PRN pain meds. Pt is resting at this time.   Goal: Patient-Specific Goal (Individualization)  Outcome: Progressing  Goal: Absence of Hospital-Acquired Illness or Injury  Outcome: Progressing  Goal: Optimal Comfort and Wellbeing  Outcome: Progressing  Goal: Readiness for Transition of Care  Outcome: Progressing  Goal: Rounds/Family Conference  Outcome: Progressing     Problem: Wound  Goal: Optimal Wound Healing  Outcome: Progressing     Problem: Fall Injury Risk  Goal: Absence of Fall and Fall-Related Injury  Outcome: Progressing     Problem: Self-Care Deficit  Goal: Improved Ability to Complete Activities of Daily Living  Outcome: Progressing

## 2020-05-24 NOTE — Unmapped (Shared)
Thoracic Surgery Progress Note  Length of Stay: 4  Post-op Day: 4 Days Post-Op    CT output (last 24/last shift/yesterday): No tube in place    Chest Tube Status: Removed 05/22/20   Foley Status: No     Assessment/Plan:  Active Problems:    Lymphadenopathy, mediastinal     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 PET/CT showed pet-avid mediastinal lymphnodes concerning for more advanced metastatic disease. She is now s/p L VATS for mediastinal lymphnode biopsy for appropriate staging.     Neuro: Gabapentin, acetaminophen, oxycodone PRN for pain  Card: BP stable   Pulm: No CT    IS, ambulate  FEN:  IVs/lines to be removed before discharge   GI: Enema this AM to encourage BM    Colace and miralax for bowel regimen   Zofran PRN for nausea   GU: Continue to monitor UOP   Diet: Regular   PPx: SQH  Dispo: Home today with oxygen     Subjective:    Pain is also controlled with minimal discomfort today. Has not yet had a BM.     Objective:  BP 149/68  - Pulse 97  - Temp 36.8 ??C (98.2 ??F) (Oral)  - Resp 14  - Ht 162.6 cm (5' 4.02)  - Wt (!) 108.6 kg (239 lb 6.7 oz)  - SpO2 93%  - BMI 41.08 kg/m??     Physical Exam:  General:  Well-developed, well-nourished, sitting up in bed in no acute distress  Neuro:   Awake, alert and oriented to person, place, and time.   Resp:  Non labored breathing on 2L.  Chest wall: L sided dressing in place  GI:   Abdomen soft, nontender, nondistended  Extremities: Warm and well-perfused. No pedal edema  Skin:  Incisions dry with no swelling and minimal erythema. Island dressing removed.    Pertinent Labs: Lab results personally reviewed and reviewed by thoracic team.   Pertinent Imaging: Images personally reviewed and reviewed by thoracic team.    Note written by Dub Mikes, MS3

## 2020-05-24 NOTE — Unmapped (Cosign Needed)
Discharge Summary    Admit date: 05/20/2020    Discharge date: 05/24/2020    Discharge to:  Home    Discharge Service: Surg Thoracic (SRT)    Discharge Attending Physician: Monico Hoar, MD    Discharge Diagnoses: Lymphadenopathy    Secondary Diagnosis: Active Problems:    Lymphadenopathy, mediastinal      OR Procedures:    Left - THORACOSCOPY, SURGICAL; WITH MEDIASTINAL & REGIONAL LYMPHADENECTOMY  Date  05/20/2020  -------------------     Ancillary Procedures: no procedures    Discharge Day Services: The patient was seen and examined by the Thoracic Surgery team on the day of discharge. Vital signs and laboratory values were stable and within normal limits. Surgical wounds were inspected and found to be consistent with the performed procedure. Discharge plan was discussed, instructions were given, and all questions answered.    Subjective  No acute events overnight. Pain adequately controlled.     Objective   Patient Vitals for the past 8 hrs:   BP Temp Temp src Pulse Resp SpO2   05/24/20 1200 140/89 36.8 ??C (98.2 ??F) Oral 87 16 94 %   05/24/20 0715 149/68 36.8 ??C (98.2 ??F) Oral 97 14 93 %     I/O this shift:  In: 500 [P.O.:500]  Out: -     Physical Exam  General: Well-developed, well-nourished female sitting up in bed in no acute distress.   Neuro: Awake, alert and oriented to person, place, and time  CV: Normal rate, regular rhythm  Pulm: Nonlabored breathing on room air  GI: Abdomen soft, nontender, nondistended  Extremities: Warm, perfused  Skin: Left VATS incisions clean, dry, and intact with approximated edges. Prior chest tube site with sutures in place.       Hospital Course:   Frances Hashimoto??Kennedy??is a 54 y.o.??African American??female??never-smoker and a history of??diabetes (on metformin).??In May of this year she self-identified a left breast mass. Biopsies performed on April 14, 2020 showed the left breast mass to be invasive ductal carcinoma with metastasis to a left axillary lymph node. A CT scan performed on May 03, 2020 identified enlarged anterior mediastinal (prevascular and left internal mammary) lymph node which were also avid on PET scan performed May 13, 2020. On May 20, 2020 she underwent left video assisted thoracoscopic surgery with lymph node biopsies. She tolerated the procedure well, was extubated in the operating room, and was taken to the recovery room where she received routine postoperative care until transfer to the floor. She progressed well. The chest tube was removed on POD1. She was noted to have urinary retention on serial bladder scans on POD0-1, however was able to void spontaneously. She remained inpatient in order to achieve adequate control of her pain and nausea. Due to a persistent oxygen requirement a 6-minute walk was performed on the day prior to discharge showing a need for exertional oxygen.   By discharge Frances Kennedy was hemodynamically stable on room air at rest/2L with ambulation, tolerating a regular diet, voiding spontaneously, passing flatus, ambulating in the hallways, and her pain was controlled with oral agents. She will be discharged with scheduled follow-up in the thoracic surgery clinic.     Condition at Discharge: Good  Discharge Medications:      Your Medication List      STOP taking these medications    dexAMETHasone 4 MG tablet  Commonly known as: DECADRON     hydrOXYzine 10 MG tablet  Commonly known as: ATARAX     LORazepam 0.5  MG tablet  Commonly known as: ATIVAN     ondansetron 4 MG tablet  Commonly known as: ZOFRAN     prochlorperazine 10 MG tablet  Commonly known as: COMPAZINE     tretinoin 0.025 % cream  Commonly known as: RETIN-A        START taking these medications    acetaminophen 500 MG tablet  Commonly known as: TYLENOL  Take 2 tablets (1,000 mg total) by mouth Every six (6) hours.     docusate sodium 100 MG capsule  Commonly known as: COLACE  Take 1 capsule (100 mg total) by mouth Two (2) times a day.     gabapentin 300 MG capsule  Commonly known as: NEURONTIN  Take 1 capsule (300 mg total) by mouth Three (3) times a day.     oxyCODONE 5 MG immediate release tablet  Commonly known as: ROXICODONE  Take 1 tablet (5 mg total) by mouth every four (4) hours as needed for up to 5 days.     polyethylene glycol 17 gram packet  Commonly known as: MIRALAX  Dissolve 1 capful (17 g) in 4 to 8 oz of liquid and take by mouth Two (2) times a day for 7 days.        CHANGE how you take these medications    ibuprofen 600 MG tablet  Commonly known as: MOTRIN  Take 1 tablet (600 mg total) by mouth Every six (6) hours for 14 days.  What changed:   ?? medication strength  ?? See the new instructions.        CONTINUE taking these medications    metFORMIN 500 MG tablet  Commonly known as: GLUCOPHAGE  Take 500 mg by mouth.            Pending Test Results: Final surgical pathology    Discharge Instructions:  Activity:   Activity Instructions     Activity as tolerated      Lifting restrictions (specify)      Do not lift more than 10 pounds until you are cleared to do so in clinic.    OK to shower (no bath)      Patient may shower 48 hours after surgery, but should not immerse wounds for 2 - 3 weeks. Wash the surgical site with mild soap and water, but do not scrub.    Other restrictions (specify):      Do not drive or operate machinery while taking narcotic pain medications (oxycodone, Percocet, Vicodin, Norco, etc).        Diet:  Diet Instructions     Discharge diet (specify)      Discharge Nutrition Therapy: Regular        Other Instructions:  Other Instructions     Call MD for:      Call the Thoracic Surgery clinic at [phone] or proceed to the nearest Emergency Department for fever >101.82F by mouth, uncontrolled nausea or vomiting, pain uncontrolled with prescribed medication, or inablily to pass stool for 3 days or more; also call for new or worsening shortness of breath or for any other new or concerning symptoms.    For 3-4 days following general anesthesia, it is normal to cough up small volumes of dark, bloody sputum; call for cough productive of foul-smelling sputum or large amounts of bright-red blood.    Inspect surgical sites at least twice daily; call clinic for purulent discharge, or increasing bleeding or drainage, or for separation of wounds.  Call the Thoracic Surgery clinic at [phone] or proceed to the nearest Emergency Department for fever >101.54F by mouth, uncontrolled nausea or vomiting, pain uncontrolled with prescribed medication, or inablily to pass stool for 3 days or more; also call for new or worsening shortness of breath or for any other new or concerning symptoms.    For 3-4 days following general anesthesia, it is normal to cough up small volumes of dark, bloody sputum; call for cough productive of foul-smelling sputum or large amounts of bright-red blood.    Inspect surgical sites at least twice daily; call clinic for purulent discharge, or increasing bleeding or drainage, or for separation of wounds.    Discharge instructions      Continue directed cough and incentive spirometry as instructed by nursing until directed to stop at follow up in Thoracic Surgery clinic.    The Thoracic Surgery clinic should contact you regarding the details of your follow-up appointment. Please call the clinic at [phone] if you do not hear from them within ~1 week. You will have a follow up appointment with Dr. Juliene Pina in the next 2 weeks with a chest x-ray.         Continue directed cough and incentive spirometry as instructed by nursing until directed to stop at follow up in Thoracic Surgery clinic.    The Thoracic Surgery clinic should contact you regarding the details of your follow-up appointment. Please call the clinic at [phone] if you do not hear from them within ~1 week. You will have a follow up appointment with Dr. Juliene Pina in the next 2 weeks with a chest x-ray.        Labs and Other Follow-ups after Discharge:  Follow Up instructions and Outpatient Referrals     Ambulatory referral to Home Health      Is this a [facility] or [facility] Patient?: No    Physician to follow patient's care: Other: (please enter in comments) Comment - Dr. Rosebud Poles with Cardiothoracic Surgery (phone: [phone], fax: [phone])    Disciplines requested:  Nursing  Physical Therapy       Nursing requested: Other: (please enter in comments)    Physical Therapy requested:  Home safety evaluation  Evaluate and treat       Requested start of care date: Routine (within 48 hours)    HH RN:  1. VS and O2 saturation assessment  2. Reconcile discharge/home medications  3. Teach and reinforce medication management  4. Any new medication teaching and reinforcement    5. Any teaching and reinforcement of disease management    6. Assess wound for signs and symptoms of infection (purulent drainage, erythema, wound dehiscence)    Face to face:  I certify that Nolon Nations is confined to his/her home and needs intermittent skilled nursing care, physical therapy and/or speech therapy or continues to need occupational therapy. The patient is under my care, and I have authorized services on this plan of care and will periodically review the plan. The patient had a face-to-face encounter with an allowed provider type on 05/24/2020 and the encounter was related to the primary reason for home health care.    Call MD for:      Call the Thoracic Surgery clinic at [phone] or proceed to the nearest Emergency Department for fever >101.54F by mouth, uncontrolled nausea or vomiting, pain uncontrolled with prescribed medication, or inablily to pass stool for 3 days or more; also call for new or worsening shortness  of breath or for any other new or concerning symptoms.    For 3-4 days following general anesthesia, it is normal to cough up small volumes of dark, bloody sputum; call for cough productive of foul-smelling sputum or large amounts of bright-red blood.    Inspect surgical sites at least twice daily; call clinic for purulent discharge, or increasing bleeding or drainage, or for separation of wounds.    Discharge instructions      Continue directed cough and incentive spirometry as instructed by nursing until directed to stop at follow up in Thoracic Surgery clinic.    The Thoracic Surgery clinic should contact you regarding the details of your follow-up appointment. Please call the clinic at [phone] if you do not hear from them within ~1 week. You will have a follow up appointment with Dr. Juliene Pina in the next 2 weeks with a chest x-ray.        Resources and Referrals     Oxygen      Oxygen Type: Exertion    % saturation on room air at rest: 99    % sat during exertion/exercise on Room Air: 86    % saturation during exertion on oxygen: 98    Liters/Minute during exertion: 2    Type of portable O2 tank and concentrator to deliver: Gaseous    Oxygen device delivery method: Nasal Cannula    Other Orders: Optional Systems for Oxygen delivery    Optional System for Delivery: Eval for Oxygen Conserving Device System    Provide: Lightweight portable tank, carry bag, appropriate conserving device    Maintain SaO2 (greater than or = to)%: 90    Length of Need: 6 months    Qualifying SAT levels must be within 48 hours of discharge    Oxygen saturation obtained 05/23/20    Additional Information:  Please deliver portable oxygen tanks to patient's room and concentrator to home.         Qualifying SAT levels must be within 48 hours of discharge    Oxygen saturation obtained 05/23/20    Additional Information:  Please deliver portable oxygen tanks to patient's room and concentrator to home.    Walker      Wheels: 4 Wheels & Seat    Length of Need: 99 months        Future Appointments:  Appointments which have been scheduled for you    May 26, 2020  7:00 AM  (Arrive by 6:30 AM)  NURSE LAB DRAW with ADULT ONC LAB  [facility] ADULT ONCOLOGY LAB DRAW [place] Laniya Friedl [facility] REGION) [address] [place] [address]  [phone]      May 26, 2020  8:00 AM  (Arrive by 7:30 AM)  RETURN ACTIVE [place] with Gwinda Passe, MD  [facility] HEMATOLOGY ONCOLOGY 2ND FLR CANCER HOSP [facility] REGION) [address]  [place] [address]  [phone]      May 26, 2020  9:00 AM  (Arrive by 8:30 AM)  LEVEL 150 with [employer] CHAIR 02  [place] ONCOLOGY INFUSION [place] Syliva Mee [facility] REGION) [address]  [place] [address]  [phone]      Jun 02, 2020 10:30 AM  (Arrive by 10:00 AM)  XR CHEST PA AND LATERAL with Toney Reil RM 11  IMG DIAG X-RAY [facility]) [address]  [place] [address]  [phone]      Jun 02, 2020 11:00 AM  [place]  by 10:30 AM)  Danelle Earthly POST OP with Lenor Derrick, AGNP  [facility] SURGERY ONCOLOGY [place] [facility] REGION) [address]  [place] [address]  [phone]

## 2020-05-25 NOTE — Unmapped (Signed)
Called Frances Kennedy to discuss pathology report showing cancer in internal mammary node but not mediastinal LN.    Will proceed with NA chemotherapy as planned, C1 tomorrow.    Patient reports severe constipation with associated gas and abdominal pain since leaving the hospital. She is taking Miralax BID and Colace BID. No longer requiring opiates for pain.  - Advised increasing Miralax to QID, encouraged fluid intake and ambulation; will reassess during clinic visit tomorrow.

## 2020-05-26 ENCOUNTER — Other Ambulatory Visit: Admit: 2020-05-26 | Discharge: 2020-05-27 | Payer: PRIVATE HEALTH INSURANCE

## 2020-05-26 ENCOUNTER — Ambulatory Visit
Admit: 2020-05-26 | Discharge: 2020-05-27 | Payer: PRIVATE HEALTH INSURANCE | Attending: Hematology & Oncology | Primary: Hematology & Oncology

## 2020-05-26 DIAGNOSIS — Z170 Estrogen receptor positive status [ER+]: Secondary | ICD-10-CM

## 2020-05-26 DIAGNOSIS — C50212 Malignant neoplasm of upper-inner quadrant of left female breast: Principal | ICD-10-CM

## 2020-05-26 LAB — HEPATIC FUNCTION PANEL
ALKALINE PHOSPHATASE: 132 U/L — ABNORMAL HIGH (ref 46–116)
ALT (SGPT): 44 U/L (ref 10–49)
AST (SGOT): 41 U/L — ABNORMAL HIGH (ref <=34)
BILIRUBIN DIRECT: <0.1 mg/dL (ref 0.00–0.30)
BILIRUBIN TOTAL: 0.3 mg/dL (ref 0.3–1.2)

## 2020-05-26 LAB — CBC W/ AUTO DIFF
BASOPHILS ABSOLUTE COUNT: 0 10*9/L (ref 0.0–0.1)
EOSINOPHILS RELATIVE PERCENT: 0.9 %
HEMATOCRIT: 36.3 % (ref 36.0–46.0)
HEMOGLOBIN: 11.6 g/dL — ABNORMAL LOW (ref 12.0–16.0)
LYMPHOCYTES ABSOLUTE COUNT: 1.9 10*9/L (ref 1.5–5.0)
MEAN CORPUSCULAR HEMOGLOBIN CONC: 32 g/dL (ref 31.0–37.0)
MEAN CORPUSCULAR HEMOGLOBIN: 28.9 pg (ref 26.0–34.0)
MEAN CORPUSCULAR VOLUME: 90.3 fL (ref 80.0–100.0)
MEAN PLATELET VOLUME: 8.4 fL (ref 7.0–10.0)
MONOCYTES ABSOLUTE COUNT: 0.5 10*9/L (ref 0.2–0.8)
MONOCYTES RELATIVE PERCENT: 7.5 %
NEUTROPHILS ABSOLUTE COUNT: 4.4 10*9/L (ref 2.0–7.5)
NEUTROPHILS RELATIVE PERCENT: 61.5 %
PLATELET COUNT: 315 10*9/L (ref 150–440)
RED BLOOD CELL COUNT: 4.02 10*12/L (ref 4.00–5.20)
RED CELL DISTRIBUTION WIDTH: 13.9 % (ref 12.0–15.0)
WBC ADJUSTED: 7.1 10*9/L (ref 4.5–11.0)

## 2020-05-26 LAB — NEUTROPHILS RELATIVE PERCENT: Neutrophils/100 leukocytes:NFr:Pt:Bld:Qn:Automated count: 61.5

## 2020-05-26 LAB — ESTRADIOL LEVEL: Estradiol:MCnc:Pt:Ser/Plas:Qn:: 175.4

## 2020-05-26 LAB — FOLLICLE STIMULATING HORMONE: Follitropin:ACnc:Pt:Ser/Plas:Qn:: 31.8

## 2020-05-26 LAB — ALT (SGPT): Alanine aminotransferase:CCnc:Pt:Ser/Plas:Qn:: 44

## 2020-05-26 LAB — LUTEINIZING HORMONE: Lutropin:ACnc:Pt:Ser/Plas:Qn:: 26.2

## 2020-05-26 NOTE — Unmapped (Addendum)
For constipation:  - Continue Miralax three times daily  - Continue Colace twice daily  - Start Magnesium citrate 1 bottle once daily until achieve 1 normal bowel movement per day  - Call us or Dr. Camillia Herter team if no relief within 24 hours    I recommend that you receive the COVID vaccine if you have not yet received it.    For more information regarding receiving the COVID-19 vaccine at [facility], go to:  [email or web address]  or call [phone] (M-F, 8 am-5pm)    For information regarding [place] priority groups, go to:  [email or web address]    ------------------------    For appointments & urgent questions Monday through Friday 8 AM???5 PM:  Please call [phone] or Toll free [phone]    Your medical oncologist is: Dr. Wendall Papa  Your medical oncology nurse practioner is: Lubertha Basque, NP  Your nurse navigator is: Dan Maker, RN  Your clinical pharmacist is: Dr. Raelene Bott    For urgent clinical needs on Nights, Weekends or Holidays:  Call [phone] and ask for the oncologist on call.   For appointment changes, please contact us during normal business hours, as we do not have staff to assist with scheduling after hours.     You can also message your team using [email or web address]. This is for non-urgent messages only. It may take 24-48 hours for your team to answer you. For non-urgent questions, please consider waiting until your next clinic visit to discuss with your medical team.    ---------------------  Please visit [email or web address], a resource created just for family members and caregivers.  This website lists support services, how and where to ask for help. It has tools to assist you as you help [place] care for your loved one.      [facility]  [address]  [place], [place] [address]  [email or web address]

## 2020-05-26 NOTE — Unmapped (Signed)
Breast Oncology Clinic    PCP: Pearline Cables, MD    Multidisciplinary team:  Surgical Oncology: Sherilyn Cooter, MD  Radiation Oncology: Thom Chimes, MD / Konrad Penta, NP  Plastic Surgery: None at this time  Genetics: None at this time    Reason for Visit: management of breast cancer    Assessment/Plan:    [name] is a 54 y.o. woman with prediabetes and left breast cancer.    # Breast cancer, left, invasive ductal carcinoma grade 3, ER positive 5%, PR negative, HER-2 negative, clinical T1cN3 (biopsy-proven left internal mammary node; mediastinal biopsy was negative)    We reviewed her clinical history, imaging, and pathology and discussed the importance of breast cancer receptors for determining systemic therapy.  We discussed that her breast cancer is technically ER positive, although I would treat it according to a triple negative paradigm given the low receptor expression.     [name] underwent surgical evaluation of abnormal appearing mediastinal lymph nodes, and biopsy was negative for metastatic disease.  She was confirmed to have involvement of the left internal mammary node.  We will proceed with curative intent therapy using neoadjuvant dose dense ACT.    I would not add carboplatin given its lack of proven survival benefit and the fact that patients with ER positive tumors were not included in the clinical trial demonstrating its benefit.    Port has been placed and baseline echocardiogram shows ejection fraction 60 to 65% and no significant valvular abnormalities.    Patient was recently discharged from the hospital after VATS procedure for lymph node biopsies.  She has been constipated for 1 week likely exacerbated by use of oral narcotics for pain control.  In addition, she failed 6-walking test , CXR showed left hemidiaphragm elevation so that she was discharged on supplemental oxygen.    We will postpone chemotherapy for 2 weeks to give enough time to be medically optimized regarding constipation and left basilar atelectasis.      Planned Treatment:  - doxorubicin 60 mg/m2 IV on day 1  - cyclophosphamide 600 mg/m2 IV on day 1  * cycled every 14 days for 4 cycles  * growth factor support with each cycle, per NCCN guidelines     Followed by:  - weekly paclitaxel 80 mg/m2 IV for 12 weeks       Adjuvant therapy: We discussed that we would recommend adjuvant capecitabine if she has residual disease following neoadjuvant chemotherapy.  We also discussed that we may recommend adjuvant endocrine therapy, although its utility in the presence of ER +5% and high Ki67 is not entirely clear.  Will discuss at future visits in greater depth.     # Genetics  Patient had genetic testing with breast cancer stat panel with add-on ATM and CHEK2 gene sent to [facility].  She had a negative result.    # Bone health  Defer DEXA for now; can reconsider if giving adjuvant AI.    # Fertility  Patient is status post hysterectomy.    # Supportive care  -Constipation: No BM for 1 week, likely related to recent use of oral narcotics for pain control, prescribed magnesium citrate.  She was instructed to call us tomorrow to give [place] an update regarding constipation and abdominal distention.  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 Recommend ambulation.  - Psychosocial: Patient feels like she is coping generally well and denies any need for  CCSP at this time.     # Goals of Care:  - Discussion of an advanced directive: Not addressed  - Patient preferences: To receive care in [place] in [place], to start treatment promptly.  - Support systems: Husband  - Palliative care referral: No immediate indication    # Caregiver support:  The patient's husband is her primary caregiver / support person. At this time, she remains fully independent in ADLs.     # COVID-19 precautions  [name] has received the COVID-19 vaccine, 2nd dose 02/19/20.     Follow up:   RTC 2 weeks for initiation of Chemotherapy    Patient was seen and examined with the attending, Dr Rosalia Hammers.    Oswaldo Conroy, MD MS  PGY-4 Hematology Oncology        --------------------------    History:  [name] is a 54 y.o. woman with prediabetes who presents for evaluation of breast cancer. I have reviewed her records including history, imaging, pathology reports, and, when applicable, operative notes and summarized her oncologic history below:    Oncology History Overview Note   2021: Left breast cT1c cN1, IDC, G3, weakly ER+ (5%), PR-, HER2-     Malignant neoplasm of upper-inner quadrant of left female breast (CMS-HCC)    - 03/29/2020 Presenting Symptoms    Self-palpated left breast mass      03/29/2020 Interval Scan(s)    MMG with tomo: asymmetric density UIQ of left breast. Additional oval, circumscribed mass in the lateral left breast, middle depth. Right breast, multiple round circumscribed masses scattered centrally.     [place]: Left breast: irregular, hypoechoic mass, 10:00, 1.7 x 1.7 x 1.5 cm. Additional oval, circumscribed anechoic mass, 2:00 5 CFN measuring 0.4 x 0.4 x 0.2 cm. Left axilla: 2 abnormal LN.    Right breast: Intermediate 5 mm mass at 1:00, 2 CFN.      04/05/2020 Biopsy    1. Left breast USG core biopsy at 10:00: IDC, g3, ER weakly+ (5%), PR-, HER2 negative (1+)  2. Left axillary USG core biopsy: positive for carcinoma  3. Right breast USG core biopsy in the UIQ: intraductal papilloma      04/05/2020 Initial Diagnosis    Malignant neoplasm of upper-inner quadrant of left female breast (CMS-HCC)     04/19/2020 -  Cancer Staged    Staging form: Breast, AJCC 8th Edition  - Clinical stage from 04/19/2020: Stage IIB (cT1c, cN1(f), cM0, G3, ER+, PR-, HER2-) - Signed by Talbert Cage, DO on 04/19/2020       04/20/2020 Tumor Board    MDC recs: 54 yo female with left breast cT1c N1 IDC, G3, weakly +(5%), PR negative, HER2 negative.  1. NACT, ddAC-T  2. Genetic testing recommending as patient has additional FH. Will order stat panel today.  3. Savi placement in axillary LN x2 for TAD outback.   4. Path to request block to repeat receptors and confirm ER status  5. Staging studies     05/26/2020 -  Chemotherapy    OP BREAST AC (Dose Dense) Q2W x 4 CYCLES, THEN PACLITAXEL WEEKLY FOR 12 WEEKS  DOXOrubicin 60 mg/m2 IV on day 1, cyclophosphamide 600 mg/m2 IV on day 1, every 2 weeks for 4 cycles; PACLitaxel 80 mg/m2 weekly for 12 weeks       [name] presents today for initiation of chemotherapy after recent discharge from the hospital whre she underwent mediastinal LN biopsies. She is c/o abdominal distention, epigastric pain and  constipation. She was prescribed oral narcotics in the hospital for pain control, constipated for 1 week despite stool softeners, miralax, suppositories and enemas. She is really uncomfortable due to abdominal distention, associated nausea but no vomiting.  She denies any SOB but she is using supplemental oxygen as needed.    Past Medical History:   Diagnosis Date   ??? Breast cancer metastasized to axillary lymph node, left (CMS-HCC) 2019   ??? Pre-diabetes    ??? Sleep apnea        Gyn History: Status post hysterectomy, last menstrual period in 2016 prior to surgery, unsure if she has gone through menopause, no hot flashes or vaginal dryness    Past Surgical History:   Procedure Laterality Date   ??? HYSTERECTOMY  2016   ??? IR INSERT PORT AGE GREATER THAN 5 YRS  05/02/2020    IR INSERT PORT AGE GREATER THAN 5 YRS 05/02/2020 Braulio Conte, MD IMG VIR [facility]   ??? KNEE ARTHROSCOPY     ??? mastopexy Bilateral 2004   ??? PR THORCOSCPY W/MEDIASTINL &REGIONL LYMPHADENECTOMY Left 05/20/2020    Procedure: THORACOSCOPY, SURGICAL; WITH MEDIASTINAL & REGIONAL LYMPHADENECTOMY;  Surgeon: Monico Hoar, MD;  Location: MAIN [facility];  Service: Thoracic       Current Outpatient Medications   Medication Sig Dispense Refill   ??? acetaminophen (TYLENOL) 500 MG tablet Take 2 tablets (1,000 mg total) by mouth Every six (6) hours. 30 tablet 0   ??? docusate sodium (COLACE) 100 MG capsule Take 1 capsule (100 mg total) by mouth Two (2) times a day. 60 capsule 0   ??? gabapentin (NEURONTIN) 300 MG capsule Take 1 capsule (300 mg total) by mouth Three (3) times a day. 90 capsule 0   ??? ibuprofen (MOTRIN) 600 MG tablet Take 1 tablet (600 mg total) by mouth Every six (6) hours for 14 days. 56 tablet 0   ??? metFORMIN (GLUCOPHAGE) 500 MG tablet Take 500 mg by mouth.      ??? oxyCODONE (ROXICODONE) 5 MG immediate release tablet Take 1 tablet (5 mg total) by mouth every four (4) hours as needed for up to 5 days. 20 tablet 0   ??? polyethylene glycol (MIRALAX) 17 gram packet Dissolve 1 capful (17 g) in 4 to 8 oz of liquid and take by mouth Two (2) times a day for 7 days. 14 packet 0     No current facility-administered medications for this visit.       No Known Allergies    Social History     Tobacco Use   ??? Smoking status: Never Smoker   ??? Smokeless tobacco: Never Used   Vaping Use   ??? Vaping Use: Never used   Substance Use Topics   ??? Alcohol use: Yes     Comment: Very rarely   ??? 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      Husband had a stroke last year which affected his L side, he is R side dominant and relatively well recovered but not working.  He enjoys bowling in tournaments and pt will attend with him at times.      She also enjoys shopping (online and in person).    She has 6 children (3 sons, 3 daughters).  Family History   Problem Relation Age of Onset   ??? Stroke Mother    ??? Stroke Father    ??? No Known Problems Brother    ??? Breast cancer Paternal Grandmother 53   ??? No Known Problems Brother    ??? No Known Problems Brother    ??? Breast cancer Paternal Aunt 50   ??? No Known Problems Son    ??? No Known Problems Son    ??? No Known Problems Son    ??? No Known Problems Daughter    ??? No Known Problems Daughter    ??? No Known Problems Daughter    ??? Melanoma Neg Hx    ??? Basal cell carcinoma Neg Hx    ??? Squamous cell carcinoma Neg Hx    ??? Pancreatic cancer Neg Hx    ??? Prostate cancer Neg Hx    ??? Uterine cancer Neg Hx    ??? Ovarian cancer Neg Hx        Review of Systems: A 12-system review of systems was obtained including: Constitutional, Eyes, ENT, Cardiovascular, Respiratory, GI, GU, Musculoskeletal, Skin, Neurological, Psychiatric, Endocrine, Heme/Lymphatic, and Allergic/Immunologic systems. It is negative or non-contributory to the patient???s management except for the following: Abdominal pain and distention.    ECOG PS: 0    Physical Examination:  Vital Signs: BP 181/83  - Pulse 98  - Temp 36.3 ??C (97.3 ??F) (Temporal)  - Resp 18  - Ht 162.6 cm (5' 4)  - Wt (!) 105 kg (231 lb 6.4 oz)  - SpO2 95%  - BMI 39.72 kg/m??   CONSTITUTIONAL: Patient is in distress due to abdominal distention  HEENT: Pupils equally round, sclerae anicteric, conjunctiva clear, moist mucous membranes, no oral lesions or exudates, status post multiple dental extractions but no concern for active dental disease  NECK: Supple, no JVD  HEME/LYMPH: No palpable cervical, axillary, or supraclavicular lymphadenopathy  CV: Normal rate, regular rhythm, normal S1 and S2, no murmurs, rubs, or gallops; extremities warm, well-perfused, no lower extremity edema  RESP: Lungs clear to auscultation bilaterally, no wheezes, ronchi or rales, normal work of breathing  GI: Soft, distended, tender to palpation epigastrim, normoactive bowel sounds, no masses or organomegaly  MSK: No bony pain or tenderness  EXT: Digits and nails examined, no joint hypertrophy or nail changes  SKIN AND SUBCUTANEOUS TISSUES: No rashes or ecchymoses  GU/BREAST:   Performed by attending: Bilateral breasts were examined in the upright and supine positions. No visible dysymmetry appreciated. Bilateral breasts were palpated in all quadrants. Left breast with mass at 12 cm from the nipple, 11 o'clock position, measures 2.5 x 3.5 cm in upright position, 3 x 4 cm while supine; Right breast dense but without palpable mass or visible skin changes, status post bilateral breast reduction with well-healed periareolar incisions  NEURO: Alert and oriented, speech intact, following commands, moving all extremities well, no focal deficits appreciated  PSYCH: Normal mood and appropriate affect        I have personally reviewed the breast imaging, laboratory values, existing medical records, and pathology. I have summarized these findings and my interpretation in the oncology history and assessment above.

## 2020-05-26 NOTE — Unmapped (Signed)
I saw and evaluated the patient, participating in the key portions of the service.?? I reviewed the resident???s note.?? I agree with the resident???s findings and plan. [name] is a 54 y.o. woman with triple negative left breast cancer who presents today for consideration of cycle 1 of AC chemotherapy.  She recently underwent mediastinal lymph node evaluation with VATS which was complicated by hypoxemic respiratory failure requiring 2 L supplemental oxygen, possibly due to phrenic nerve injury and atelectasis.  She has self weaned off her supplemental oxygen as she is trying not to depend on it.  I advised her today to resume use until she is reevaluated by Dr. Juliene Pina and her team and can undergo a 6-minute walk test.  Additionally, [name] has not had a bowel movement in at least a week.  She developed constipation secondary to pain medicines while in the hospital.  She has discontinued use of opiates but still has not had a bowel movement.  Her abdomen is painful and distended.  She has been taking MiraLAX twice daily and Colace twice daily without response.  She has not tried anything else.  She is not walking regularly.  Her exam is notable for left breast tumor at 11 o'clock position, 12 cm from the nipple, 3 x 4 cm when supine, and distended abdomen with hypoactive bowel sounds, no rebound, positive guarding, intact surgical incisions without drainage, decreased breath sounds in left lower lung field, normal work of breathing.  Today, I recommend deferring initiation of chemotherapy until her bowels and respiratory status can be optimized.  Advised initiation of magnesium citrate for constipation while continuing MiraLAX and Colace.  I have asked her to contact me if she has not had a bowel movement by tomorrow.  She will follow-up with cardiothoracic surgery in 1 week for a 6-minute walk test. Given the pathology findings of positive IM node but negative mediastinal node, I would favor treating with curative intent. However, there are periaortic nodes that are radiographically concerning and appear to be part of the same chain as the IM node, and if involved would represent M1 disease. Will present at tumor board next week. I will see her back in 2 weeks for reconsideration of cycle 1 of AC if this aligns with the consensus of the tumor board.  Gwinda Passe, MD

## 2020-05-26 NOTE — Unmapped (Signed)
Port accessed.  Labs collected & sent for analysis.  To next appt.

## 2020-05-31 NOTE — Unmapped (Signed)
Call made to patient to follow up on constipation that was assessed last week in clinic.    Patient states that she is much better than I was. Having daily BM; last BM was last night, which was large in quantity, since she had been passing small amounts of stool prior to that time. She is taking Miralax and Colace and has increased amount of water intake and daily activity /exercise throughout the day. She has not taken any of the magnesium citrate, as she was unable to keep it down, but she has two bottles in her fridge just in case she needs to try this again.    Endorses feeling overall stronger than she did in clinic last week.     Discussed continuing with current bowel regimen to keep bowels moving at normal intervals. Informed patient that she should stop Miralax and/or Colace if she develops diarrhea. She denied any other questions/concerns/symptoms and thanked for the call.

## 2020-06-01 DIAGNOSIS — Z170 Estrogen receptor positive status [ER+]: Principal | ICD-10-CM

## 2020-06-01 DIAGNOSIS — C50212 Malignant neoplasm of upper-inner quadrant of left female breast: Principal | ICD-10-CM

## 2020-06-02 ENCOUNTER — Ambulatory Visit: Admit: 2020-06-02 | Discharge: 2020-06-03 | Payer: PRIVATE HEALTH INSURANCE

## 2020-06-02 ENCOUNTER — Ambulatory Visit
Admit: 2020-06-02 | Discharge: 2020-06-03 | Payer: PRIVATE HEALTH INSURANCE | Attending: Nurse Practitioner | Primary: Nurse Practitioner

## 2020-06-02 DIAGNOSIS — R591 Generalized enlarged lymph nodes: Principal | ICD-10-CM

## 2020-06-02 NOTE — Unmapped (Signed)
6 minute walk performed. Pt started walk and was 98% on room air. After 3 minutes O2 went down to 91%. Pt was able to walk for 2 more minutes, O2 remained at 91-92 heart rate was 112 and pt states she can not walk any longer.

## 2020-06-02 NOTE — Unmapped (Signed)
Thoracic Surgery Clinic Follow-up Note      Assessment/Plan:     Frances Kennedy is a 54 y.o. female never-smoker with a history of diabetes and newly diagnosed invasive ductal carcinoma found to have enlarged anterior mediastinal (prevascular and left internal mammary) lymph nodes which were avid on PET scan and concerning for metastatic disease, now s/p left thoracoscopic lymph node biopsies performed on May 20, 2020 who returns today for postoperative follow-up.   Frances Kennedy continues to recover from her recent procedure with healing incisions and controlled pain, and I removed her chest tube site sutures without difficulty. We discussed resuming gabapentin for her neuropathic pain as she has not been taking it. If this proves ineffective we may dose titrate vs. trial alternative agents. She endorses limited activity secondary to fatigue and some shortness of breath, though this is improving. During a repeat walk test today in clinic her oxygen saturations remained >91% on room air with ambulation, however she felt she needed to stop walking after approximately 5 minutes. Given this, she may remove her oxygen at rest and while ambulating short distances in her home. I encouraged her to increase her activity, including walking outside during cooler periods of the day, to improve her overall stamina. For these activities she should continue to use oxygen as needed, and we will reassess her progress and performance next visit. I also provided her with a new incentive spirometer and encouraged continued use. Today's chest x-ray shows elevation of the left hemidiaphragm with some adjacent atelectasis, though the elevation may be slightly improved from the prior study. We will continue to follow this with imaging.       Plan:  ?? May remove oxygen at rest and while ambulating short distances.   ?? New incentive spirometer given today in clinic  ?? Patient will restart gabapentin for neuropathic pain  ?? Return to clinic in 1 week with a CXR for reassessment.         Subjective     Frances Kennedy is a 54 y.o. female never-smoker with a history of diabetes and newly diagnosed invasive ductal carcinoma found to have enlarged anterior mediastinal (prevascular and left internal mammary) lymph nodes which were avid on PET scan and concerning for metastatic disease. On May 20, 2020 she underwent left video-assisted thoracoscopic surgery with biopsies of the lymph nodes. Her hospital course was complicated by persistent need for oxygen, and she was discharged on May 24, 2020 with home oxygen. Today she returns for postoperative follow-up.   While at home Frances Kennedy experienced significant opioid-induced constipation which has resolved with miralax and colace. She is now having regular bowel movements and her abdominal discomfort is much improved. She endorses a crampy pain along her left chest and discomfort under her left breast which encircles her chest. The pain is intermittent and annoying. She is able to sleep on her left side and feels her breathing improves. Her activity has been limited to walking in her home as she feels she becomes tired and short of breath. Her appetite has been poor and she reports early satiety.       Social History     Tobacco Use   Smoking Status Never Smoker   Smokeless Tobacco Never Used       Objective     Physical Exam:  Vitals:    06/02/20 1110   BP: 124/81   Pulse: 100   Resp: 18   Temp: 36.1 ??C (97 ??F)   SpO2: 99%  General: Well-developed, well-nourished female sitting in exam room in no acute distress.  HEENT: Normocephalic, sclera anicteric, EOMI, mucous membranes moist.  Neck: supple, trachea midline  Chest: Normal work of breathing on room air. No audible wheezes. Left VATS incisions clean, dry, and intact with approximated edges. No surrounding erythema or drainage.   CVS: Normal rate, regular rhythm. No edema.  GI: Abdomen soft, non-tender, non-distended  Ext: Warm and well-perfused   Neuro: Awake, alert and oriented x3, no focal deficits  Psych: Speech clear and coherent. Mood and affect appropriate    Data Review:  Imaging:   XR chest 06/02/2020: Images personally reviewed.   Elevation of left hemidiaphragm. Questionably slightly improved from prior with adjacent atelectasis.

## 2020-06-03 ENCOUNTER — Telehealth: Payer: Self-pay

## 2020-06-03 MED ORDER — FLUCONAZOLE 150 MG TABLET
0 days
Start: 2020-06-03 — End: ?

## 2020-06-03 MED ORDER — FLUCONAZOLE 150 MG PO TABS: 150.0000 mg | ORAL_TABLET | Freq: Once | ORAL | 1 refills | Status: AC

## 2020-06-03 NOTE — Telephone Encounter (Signed)
Patient called stating that she is having vaginal itching with a white discharge for the last two days.Patient states she has had yeast infection in the past and this is very similar presentation.   Patient made aware we could call diflucan one dose and if she is not having any relief she needs to come in for a self swab. Patient states understanding. Patient is due for annual exam so schedule her for annual exam. Kathrene Alu RN

## 2020-06-06 NOTE — Unmapped (Unsigned)
Breast Oncology Clinic    PCP: Pearline Cables, MD    Multidisciplinary team:  Surgical Oncology: Sherilyn Cooter, MD  Radiation Oncology: Thom Chimes, MD / Konrad Penta, NP  Plastic Surgery: None at this time  Genetics: None at this time    Reason for Visit: management of breast cancer    Assessment/Plan:    Frances Kennedy is a 54 y.o. woman with prediabetes and left breast cancer.    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  We discussed that her breast cancer is ER positive, and the lymph node biopsy was more positive than the initial breast biopsy. As such, I would not add immunotherapy in this patient given its lack of proven benefit in hormone receptor positive disease. I will use carboplatin given her locally advanced disease and ER only 5% in some specimens.     Port has been placed and baseline echocardiogram shows ejection fraction 60 to 65% and no significant valvular abnormalities.    We will proceed with dose-dense AC followed by weekly paclitaxel with carboplatin every 3 weeks, based on the findings of CALGB 16[address] [facility] et al. Journal of Clinical Oncology, January 2015.) in which carboplatin or bevacizumab was added to paclitaxel, significantly increasing rates of pCR (60% vs 44% for carboplatin).     - doxorubicin 60 mg/m2 IV on day 1   - cyclophosphamide 600 mg/m2 IV on day 1  * cycled every 14 days for 4 cycles  * growth factor support with each cycle, per NCCN guidelines    Followed by   - paclitaxel 80 mg/m2 IV weekly for 12 weeks   - carboplatin AUC 5 every 3 weeks for 4 doses (starting AUC 5)  * Added ondansetron 24 mg po once and aprepitant (Cinvanti) 130 mg IV once to her pre-medications.   * Added creatinine to lab orders    We have discussed the results of the diagnostic tests which confirm her to have ER+ breast cancer. We went over the systemic treatment regimen. Potential benefits of the proposed treatment are prolongation of survival, improvement of symptoms and decreasing the size of the tumor. The intent of the treatment is curative. I explained in detail the short-term and long-term risks/side effects that are commonly experienced by patients receiving the proposed treatment including but not limited to cytopenias, alopecia, nausea, vomiting, fatigue, infections, allergic reactions, kidney damage, liver damage, and peripheral neuropathy. I also explained less common but very severe side effects including a rare risk of heart damage, leukemia, and even death from treatment. We discussed the reasonable alternatives to the proposed treatment, including other chemotherapy regimens or no chemotherapy at all, and risks associated with the alternatives. I have let the patient know that they can decide to stop treatment at any time. All of the patient's and family's questions were answered to their satisfaction. Frances Kennedy expressed understanding and consents to proceed with the proposed treatment.    I have sent her supportive medications to the pharmacy, and she has received chemotherapy education from our clinical pharmacist.    Adjuvant therapy: We discussed that we would recommend adjuvant endocrine therapy following completion of adjuvant radiation. In this patient, I will plan to treat with adjuvant AI (will confirm postmenopausal  range FSH given s/p hysterectomy). I would consider the addition of abemaciclib per monarchE data given her proven nodal involvement and concerns for mediastinal involvement based on PET Ct.    # Genetics  Patient had genetic testing with breast cancer stat panel with add-on ATM and CHEK2 gene sent to [facility].  She had a negative result.    # Bone health  Will need DEXA prior to starting AI.    # Fertility  Patient is status post hysterectomy.    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 Recommend ambulation.  - Psychosocial: Patient feels like she is coping generally well and denies any need for CCSP at this time.     # Goals of Care:  - Discussion of an advanced directive: Not addressed  - Patient preferences: To start treatment promptly.  - Support systems: Husband  - Palliative care referral: No immediate indication    # Caregiver support:  The patient's husband is her primary caregiver / support person. At this time, she remains fully independent in ADLs.     # COVID-19 precautions  Frances Kennedy has received the COVID-19 vaccine, 2nd dose 02/19/20.     Follow up:   2 weeks for labs, clinic visit, consideration of C2 of [facility]    Future Appointments   Date Time Provider Department Center   06/23/2020  8:30 AM ADULT ONC LAB [facility]   06/23/2020  9:30 AM Valentina Lucks, NP [id number]   07/07/2020  8:30 AM ADULT ONC LAB [facility]   07/07/2020  9:30 AM Gwinda Passe, MD [id number]   07/21/2020 11:30 AM ADULT ONC LAB [facility]   07/21/2020 12:30 PM Gretchen [place], NP [id number]        I personally spent 40 minutes face-to-face and non-face-to-face in the care of this patient, which includes all pre, intra, and post visit time on the date of service.     --------------------------    History:  Frances Kennedy is a 54 y.o. woman with prediabetes who presents for evaluation of breast cancer.    Since our last visit, Frances Kennedy is feeling much better. She is having regular bowel movements. Her breathing has improved. She requires supplemental O2 only with exertion. She is using the incentive spirometer. She denies recent fevers or chills. Her surgical wounds are healing well, by her report.     I have reviewed her records including history, imaging, pathology reports, and, when applicable, operative notes and summarized her oncologic history below:    Oncology History Overview Note   2021: Left breast cT1c cN1, IDC, G3, weakly ER+ (5%), PR-, HER2-     Malignant neoplasm of upper-inner quadrant of left female breast (CMS-HCC)    - 03/29/2020 Presenting Symptoms    Self-palpated left breast mass      03/29/2020 Interval Scan(s)    MMG with tomo: asymmetric density UIQ of left breast. Additional oval, circumscribed mass in the lateral left breast, middle depth. Right breast, multiple round circumscribed masses scattered centrally.     [place]: Left breast: irregular, hypoechoic mass, 10:00, 1.7 x 1.7 x 1.5 cm. Additional oval, circumscribed anechoic mass, 2:00 5 CFN measuring 0.4 x 0.4 x 0.2 cm. Left axilla: 2 abnormal LN.    Right breast: Intermediate 5 mm mass at 1:00, 2 CFN.  04/05/2020 Biopsy    1. Left breast USG core biopsy at 10:00: IDC, g3, ER weakly+ (5%), PR-, HER2 negative (1+)  2. Left axillary USG core biopsy: positive for carcinoma  3. Right breast USG core biopsy in the UIQ: intraductal papilloma      04/05/2020 Initial Diagnosis    Malignant neoplasm of upper-inner quadrant of left female breast (CMS-HCC)     04/19/2020 -  Cancer Staged    Staging form: Breast, AJCC 8th Edition  - Clinical stage from 04/19/2020: Stage IIB (cT1c, cN1(f), cM0, G3, ER+, PR-, HER2-) - Signed by Talbert Cage, DO on 04/19/2020       04/20/2020 Tumor Board    MDC recs: 54 yo female with left breast cT1c N1 IDC, G3, weakly +(5%), PR negative, HER2 negative.  1. NACT, ddAC-T  2. Genetic testing recommending as patient has additional FH. Will order stat panel today.  3. Savi placement in axillary LN x2 for TAD outback.   4. Path to request block to repeat receptors and confirm ER status  5. Staging studies     05/26/2020 Genetics    Patient had genetic testing with breast cancer stat panel with add-on ATM and CHEK2 gene sent to [facility].  She had a negative result.     06/01/2020 Tumor Board    CT1cN3, biopsy confirms involvement of internal mammary node, but mediastinal node negative. Treat with curative intent therapy. Repeat receptors on node to determine if ER negative. If so, would add immunotherapy.    Internal mammary node: ER 40%, PR negative, HER2 negative  Proceed with NA ddAC-T plus carboplatin     06/09/2020 -  Chemotherapy    OP BREAST AC (DOSE DENSE) Q2W X 4 CYCLES, THEN PACLITAXEL WEEKLY / CARBOPLATIN EVERY 3 WEEKS FOR 12 WEEKS  DOXOrubicin 60 mg/m2 IV on day 1, cyclophosphamide 600 mg/m2 IV every 2 weeks for 4 cycles, THEN PACLitaxel 80 mg/m2 IV weekly, CARBOplatin AUC 6 IV every 3 weeks for 12 weeks       Gyn History: Status post hysterectomy, last menstrual period in 2016 prior to surgery, unsure if she has gone through menopause, no hot flashes or vaginal dryness      Social History     Social History Narrative    Married, 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      Husband had a stroke last year which affected his L side, he is R side dominant and relatively well recovered but not working.  He enjoys bowling in tournaments and pt will attend with him at times.      She also enjoys shopping (online and in person).    She has 6 children (3 sons, 3 daughters).     ECOG PS: 0    Physical Examination:  Vital Signs: BP 149/91  - Pulse 104  - Temp 37 ??C (98.6 ??F) (Oral)  - Resp 18  - Ht 162.6 cm (5' 4.02)  - Wt (!) 103.7 kg (228 lb 9.6 oz)  - SpO2 100%  - BMI 39.22 kg/m??   CONSTITUTIONAL: Pleasant, well-appearing woman in NAD  HEENT: Pupils equally round, sclerae anicteric, conjunctiva clear, moist mucous membranes, no oral lesions or exudates, status post multiple dental extractions but no concern for active dental disease  NECK: Supple  HEME/LYMPH: No palpable cervical, axillary, or supraclavicular lymphadenopathy  CV: Tachycardic, regular rhythm, normal S1 and S2, no murmurs, rubs,  or gallops; extremities warm, well-perfused, no lower extremity edema  RESP: Lungs clear to auscultation bilaterally, no wheezes, ronchi or rales, normal work of breathing  GI: Soft, distended, tender to palpation epigastrim, normoactive bowel sounds, no masses or organomegaly  EXT: Digits and nails examined, no joint hypertrophy or nail changes  SKIN AND SUBCUTANEOUS TISSUES: No rashes or ecchymoses; surgical incisions intact with good healing; left axillary line not fully healed but no purulence or drainage  GU/BREAST: Bilateral breasts were examined in the upright and supine positions. No visible dysymmetry appreciated. Bilateral breasts were palpated in all quadrants. Left breast with mass at 12 cm from the nipple, 11 o'clock position, measures 2.5 x 3.5 cm in upright position, 3 x 4 cm while supine; Right breast dense but without palpable mass or visible skin changes, status post bilateral breast reduction with well-healed periareolar incisions  NEURO: Alert and oriented, speech intact, following commands, moving all extremities well, no focal deficits appreciated  PSYCH: Normal mood and appropriate affect        I have personally reviewed the breast imaging, laboratory values, existing medical records, and pathology. I have summarized these findings and my interpretation in the oncology history and assessment above. Problems Brother    ??? Breast cancer Paternal Aunt 59   ??? No Known Problems Son    ??? No Known Problems Son    ??? No Known Problems Son    ??? No Known Problems Daughter    ??? No Known Problems Daughter    ??? No Known Problems Daughter    ??? Melanoma Neg Hx    ??? Basal cell carcinoma Neg Hx    ??? Squamous cell carcinoma Neg Hx    ??? Pancreatic cancer Neg Hx    ??? Prostate cancer Neg Hx    ??? Uterine cancer Neg Hx    ??? Ovarian cancer Neg Hx        Review of Systems: A 12-system review of systems was obtained including: Constitutional, Eyes, ENT, Cardiovascular, Respiratory, GI, GU, Musculoskeletal, Skin, Neurological, Psychiatric, Endocrine, Heme/Lymphatic, and Allergic/Immunologic systems. It is negative or non-contributory to the patient???s management except for the following: Abdominal pain and distention.    ECOG PS: 0    Physical Examination:  Vital Signs: There were no vitals taken for this visit.  CONSTITUTIONAL: Patient is in distress due to abdominal distention  HEENT: Pupils equally round, sclerae anicteric, conjunctiva clear, moist mucous membranes, no oral lesions or exudates, status post multiple dental extractions but no concern for active dental disease  NECK: Supple, no JVD  HEME/LYMPH: No palpable cervical, axillary, or supraclavicular lymphadenopathy  CV: Normal rate, regular rhythm, normal S1 and S2, no murmurs, rubs, or gallops; extremities warm, well-perfused, no lower extremity edema  RESP: Lungs clear to auscultation bilaterally, no wheezes, ronchi or rales, normal work of breathing  GI: Soft, distended, tender to palpation epigastrim, normoactive bowel sounds, no masses or organomegaly  MSK: No bony pain or tenderness  EXT: Digits and nails examined, no joint hypertrophy or nail changes  SKIN AND SUBCUTANEOUS TISSUES: No rashes or ecchymoses  GU/BREAST:   Performed by attending: Bilateral breasts were examined in the upright and supine positions. No visible dysymmetry appreciated. Bilateral breasts were palpated in all quadrants. Left breast with mass at 12 cm from the nipple, 11 o'clock position, measures 2.5 x 3.5 cm in upright position, 3 x 4 cm while supine; Right breast dense but without palpable mass or visible skin changes, status post bilateral breast  reduction with well-healed periareolar incisions  NEURO: Alert and oriented, speech intact, following commands, moving all extremities well, no focal deficits appreciated  PSYCH: Normal mood and appropriate affect        I have personally reviewed the breast imaging, laboratory values, existing medical records, and pathology. I have summarized these findings and my interpretation in the oncology history and assessment above.

## 2020-06-08 MED ORDER — LORAZEPAM 0.5 MG TABLET
ORAL_TABLET | Freq: Every evening | ORAL | 0 refills | 12.00000 days | Status: CP | PRN
Start: 2020-06-08 — End: ?
  Filled 2020-06-09: qty 12, 56d supply, fill #0

## 2020-06-08 MED ORDER — ONDANSETRON HCL 4 MG TABLET
ORAL_TABLET | Freq: Three times a day (TID) | ORAL | 2 refills | 5.00000 days | Status: CP | PRN
Start: 2020-06-08 — End: ?
  Filled 2020-06-09: qty 24, 4d supply, fill #0

## 2020-06-08 MED ORDER — PROCHLORPERAZINE MALEATE 10 MG TABLET
ORAL_TABLET | Freq: Four times a day (QID) | ORAL | 2 refills | 8 days | Status: CP | PRN
Start: 2020-06-08 — End: ?
  Filled 2020-06-09: qty 30, 8d supply, fill #0

## 2020-06-08 MED ORDER — DEXAMETHASONE 4 MG TABLET
ORAL_TABLET | Freq: Every day | ORAL | 0 refills | 12.00000 days | Status: CP
Start: 2020-06-08 — End: ?
  Filled 2020-06-09: qty 24, 56d supply, fill #0

## 2020-06-09 ENCOUNTER — Other Ambulatory Visit: Admit: 2020-06-09 | Discharge: 2020-06-10 | Payer: PRIVATE HEALTH INSURANCE

## 2020-06-09 ENCOUNTER — Ambulatory Visit: Admit: 2020-06-09 | Discharge: 2020-06-10 | Payer: PRIVATE HEALTH INSURANCE

## 2020-06-09 ENCOUNTER — Ambulatory Visit
Admit: 2020-06-09 | Discharge: 2020-06-10 | Payer: PRIVATE HEALTH INSURANCE | Attending: Nurse Practitioner | Primary: Nurse Practitioner

## 2020-06-09 ENCOUNTER — Ambulatory Visit
Admit: 2020-06-09 | Discharge: 2020-06-10 | Payer: PRIVATE HEALTH INSURANCE | Attending: Hematology & Oncology | Primary: Hematology & Oncology

## 2020-06-09 DIAGNOSIS — C50212 Malignant neoplasm of upper-inner quadrant of left female breast: Principal | ICD-10-CM

## 2020-06-09 DIAGNOSIS — Z170 Estrogen receptor positive status [ER+]: Principal | ICD-10-CM

## 2020-06-09 LAB — FOLLICLE STIMULATING HORMONE: Follitropin:ACnc:Pt:Ser/Plas:Qn:: 16.3

## 2020-06-09 LAB — COMPREHENSIVE METABOLIC PANEL
ALBUMIN: 3.3 g/dL — ABNORMAL LOW (ref 3.4–5.0)
ALKALINE PHOSPHATASE: 120 U/L — ABNORMAL HIGH (ref 46–116)
ALT (SGPT): 16 U/L (ref 10–49)
ANION GAP: 7 mmol/L (ref 5–14)
AST (SGOT): 16 U/L (ref <=34)
BILIRUBIN TOTAL: 0.3 mg/dL (ref 0.3–1.2)
BUN / CREAT RATIO: 10
CALCIUM: 9.5 mg/dL (ref 8.7–10.4)
CHLORIDE: 100 mmol/L (ref 98–107)
CO2: 28 mmol/L (ref 20.0–31.0)
CREATININE: 0.67 mg/dL
EGFR CKD-EPI NON-AA FEMALE: >90 mL/min/{1.73_m2} (ref >=60)
GLUCOSE RANDOM: 177 mg/dL (ref 70–179)
POTASSIUM: 3.8 mmol/L (ref 3.4–4.5)
PROTEIN TOTAL: 7.7 g/dL (ref 5.7–8.2)
SODIUM: 135 mmol/L (ref 135–145)

## 2020-06-09 LAB — CBC W/ AUTO DIFF
BASOPHILS ABSOLUTE COUNT: 0 10*9/L (ref 0.0–0.1)
BASOPHILS RELATIVE PERCENT: 0.2 %
EOSINOPHILS RELATIVE PERCENT: 0.3 %
HEMATOCRIT: 37.1 % (ref 36.0–46.0)
HEMOGLOBIN: 11.9 g/dL — ABNORMAL LOW (ref 12.0–16.0)
LARGE UNSTAINED CELLS: 2 % (ref 0–4)
LYMPHOCYTES RELATIVE PERCENT: 30.1 %
MEAN CORPUSCULAR HEMOGLOBIN CONC: 32 g/dL (ref 31.0–37.0)
MEAN CORPUSCULAR HEMOGLOBIN: 28.6 pg (ref 26.0–34.0)
MEAN CORPUSCULAR VOLUME: 89.4 fL (ref 80.0–100.0)
MEAN PLATELET VOLUME: 8.8 fL (ref 7.0–10.0)
MONOCYTES ABSOLUTE COUNT: 0.5 10*9/L (ref 0.2–0.8)
MONOCYTES RELATIVE PERCENT: 7.7 %
NEUTROPHILS RELATIVE PERCENT: 59.8 %
PLATELET COUNT: 434 10*9/L (ref 150–440)
RED BLOOD CELL COUNT: 4.15 10*12/L (ref 4.00–5.20)
RED CELL DISTRIBUTION WIDTH: 14.2 % (ref 12.0–15.0)
WBC ADJUSTED: 6.6 10*9/L (ref 4.5–11.0)

## 2020-06-09 LAB — EOSINOPHILS RELATIVE PERCENT: Eosinophils/100 leukocytes:NFr:Pt:Bld:Qn:Automated count: 0.3

## 2020-06-09 LAB — ESTRADIOL LEVEL: Estradiol:MCnc:Pt:Ser/Plas:Qn:: 392.2

## 2020-06-09 LAB — SODIUM: Sodium:SCnc:Pt:Ser/Plas:Qn:: 135

## 2020-06-09 LAB — LUTEINIZING HORMONE: Lutropin:ACnc:Pt:Ser/Plas:Qn:: 18.9

## 2020-06-09 MED ORDER — LIDOCAINE-PRILOCAINE 2.5 %-2.5 % TOPICAL CREAM
Freq: Once | TOPICAL | 0 refills | 0 days | Status: CP | PRN
Start: 2020-06-09 — End: ?

## 2020-06-09 MED ADMIN — ondansetron (ZOFRAN) tablet 24 mg: 24 mg | ORAL | @ 17:00:00 | Stop: 2020-06-09

## 2020-06-09 MED ADMIN — fosaprepitant (EMEND) 150 mg in sodium chloride (NS) 0.9 % 100 mL IVPB: 150 mg | INTRAVENOUS | @ 17:00:00 | Stop: 2020-06-09

## 2020-06-09 MED ADMIN — dexAMETHasone (DECADRON) tablet 12 mg: 12 mg | ORAL | @ 17:00:00 | Stop: 2020-06-09

## 2020-06-09 MED ADMIN — pegfilgrastim (NEULASTA ONPRO) wearable injector 6 mg: 6 mg | SUBCUTANEOUS | @ 19:00:00 | Stop: 2020-06-09

## 2020-06-09 MED ADMIN — cyclophosphamide (CYTOXAN) 1,308 mg in sodium chloride (NS) 0.9 % 250 mL IVPB: 600 mg/m2 | INTRAVENOUS | @ 18:00:00 | Stop: 2020-06-09

## 2020-06-09 MED ADMIN — DOXOrubicin (ADRIAMYCIN) syringe: 60 mg/m2 | INTRAVENOUS | @ 18:00:00 | Stop: 2020-06-09

## 2020-06-09 MED ADMIN — heparin, porcine (PF) 100 unit/mL injection 500 Units: 500 [IU] | INTRAVENOUS | @ 19:00:00 | Stop: 2020-06-10

## 2020-06-09 MED ADMIN — sodium chloride 0.9% (NS) bolus 500 mL: 500 mL | INTRAVENOUS | @ 17:00:00 | Stop: 2020-06-09

## 2020-06-09 MED FILL — LORAZEPAM 0.5 MG TABLET: 56 days supply | Qty: 12 | Fill #0 | Status: AC

## 2020-06-09 MED FILL — DEXAMETHASONE 4 MG TABLET: 56 days supply | Qty: 24 | Fill #0 | Status: AC

## 2020-06-09 MED FILL — PROCHLORPERAZINE MALEATE 10 MG TABLET: 8 days supply | Qty: 30 | Fill #0 | Status: AC

## 2020-06-09 MED FILL — ONDANSETRON HCL 4 MG TABLET: 4 days supply | Qty: 24 | Fill #0 | Status: AC

## 2020-06-09 NOTE — Unmapped (Signed)
I recommend that you receive the COVID vaccine if you have not yet received it.    For more information regarding receiving the COVID-19 vaccine at [facility], go to:  [email or web address]  or call [phone] (M-F, 8 am-5pm)    For information regarding [place] priority groups, go to:  [email or web address]    ------------------------    For appointments & urgent questions Monday through Friday 8 AM???5 PM:  Please call [phone] or Toll free [phone]    Your medical oncologist is: Dr. Wendall Papa  Your medical oncology nurse practioner is: Lubertha Basque, NP  Your nurse navigator is: Dan Maker, RN  Your clinical pharmacist is: Dr. Raelene Bott    For urgent clinical needs on Nights, Weekends or Holidays:  Call [phone] and ask for the oncologist on call.   For appointment changes, please contact us during normal business hours, as we do not have staff to assist with scheduling after hours.     You can also message your team using [email or web address]. This is for non-urgent messages only. It may take 24-48 hours for your team to answer you. For non-urgent questions, please consider waiting until your next clinic visit to discuss with your medical team.    ---------------------  Please visit PrivacyFever.cz, a resource created just for family members and caregivers.  This website lists support services, how and where to ask for help. It has tools to assist you as you help [place] care for your loved one.      [facility]  [address]  [place], [place] [address]  [email or web address]

## 2020-06-09 NOTE — Unmapped (Signed)
Patient tx completed, IV/Port flushed and deaccessed. Patient educated on pro. Patient and family member verbalized understanding. Patient discharged alert, oriented, and in stable condition.

## 2020-06-09 NOTE — Unmapped (Signed)
Pharmacy: First Cycle Chemotherapy Patient Education    Chemotherapy regimen/agents: [facility]  Venous Access: port  Caregivers present for education: none    [name] is a 54 year old with breast cancer. Chemotherapy education was provided to the patient by an oncology pharmacist.      Side effects discussed included but were not limited to:   complications associated with myelosuppresion (such as infection/fever, fatigue, and bleeding), nausea/vomiting, constipation, mucositis, taste changes, hair loss, skin/nail changes, cardiac toxicity, changes in color of bodily fluids or fatigue.    The Neulasta Onpro device was discussed, including how to recognize signs of possible device malfunction. Patient aware of the need to contact oncology team in the event of device detachment or malfunction and understands they will need to return to [facility] for a pegfilgrastim injection. Reviewed prevention and treatment of bone pain.    The [facility] patient handout, antiemetic handout or the Hematology/Oncology Fellow on-call phone number for concerns after hours were given.The patient verbalized understanding of this information.  Medication reconciliation was completed and the medication list was updated in EPIC.      Approximate time spent with patient: 18  Minutes.    Kennon Holter, PharmD, CPP        I spent 18 minutes on the phone with the patient on the date of service. I spent an additional 15 minutes on pre- and post-visit activities on the date of service.     The patient was physically located in [place] or a state in which I am permitted to provide care. The patient and/or parent/guardian understood that s/he may incur co-pays and cost sharing, and agreed to the telemedicine visit. The visit was reasonable and appropriate under the circumstances given the patient's presentation at the time.    The patient and/or parent/guardian has been advised of the potential risks and limitations of this mode of treatment (including, but not limited to, the absence of in-person examination) and has agreed to be treated using telemedicine. The patient's/patient's family's questions regarding telemedicine have been answered.     If the visit was completed in an ambulatory setting, the patient and/or parent/guardian has also been advised to contact their provider???s office for worsening conditions, and seek emergency medical treatment and/or call 911 if the patient deems either necessary.

## 2020-06-09 NOTE — Unmapped (Signed)
Pharmacist Note    Contacted [facility] to deliver nausea meds to patient while in infusion chair.    -Kennon Holter, PharmD, CPP

## 2020-06-09 NOTE — Unmapped (Signed)
Labs found to be within parameters for treatment today. Request for drug sent to pharmacy.

## 2020-06-09 NOTE — Unmapped (Signed)
Port accessed and labs drawn with no complications by mary B.  Port flushed with saline.

## 2020-06-09 NOTE — Unmapped (Signed)
Encounter addended by: Anne Shutter, CPP on: 06/09/2020 3:59 PM   Actions taken: Clinical Note Signed

## 2020-06-09 NOTE — Unmapped (Signed)
Thoracic Surgery Clinic Follow-up Note      Assessment/Plan:     Frances Kennedy is a 54 y.o. African American female presents to clinic for follow up.  Her history includes diabetes and newly diagnosed invasive ductal carcinoma found to have enlarged anterior mediastinal (prevascular and left internal mammary) lymph nodes     Plan:  ?? May continue to use oxygen while ambulating    Subjective     Frances Kennedy is a 54 y.o. y/o female ***      Objective     Physical Exam:  Vitals:    06/09/20 0944   BP: (S) 174/104   Pulse: 99   Resp: 16   Temp: 36.2 ??C (97.2 ??F)   SpO2: 100%     General: Well-developed, well-nourished female sitting in exam room in no acute distress.  HEENT: Normocephalic, sclera anicteric, EOMI, mucous membranes moist.  Neck: supple, trachea midline  Respiratory: Normal work of breathing on room air. No audible wheezes  Skin:  incisions clean, dry, and intact with approximated edges. Surrounding skin without erythema, no drainage.   CVS: Regular rate and rhythm. No edema.  GI: Soft, non-tender, non-distended  Ext: Warm and well-perfused   Neuro: Awake, alert and oriented x3, no focal deficits  Psych: Speech clear and coherent. Mood and affect appropriate    Data Review:  Imaging: {GCS Imaging findings:[id number]} fevers, chills, congestion, shortness of breath, hemoptysis, nausea, and vomiting.  She needed the portable oxygen during her walk from garage to hospital today.  She agrees to continue to increase activity, increase ambulation and continue use of the [employer].       Objective     Physical Exam:  Vitals:    06/09/20 0944   BP: (S) 174/104   Pulse: 99   Resp: 16   Temp: 36.2 ??C (97.2 ??F)   SpO2: 100%     General: Well-developed, well-nourished female sitting in exam room in no acute distress.  HEENT: Normocephalic, sclera anicteric, EOMI, mucous membranes moist.  Neck: supple, trachea midline  Respiratory: Normal work of breathing on room air. No audible wheezes  Skin:  incisions clean, dry, and intact with approximated edges. Surrounding skin without erythema, no drainage.   CVS: Regular rate and rhythm. No edema.  GI: Soft, non-tender, non-distended  Ext: Warm and well-perfused   Neuro: Awake, alert and oriented x 3, no focal deficits  Psych: Speech clear and coherent. Mood and affect appropriate    Data Review:  Imaging: Chest X-ray 06/09/20 personally reviewed  - Persistent moderate left pleural effusion and associated atelectasis

## 2020-06-13 ENCOUNTER — Ambulatory Visit: Payer: Commercial Managed Care - PPO | Admitting: Obstetrics & Gynecology

## 2020-06-13 NOTE — Unmapped (Signed)
Hi,     [employer] contacted the Communication Center requesting to speak with the care team of Nolon Nations to discuss:    Need status update regarding patient's condition. Please fax most recent visit notes to [phone].    Please contact patient's case manager at [phone].    Thank you,   Kelli Hope  [facility] Communication Center   [phone]

## 2020-06-14 NOTE — Unmapped (Signed)
Call made to patient to follow up after C1 ddAC.    She endorses the area around her diaphragm swelling up again. She states that some days it's fine, but I could tell a difference on Saturday. Endorses a constant pain in the area - taking Gabapentin as prescribed and feels that this is helping mitigate the pain. When asked whether she feels this is worsening or if it's about the same as when she was seen in clinic last week, she states it feels about the same. Reiterated recommendations that Dr. Rosalia Hammers commented in recent prgress note from last clinic visit.    Denies any fevers/chills. Endorses having a working thermometer. Discussed seeking care at ER if develops a fever or 100.82F or greater. Stressed importance and seriousness of this and encouraged patient to notify our team by phone if this occurs.    Endorses mild nausea; taking zofran first thing in the AM (only one per day) and then taking compazine every 6 hours. She feels the nausea is subsiding as the days go on. Encouraged her to continue taking the anti-emetics as she is and discussed alternating between the two, if needed, for better control.     Discussed appetite - not good, it was okay, but since Saturday, it has been decreasing, I can't tolerate smells, I get nauseated. Endorses eating fruit; just picked up some premier protein shakes as well. Reviewed normal taste changes that can occur during chemotherapy. Reviewed eating foods at room temperature or foods that are cold to reduce any odors that cause nausea. Discussed eating 6 smaller meals throughout the day, versus 3 larger meals. Reviewed incorporating the premier protein beverages into their diet, if solid food becomes unappealing. Encouraged patient to eat foods that are sound appealing to them during this time, as we would like to maintain a stable weight.    Inquired about water intake- it's been okay, tolerating water okay, but has to be room temperature. She estimates she is drinking about 3 8oz glasses per day. Discussed recommended 6-8 8oz glasses of water per day, and increasing this amount of water intake if experiencing vomiting or diarrhea. Discussed adding fruit or a small amount of juice/gatorade to water, if the taste of water is or becomes bothersome.     Denies any mouth sores; hasn't done the warm saline rinses but is using biotene mouth wash and feels this is helping with any mouth discomfort. Reviewed doing warm saline rinses 4x/day if mouth sores develop. Encouraged patient to reach out to the team if sores develop or if warm saline rinses are insufficient at managing discomfort.    Last BM was two days ago, which she states is typical for her. Denies constipation/diarrhea. Reviewed importance of bowel hygiene and maintaining a normal BM schedule.     Endorses mild fatigue. Discussed ways at managing fatigue such as frequent ambulation.     Patient denied any other questions/symptoms/concerns. Encouraged patient to reach out if new symptoms/issues develop. She verbalized understanding and thanked for the call.

## 2020-06-22 DIAGNOSIS — R0602 Shortness of breath: Principal | ICD-10-CM

## 2020-06-22 NOTE — Unmapped (Signed)
Called pt and left message that we would be scheduling her on 07/12/20, and I left my #  [phone] for her to call me back if she has any issues.     06/22/20 @ 11:52, LK

## 2020-06-23 ENCOUNTER — Ambulatory Visit: Admit: 2020-06-23 | Discharge: 2020-06-24 | Payer: PRIVATE HEALTH INSURANCE

## 2020-06-23 ENCOUNTER — Other Ambulatory Visit: Admit: 2020-06-23 | Discharge: 2020-06-24 | Payer: PRIVATE HEALTH INSURANCE

## 2020-06-23 ENCOUNTER — Ambulatory Visit
Admit: 2020-06-23 | Discharge: 2020-06-24 | Payer: PRIVATE HEALTH INSURANCE | Attending: Adult Health | Primary: Adult Health

## 2020-06-23 DIAGNOSIS — Z170 Estrogen receptor positive status [ER+]: Secondary | ICD-10-CM

## 2020-06-23 DIAGNOSIS — R112 Nausea with vomiting, unspecified: Principal | ICD-10-CM

## 2020-06-23 DIAGNOSIS — C50212 Malignant neoplasm of upper-inner quadrant of left female breast: Principal | ICD-10-CM

## 2020-06-23 DIAGNOSIS — T451X5A Adverse effect of antineoplastic and immunosuppressive drugs, initial encounter: Secondary | ICD-10-CM

## 2020-06-23 LAB — COMPREHENSIVE METABOLIC PANEL
ALBUMIN: 3.5 g/dL (ref 3.4–5.0)
ALKALINE PHOSPHATASE: 136 U/L — ABNORMAL HIGH (ref 46–116)
ANION GAP: 8 mmol/L (ref 5–14)
AST (SGOT): 18 U/L (ref <=34)
BILIRUBIN TOTAL: 0.2 mg/dL — ABNORMAL LOW (ref 0.3–1.2)
BLOOD UREA NITROGEN: 8 mg/dL — ABNORMAL LOW (ref 9–23)
BUN / CREAT RATIO: 11
CALCIUM: 9.2 mg/dL (ref 8.7–10.4)
CHLORIDE: 101 mmol/L (ref 98–107)
CO2: 27 mmol/L (ref 20.0–31.0)
CREATININE: 0.76 mg/dL
EGFR CKD-EPI AA FEMALE: >90 mL/min/{1.73_m2} (ref >=60)
EGFR CKD-EPI NON-AA FEMALE: 88 mL/min/{1.73_m2} (ref >=60)
POTASSIUM: 3.5 mmol/L (ref 3.4–4.5)
PROTEIN TOTAL: 7.7 g/dL (ref 5.7–8.2)
SODIUM: 136 mmol/L (ref 135–145)

## 2020-06-23 LAB — CBC W/ AUTO DIFF
BASOPHILS ABSOLUTE COUNT: 0 10*9/L (ref 0.0–0.1)
BASOPHILS RELATIVE PERCENT: 0.4 %
EOSINOPHILS ABSOLUTE COUNT: 0 10*9/L (ref 0.0–0.4)
HEMATOCRIT: 37.3 % (ref 36.0–46.0)
HEMOGLOBIN: 11.9 g/dL — ABNORMAL LOW (ref 12.0–16.0)
LARGE UNSTAINED CELLS: 3 % (ref 0–4)
LYMPHOCYTES ABSOLUTE COUNT: 2 10*9/L (ref 1.5–5.0)
LYMPHOCYTES RELATIVE PERCENT: 28.9 %
MEAN CORPUSCULAR HEMOGLOBIN CONC: 32.1 g/dL (ref 31.0–37.0)
MEAN CORPUSCULAR HEMOGLOBIN: 28.7 pg (ref 26.0–34.0)
MEAN CORPUSCULAR VOLUME: 89.5 fL (ref 80.0–100.0)
MEAN PLATELET VOLUME: 9.4 fL (ref 7.0–10.0)
MONOCYTES RELATIVE PERCENT: 7.9 %
NEUTROPHILS ABSOLUTE COUNT: 4.2 10*9/L (ref 2.0–7.5)
NEUTROPHILS RELATIVE PERCENT: 59.6 %
PLATELET COUNT: 342 10*9/L (ref 150–440)
RED BLOOD CELL COUNT: 4.16 10*12/L (ref 4.00–5.20)
RED CELL DISTRIBUTION WIDTH: 14.6 % (ref 12.0–15.0)
WBC ADJUSTED: 7 10*9/L (ref 4.5–11.0)

## 2020-06-23 LAB — ALT (SGPT): Alanine aminotransferase:CCnc:Pt:Ser/Plas:Qn:: 19

## 2020-06-23 LAB — LARGE UNSTAINED CELLS: Lab: 3

## 2020-06-23 MED ORDER — OLANZAPINE 5 MG TABLET
ORAL_TABLET | 0 refills | 0 days | Status: CP
Start: 2020-06-23 — End: ?

## 2020-06-23 MED ADMIN — sodium chloride (NS) 0.9 % infusion: 100 mL/h | INTRAVENOUS | @ 17:00:00

## 2020-06-23 MED ADMIN — dexAMETHasone (DECADRON) tablet 12 mg: 12 mg | ORAL | @ 17:00:00 | Stop: 2020-06-23

## 2020-06-23 MED ADMIN — sodium chloride 0.9% (NS) bolus 500 mL: 500 mL | INTRAVENOUS | @ 17:00:00 | Stop: 2020-06-23

## 2020-06-23 MED ADMIN — DOXOrubicin (ADRIAMYCIN) syringe: 60 mg/m2 | INTRAVENOUS | @ 18:00:00 | Stop: 2020-06-23

## 2020-06-23 MED ADMIN — pegfilgrastim (NEULASTA ONPRO) wearable injector 6 mg: 6 mg | SUBCUTANEOUS | @ 19:00:00 | Stop: 2020-06-23

## 2020-06-23 MED ADMIN — heparin, porcine (PF) 100 unit/mL injection 500 Units: 500 [IU] | INTRAVENOUS | @ 19:00:00 | Stop: 2020-06-24

## 2020-06-23 MED ADMIN — ondansetron (ZOFRAN) tablet 24 mg: 24 mg | ORAL | @ 17:00:00 | Stop: 2020-06-23

## 2020-06-23 MED ADMIN — fosaprepitant (EMEND) 150 mg in sodium chloride (NS) 0.9 % 100 mL IVPB: 150 mg | INTRAVENOUS | @ 17:00:00 | Stop: 2020-06-23

## 2020-06-23 MED ADMIN — cyclophosphamide (CYTOXAN) 1,308 mg in sodium chloride (NS) 0.9 % 250 mL IVPB: 600 mg/m2 | INTRAVENOUS | @ 18:00:00 | Stop: 2020-06-23

## 2020-06-23 NOTE — Unmapped (Unsigned)
Port accessed and labs drawn with no complications by Colleen C P.Marland Kitchen  Port flushed with saline.

## 2020-06-23 NOTE — Unmapped (Signed)
Labs found to be within parameters for treatment today. Request for drug sent to pharmacy.

## 2020-06-23 NOTE — Unmapped (Signed)
Left chest wall

## 2020-06-23 NOTE — Unmapped (Signed)
Lab on 06/23/2020   Component Date Value Ref Range Status   ??? Sodium 06/23/2020 136  135 - 145 mmol/L Final   ??? Potassium 06/23/2020 3.5  3.4 - 4.5 mmol/L Final   ??? Chloride 06/23/2020 101  98 - 107 mmol/L Final   ??? Anion Gap 06/23/2020 8  5 - 14 mmol/L Final   ??? CO2 06/23/2020 27.0  20.0 - 31.0 mmol/L Final   ??? BUN 06/23/2020 8* 9 - 23 mg/dL Final   ??? Creatinine 06/23/2020 0.76  0.60 - 0.80 mg/dL Final   ??? BUN/Creatinine Ratio 06/23/2020 11   Final   ??? EGFR CKD-EPI Non-African American,* 06/23/2020 88  >=60 mL/min/1.59m2 Final   ??? EGFR CKD-EPI African American, Fem* 06/23/2020 >90  >=60 mL/min/1.32m2 Final   ??? Glucose 06/23/2020 164  70 - 179 mg/dL Final   ??? Calcium 16/08/9603 9.2  8.7 - 10.4 mg/dL Final   ??? Albumin 54/07/8118 3.5  3.4 - 5.0 g/dL Final   ??? Total Protein 06/23/2020 7.7  5.7 - 8.2 g/dL Final   ??? Total Bilirubin 06/23/2020 0.2* 0.3 - 1.2 mg/dL Final   ??? AST 14/78/2956 18  <=34 U/L Final   ??? ALT 06/23/2020 19  10 - 49 U/L Final   ??? Alkaline Phosphatase 06/23/2020 136* 46 - 116 U/L Final   ??? WBC 06/23/2020 7.0  4.5 - 11.0 10*9/L Final   ??? RBC 06/23/2020 4.16  4.00 - 5.20 10*12/L Final   ??? HGB 06/23/2020 11.9* 12.0 - 16.0 g/dL Final   ??? HCT 21/30/8657 37.3  36.0 - 46.0 % Final   ??? MCV 06/23/2020 89.5  80.0 - 100.0 fL Final   ??? MCH 06/23/2020 28.7  26.0 - 34.0 pg Final   ??? MCHC 06/23/2020 32.1  31.0 - 37.0 g/dL Final   ??? RDW 84/69/6295 14.6  12.0 - 15.0 % Final   ??? MPV 06/23/2020 9.4  7.0 - 10.0 fL Final   ??? Platelet 06/23/2020 342  150 - 440 10*9/L Final   ??? Neutrophils % 06/23/2020 59.6  % Final   ??? Lymphocytes % 06/23/2020 28.9  % Final   ??? Monocytes % 06/23/2020 7.9  % Final   ??? Eosinophils % 06/23/2020 0.1  % Final   ??? Basophils % 06/23/2020 0.4  % Final   ??? Absolute Neutrophils 06/23/2020 4.2  2.0 - 7.5 10*9/L Final   ??? Absolute Lymphocytes 06/23/2020 2.0  1.5 - 5.0 10*9/L Final   ??? Absolute Monocytes 06/23/2020 0.6  0.2 - 0.8 10*9/L Final   ??? Absolute Eosinophils 06/23/2020 0.0  0.0 - 0.4 10*9/L Final   ??? Absolute Basophils 06/23/2020 0.0  0.0 - 0.1 10*9/L Final   ??? Large Unstained Cells 06/23/2020 3  0 - 4 % Final   ??? Hypochromasia 06/23/2020 Slight* Not Present Final

## 2020-06-23 NOTE — Unmapped (Signed)
Patient in clinic for treatment.  Patient does not express any new concerns or complaints.  Patient tolerated treatment without incident.

## 2020-06-23 NOTE — Unmapped (Signed)
Breast Oncology Clinic    PCP: Pearline Cables, MD    Multidisciplinary team:  Surgical Oncology: Sherilyn Cooter, MD  Radiation Oncology: Thom Chimes, MD / Konrad Penta, NP  Plastic Surgery: None at this time  Genetics: None at this time    Reason for Visit: management of breast cancer    Assessment/Plan:    Frances Kennedy is a 54 y.o. woman with prediabetes and left breast cancer.    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  We discussed that her breast cancer is ER positive, and the lymph node biopsy was more positive than the initial breast biopsy. As such, I would not add immunotherapy in this patient given its lack of proven benefit in hormone receptor positive disease. I will use carboplatin given her locally advanced disease and ER only 5% in some specimens.     Port has been placed and baseline echocardiogram shows ejection fraction 60 to 65% and no significant valvular abnormalities.    We will proceed with dose-dense AC followed by weekly paclitaxel with carboplatin every 3 weeks, based on the findings of CALGB 04540 [facility] et al. Journal of Clinical Oncology, January 2015.) in which carboplatin or bevacizumab was added to paclitaxel, significantly increasing rates of pCR (60% vs 44% for carboplatin).     - doxorubicin 60 mg/m2 IV on day 1   - cyclophosphamide 600 mg/m2 IV on day 1  * cycled every 14 days for 4 cycles  * growth factor support with each cycle, per NCCN guidelines    Followed by   - paclitaxel 80 mg/m2 IV weekly for 12 weeks   - carboplatin AUC 5 every 3 weeks for 4 doses (starting AUC 5)  * Added ondansetron 24 mg po once and aprepitant (Cinvanti) 130 mg IV once to her pre-medications.   * Added creatinine to lab orders      Current therapy:   -- Due for C2 AC with Neulasta OnPro.  Biggest issue with C1 was N&V (see below) and continued diaphragm/chest wall pain (see below)   -- Labs reviewed and adequate for treatment.    -- Good clinical in-breast response thus far.      Chest wall seromas:   -- (L) sub-scapular area with healed incision, but there is fibrosis and seroma in this area that is very tender (there is (L) axillary seroma at site of incision as well). Pt is not interested in drainage right now.  Will send inbasket to Dr. Juliene Pina.    -- Update - Dr Juliene Pina would like [place] to page their team at next visit (9/2) to eval seromas when we see her in clinic next time     Chemo-induced nausea and vomiting:  -- Worse in days 3-5 or 6 post chemo.  Took Zofran/Dex post-chemo as directed. Took Compazine PRN.   -- Will start olanzapine 5 mg on night of chemo (tonight), then nightly for 4 nights post-chemo. Advised not to take at same time as oxycodone so as not to cause oversedation.    LLL pleural effusion/O2 requirement:   -- LLL diminished on exam. She is no longer using supplemental O2. Recent walking test showed she probably needs to continue O2 when up doing activities at home.  Reinforced this today.   -- O2 sat 97% on RA today in clinic.  -- (L) lateral chest wall at site of previous chest tube has dehisced and is open (see photo in physical exam). May need suture, but pt declines. Does not look infected. Will apply steri strips today instead. Advised if she has any oozing, pus, or fevers she should go to closest ED.       Adjuvant therapy: We discussed that we would recommend adjuvant endocrine therapy following completion of adjuvant radiation. In this patient, I will plan to treat with adjuvant AI (will confirm postmenopausal range FSH given s/p hysterectomy). I would consider the addition of abemaciclib per [facility]E data given her proven nodal involvement and concerns for mediastinal involvement based on PET Ct.    # Genetics  Patient had genetic testing with breast cancer stat panel with add-on ATM and CHEK2 gene sent to [facility].  She had a negative result.    # Bone health  Will need DEXA prior to starting AI.    # Fertility  Patient is status post hysterectomy.    # Supportive care  > Constipation: Improved. Manageable with stool softeners. Encouraged her to add Miralax to Colace if needed (with increase in opiate use).   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 Her chest wall pain is very bothersome and constant; 6-7/10 on pain scale with very little relief. Taking gabapentin; not taking oxycodone d/t drowsiness/constipation concerns. Encouraged her to try 1/2 pill if needed and increase stool softeners. Could try OTC analgesics (Tylenol/ibuprofen) as well.  Also discussed trial of OTC SalonPas lidocaine patch to intact skin along (L) chest wall to see if this helps with pain.  Advised not to take oxycodone at same time as olanzapine at bedtime until we see how she tolerates olanzapine.   > Psychosocial: Patient feels like she is coping generally well and denies any need for CCSP at this time.     # Goals of Care:  - Discussion of an advanced directive: Not addressed  - Patient preferences: To start treatment promptly.  - Support systems: Husband  - Palliative care referral: No immediate indication    # Caregiver support:  The patient's husband is her primary caregiver / support person. At this time, she remains fully independent in ADLs.     # COVID-19 precautions  [name] has received the COVID-19 vaccine, 2nd dose 02/19/20.         DISPO:   Shirleen Schirmer message sent to Dr. Juliene Pina with updates  -- RTC for f/u in 2 weeks with labs, visit with Dr. Rosalia Hammers, and chemo as scheduled.     (full AC course is scheduled appropriately; requested MD/APP visit for 9/30 for C1 Taxol/Carbo with scheduled orders)     I personally spent 60 minutes face-to-face and non-face-to-face in the care of this patient, which includes all pre, intra, and post visit time on the date of service.    *This is a shared visit evaluation with Dr. Wendall Papa.  Please see her Attending MD attestation for additional details.       --------------------------    History:  Frances Kennedy is a 54 y.o. woman with prediabetes who presents for evaluation of breast cancer.    -- due for C2 AC  -- worst sx started day 3-4 post chemo; N&V biggest issue. Confirmed that she took Zofran/Dex as scheduled post-chemo. Had 3-4 episodes of vomiting on worst  day.  It improved, then returned with certain scents with the return of nausea this past Saturday (1+ week after chemo)  -- denies mucositis  -- mild constipation at beginning of chemo course, but improved with colace and drinking more fluids   --  going to bed in pain and wakes up in pain from the diaphragm swelling ; starts at (L) lateral chest and wraps around to the front; taking gabapentin.  Trying not to take oxycodone. It's a dull constant ache all the time; pain scale 6-7/10; no worse than it's been but is very bothersome    -- no longer using supplemental O2; not using it at home and hasn't in past 2 weeks.  Showering is exerting and hse has to sit down to rest (rather than using O2).    -- per pt, Dr. Camillia Herter team contacted her and they'll see her on 9/7 per pt  -- no cough; no fevers   -- no headaches  -- not sleeping well, but more from pain than from steroid effect  -- we talk about olanzapine   -- could try Salonpas to chest wall for pain  -- has (L) lateral chest wall wound   -- port did ok; likes EMLA cream  -- otherwise ROS mild/stable except as above     ECOG Performance Status: 1; symptomatic; remains largely independent.      I have reviewed her records including history, imaging, pathology reports, and, when applicable, operative notes and summarized her oncologic history below:    Oncology History Overview Note   2021: Left breast cT1c cN1, IDC, G3, weakly ER+ (5%), PR-, HER2-     Malignant neoplasm of upper-inner quadrant of left female breast (CMS-HCC)    - 03/29/2020 Presenting Symptoms    Self-palpated left breast mass      03/29/2020 Interval Scan(s)    MMG with tomo: asymmetric density UIQ of left breast. Additional oval, circumscribed mass in the lateral left breast, middle depth. Right breast, multiple round circumscribed masses scattered centrally.     [place]: Left breast: irregular, hypoechoic mass, 10:00, 1.7 x 1.7 x 1.5 cm. Additional oval, circumscribed anechoic mass, 2:00 5 CFN measuring 0.4 x 0.4 x 0.2 cm. Left axilla: 2 abnormal LN.    Right breast: Intermediate 5 mm mass at 1:00, 2 CFN.      04/05/2020 Biopsy    1. Left breast USG core biopsy at 10:00: IDC, g3, ER weakly+ (5%), PR-, HER2 negative (1+)  2. Left axillary USG core biopsy: positive for carcinoma  3. Right breast USG core biopsy in the UIQ: intraductal papilloma      04/05/2020 Initial Diagnosis    Malignant neoplasm of upper-inner quadrant of left female breast (CMS-HCC)     04/19/2020 -  Cancer Staged    Staging form: Breast, AJCC 8th Edition  - Clinical stage from 04/19/2020: Stage IIB (cT1c, cN1(f), cM0, G3, ER+, PR-, HER2-) - Signed by Talbert Cage, DO on 04/19/2020       04/20/2020 Tumor Board    MDC recs: 54 yo female with left breast cT1c N1 IDC, G3, weakly +(5%), PR negative, HER2 negative.  1. NACT, ddAC-T  2. Genetic testing recommending as patient has additional FH. Will order stat panel today.  3. Savi placement in axillary LN x2 for TAD outback.   4. Path to request block to repeat receptors and confirm ER status  5. Staging studies     05/26/2020 Genetics    Patient had genetic testing  with breast cancer stat panel with add-on ATM and CHEK2 gene sent to [facility].  She had a negative result.     06/01/2020 Tumor Board    CT1cN3, biopsy confirms involvement of internal mammary node, but mediastinal node negative. Treat with curative intent therapy. Repeat receptors on node to determine if ER negative. If so, would add immunotherapy.    Internal mammary node: ER 40%, PR negative, HER2 negative  Proceed with NA ddAC-T plus carboplatin     06/09/2020 -  Chemotherapy    OP BREAST AC (DOSE DENSE) Q2W X 4 CYCLES, THEN PACLITAXEL WEEKLY / CARBOPLATIN EVERY 3 WEEKS FOR 12 WEEKS  DOXOrubicin 60 mg/m2 IV on day 1, cyclophosphamide 600 mg/m2 IV every 2 weeks for 4 cycles, THEN PACLitaxel 80 mg/m2 IV weekly, CARBOplatin AUC 6 IV every 3 weeks for 12 weeks       Gyn History: Status post hysterectomy, last menstrual period in 2016 prior to surgery, unsure if she has gone through menopause, no hot flashes or vaginal dryness      Social History     Social History Narrative    Married, 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      Husband had a stroke last year which affected his L side, he is R side dominant and relatively well recovered but not working.  He enjoys bowling in tournaments and pt will attend with him at times.      She also enjoys shopping (online and in person).    She has 6 children (3 sons, 3 daughters).       Physical Examination:  Vital Signs: BP 131/83  - Pulse 96  - Temp 36.2 ??C (97.1 ??F) (Temporal)  - Resp 16  - Ht 162.6 cm (5' 4)  - Wt 99.7 kg (219 lb 11.2 oz)  - SpO2 97%  - BMI 37.71 kg/m??   CONSTITUTIONAL: Pleasant, well-appearing woman in NAD  HEENT: Sclerae anicteric, conjunctiva clear; OP exam deferred given COVID precautions; wearing mask.   NECK: Supple  HEME/LYMPH: No palpable cervical, axillary, or supraclavicular lymphadenopathy  CV: Regular rate and rhythm; no LE edema.   RESP: LLL diminished, otherwise clear to auscultation bilat. Breathing non-labored.   GI: Soft, distended, tender to palpation to epigastric region extending to (L) rib/diaphragm region.   SKIN AND SUBCUTANEOUS TISSUES:  (L) lateral chest wall with ~3 cm dehisced surgical incision; no drainage/redness.  (L) axillary surgical incision well-healed, but large palpable seroma; very tender.  (L) scapular region scar well-healed, also with nodularity/possible seroma; tender.    BREAST: (L) breast with mass at 11:00,  ~12 cm from the nipple, measuring 1.5 x 1 cm in supine position (pre-chemo 3 x 4 cm). (R) breast dense, but without palpable mass.  Bilat breast reduction scars well-healed.   NEURO: Alert and oriented, speech intact, following commands, moving all extremities well, no focal deficits appreciated  PSYCH: Normal mood and appropriate affect    06/23/20 - Photos taken using [employer] app with verbal permission from patient.          Pertinent labs/imaging/pathology reviewed in detail.

## 2020-06-24 DIAGNOSIS — Z170 Estrogen receptor positive status [ER+]: Principal | ICD-10-CM

## 2020-06-24 DIAGNOSIS — T451X5A Adverse effect of antineoplastic and immunosuppressive drugs, initial encounter: Principal | ICD-10-CM

## 2020-06-24 DIAGNOSIS — C50212 Malignant neoplasm of upper-inner quadrant of left female breast: Principal | ICD-10-CM

## 2020-06-24 DIAGNOSIS — R112 Nausea with vomiting, unspecified: Principal | ICD-10-CM

## 2020-06-24 MED ORDER — OLANZAPINE 5 MG TABLET
ORAL_TABLET | 0 refills | 0 days
Start: 2020-06-24 — End: ?

## 2020-06-24 NOTE — Unmapped (Signed)
Please refill if appropriate

## 2020-06-24 NOTE — Unmapped (Signed)
Refill request for olanzapine. She should not need refills now, as she should have enough pills for current chemo course.

## 2020-07-05 NOTE — Unmapped (Unsigned)
Breast Oncology Clinic    PCP: Pearline Cables, MD    Multidisciplinary team:  Surgical Oncology: Sherilyn Cooter, MD  Radiation Oncology: Thom Chimes, MD / Konrad Penta, NP  Plastic Surgery: None at this time  Genetics: None at this time    Reason for Visit: management of breast cancer    Assessment/Plan:    [name] is a 54 y.o. woman with prediabetes and left breast cancer.    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 Journal of Clinical Oncology, January 2015.) in which carboplatin or bevacizumab was added to paclitaxel, significantly increasing rates of pCR (60% vs 44% for carboplatin).     - doxorubicin 60 mg/m2 IV on day 1   - cyclophosphamide 600 mg/m2 IV on day 1  * cycled every 14 days for 4 cycles  * growth factor support with each cycle, per NCCN guidelines    Followed by   - paclitaxel 80 mg/m2 IV weekly for 12 weeks   - carboplatin AUC 5 every 3 weeks for 4 doses (starting AUC 5)  * Added ondansetron 24 mg po once and aprepitant (Cinvanti) 130 mg IV once to her pre-medications.   * Added creatinine to lab orders      Current therapy:   -- Due for C3 AC with Neulasta OnPro.  Biggest issue with C1 was N&V (see below) and continued diaphragm/chest wall pain (see below)   -- Labs reviewed and adequate for treatment.    -- Good clinical in-breast response thus far.      Chest wall seromas:   -- (L) sub-scapular area with healed incision, but there is fibrosis and seroma in this area that is very tender (there is (L) axillary seroma at site of incision as well). Pt is not interested in drainage right now.  Will send inbasket to Dr. Juliene Pina.    -- Update - Dr Juliene Pina would like [place] to page their team at next visit (9/2) to eval seromas when we see her in clinic next time     Chemo-induced nausea and vomiting:  -- Continue Zofran/Dex post-chemo as directed.   -- Continue olanzapine 5 mg nightly on D1-4. Advised not to take at same time as oxycodone so as not to cause oversedation.  -- Continue Compazine prn    LLL pleural effusion/O2 requirement:   -- LLL diminished on exam. She is no longer using supplemental O2. Recent walking test showed she probably needs to continue O2 when up doing activities at home. Reinforced this today.   -- O2 sat 97% on RA today in clinic.  -- (L) lateral chest wall at site of previous chest tube has dehisced and is open (see photo in physical exam). May need suture, but pt declines. Does not look infected. Will apply steri strips today instead. Advised if she has any oozing, pus, or fevers she should go to closest ED.       Adjuvant therapy: We discussed that we would recommend adjuvant endocrine therapy following completion of adjuvant radiation. In this patient, I will plan to treat with adjuvant AI plus ovarian suppression. Her labs show that she is premenopausal. I would consider the addition of abemaciclib per [facility]E data  given her proven nodal involvement and concerns for mediastinal involvement based on PET Ct.    # Bone health  Will need DEXA prior to starting AI.    # Fertility  Patient is status post hysterectomy but has intact ovaries.     # Supportive care  > Constipation: Improved. Manageable with stool softeners. Encouraged her to add Miralax to Colace if needed (with increase in opiate use).   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 Her chest wall pain is very bothersome and constant; 6-7/10 on pain scale with very little relief. Taking gabapentin; not taking oxycodone d/t drowsiness/constipation concerns. Encouraged her to try 1/2 pill if needed and increase stool softeners. Could try OTC analgesics (Tylenol/ibuprofen) as well.  Also discussed trial of OTC SalonPas lidocaine patch to intact skin along (L) chest wall to see if this helps with pain.    > Psychosocial: Patient feels like she is coping generally well and denies any need for CCSP at this time.     # Goals of Care:  - Discussion of an advanced directive: Not addressed  - Patient preferences: To start treatment promptly.  - Support systems: Husband  - Palliative care referral: No immediate indication    # Caregiver support:  The patient's husband is her primary caregiver / support person. At this time, she remains fully independent in ADLs.     # COVID-19 precautions  [name] has received the COVID-19 vaccine, 2nd dose 02/19/20. We also discussed the recommendation for immunocompromised patients to receive a booster, if they received the [employer] or Moderna vaccine. The booster should be given at least 28 days following completion of the primary series, and if possible, patient should receive booster from same manufacturer as initial doses. ***    DISPO:   Future Appointments   Date Time Provider Department Center   07/07/2020  8:30 AM ADULT ONC LAB [facility]   07/07/2020  9:30 AM Gwinda Passe, MD [id number] [facility]   07/07/2020 10:30 AM ONCINF CHAIR 02 [id number] [facility]   07/12/2020  9:00 AM [place] PFT 3 [id number] [facility]   07/12/2020  9:45 AM [facility] DIAG RM 4 ID[facility] [place]   07/12/2020 10:00 AM Gita Werner Lean, MD SURONC [facility]   07/21/2020 11:30 AM ADULT ONC LAB [facility]   07/21/2020 12:30 PM Gretchen [place], NP [id number] [facility]   07/21/2020  1:30 PM ONCINF CHAIR 14 [id number] [facility]   08/04/2020 10:00 AM Gwinda Passe, MD [id number]        I personally spent 60 minutes face-to-face and non-face-to-face in the care of this patient, which includes all pre, intra, and post visit time on the date of service.    *This is a shared visit evaluation with Dr. Wendall Papa.  Please see her Attending MD attestation for additional details.       --------------------------    History:  [name] is a 54 y.o. woman with prediabetes who presents for evaluation of breast cancer.    -- due for C2 AC  -- worst sx started day 3-4 post chemo; N&V biggest issue. Confirmed that she took Zofran/Dex as scheduled post-chemo. Had 3-4 episodes of vomiting on worst day.  It improved, then returned with certain scents with the return of nausea this past Saturday (1+ week after chemo)  -- denies mucositis  -- mild constipation at beginning of chemo course, but  improved with colace and drinking more fluids   --  going to bed in pain and wakes up in pain from the diaphragm swelling ; starts at (L) lateral chest and wraps around to the front; taking gabapentin.  Trying not to take oxycodone. It's a dull constant ache all the time; pain scale 6-7/10; no worse than it's been but is very bothersome    -- no longer using supplemental O2; not using it at home and hasn't in past 2 weeks.  Showering is exerting and hse has to sit down to rest (rather than using O2).    -- per pt, Dr. Camillia Herter team contacted her and they'll see her on 9/7 per pt  -- no cough; no fevers   -- no headaches  -- not sleeping well, but more from pain than from steroid effect  -- we talk about olanzapine   -- could try Salonpas to chest wall for pain  -- has (L) lateral chest wall wound   -- port did ok; likes EMLA cream  -- otherwise ROS mild/stable except as above     ECOG Performance Status: 1; symptomatic; remains largely independent.      I have reviewed her records including history, imaging, pathology reports, and, when applicable, operative notes and summarized her oncologic history below:    Oncology History Overview Note   2021: Left breast cT1c cN1, IDC, G3, weakly ER+ (5%), PR-, HER2-     Malignant neoplasm of upper-inner quadrant of left female breast (CMS-HCC)    - 03/29/2020 Presenting Symptoms    Self-palpated left breast mass 03/29/2020 Interval Scan(s)    MMG with tomo: asymmetric density UIQ of left breast. Additional oval, circumscribed mass in the lateral left breast, middle depth. Right breast, multiple round circumscribed masses scattered centrally.     [place]: Left breast: irregular, hypoechoic mass, 10:00, 1.7 x 1.7 x 1.5 cm. Additional oval, circumscribed anechoic mass, 2:00 5 CFN measuring 0.4 x 0.4 x 0.2 cm. Left axilla: 2 abnormal LN.    Right breast: Intermediate 5 mm mass at 1:00, 2 CFN.      04/05/2020 Biopsy    1. Left breast USG core biopsy at 10:00: IDC, g3, ER weakly+ (5%), PR-, HER2 negative (1+)  2. Left axillary USG core biopsy: positive for carcinoma  3. Right breast USG core biopsy in the UIQ: intraductal papilloma      04/05/2020 Initial Diagnosis    Malignant neoplasm of upper-inner quadrant of left female breast (CMS-HCC)     04/19/2020 -  Cancer Staged    Staging form: Breast, AJCC 8th Edition  - Clinical stage from 04/19/2020: Stage IIB (cT1c, cN1(f), cM0, G3, ER+, PR-, HER2-) - Signed by Talbert Cage, DO on 04/19/2020       04/20/2020 Tumor Board    MDC recs: 54 yo female with left breast cT1c N1 IDC, G3, weakly +(5%), PR negative, HER2 negative.  1. NACT, ddAC-T  2. Genetic testing recommending as patient has additional FH. Will order stat panel today.  3. Savi placement in axillary LN x2 for TAD outback.   4. Path to request block to repeat receptors and confirm ER status  5. Staging studies     05/26/2020 Genetics    Patient had genetic testing with breast cancer stat panel with add-on ATM and CHEK2 gene sent to [facility].  She had a negative result.     06/01/2020 Tumor Board    CT1cN3, biopsy confirms involvement of internal mammary node, but mediastinal  node negative. Treat with curative intent therapy. Repeat receptors on node to determine if ER negative. If so, would add immunotherapy.    Internal mammary node: ER 40%, PR negative, HER2 negative  Proceed with NA ddAC-T plus carboplatin     06/09/2020 - Chemotherapy    OP BREAST AC (DOSE DENSE) Q2W X 4 CYCLES, THEN PACLITAXEL WEEKLY / CARBOPLATIN EVERY 3 WEEKS FOR 12 WEEKS  DOXOrubicin 60 mg/m2 IV on day 1, cyclophosphamide 600 mg/m2 IV every 2 weeks for 4 cycles, THEN PACLitaxel 80 mg/m2 IV weekly, CARBOplatin AUC 6 IV every 3 weeks for 12 weeks       Gyn History: Status post hysterectomy, last menstrual period in 2016 prior to surgery, unsure if she has gone through menopause, no hot flashes or vaginal dryness      Social History     Social History Narrative    Married, 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      Husband had a stroke last year which affected his L side, he is R side dominant and relatively well recovered but not working.  He enjoys bowling in tournaments and pt will attend with him at times.      She also enjoys shopping (online and in person).    She has 6 children (3 sons, 3 daughters).       Physical Examination:  Vital Signs: There were no vitals taken for this visit.  CONSTITUTIONAL: Pleasant, well-appearing woman in NAD  HEENT: Sclerae anicteric, conjunctiva clear; OP exam deferred given COVID precautions; wearing mask.   NECK: Supple  HEME/LYMPH: No palpable cervical, axillary, or supraclavicular lymphadenopathy  CV: Regular rate and rhythm; no LE edema.   RESP: LLL diminished, otherwise clear to auscultation bilat. Breathing non-labored.   GI: Soft, distended, tender to palpation to epigastric region extending to (L) rib/diaphragm region.   SKIN AND SUBCUTANEOUS TISSUES:  (L) lateral chest wall with ~3 cm dehisced surgical incision; no drainage/redness.  (L) axillary surgical incision well-healed, but large palpable seroma; very tender.  (L) scapular region scar well-healed, also with nodularity/possible seroma; tender.    BREAST: (L) breast with mass at 11:00,  ~12 cm from the nipple, measuring 1.5 x 1 cm in supine position (pre-chemo 3 x 4 cm). (R) breast dense, but without palpable mass.  Bilat breast reduction scars well-healed.   NEURO: Alert and oriented, speech intact, following commands, moving all extremities well, no focal deficits appreciated  PSYCH: Normal mood and appropriate affect      Pertinent labs/imaging/pathology reviewed in detail.

## 2020-07-07 ENCOUNTER — Ambulatory Visit: Admit: 2020-07-07 | Discharge: 2020-07-08 | Payer: PRIVATE HEALTH INSURANCE

## 2020-07-07 ENCOUNTER — Other Ambulatory Visit: Admit: 2020-07-07 | Discharge: 2020-07-08 | Payer: PRIVATE HEALTH INSURANCE

## 2020-07-07 ENCOUNTER — Ambulatory Visit
Admit: 2020-07-07 | Discharge: 2020-07-08 | Payer: PRIVATE HEALTH INSURANCE | Attending: Hematology & Oncology | Primary: Hematology & Oncology

## 2020-07-07 DIAGNOSIS — C50212 Malignant neoplasm of upper-inner quadrant of left female breast: Principal | ICD-10-CM

## 2020-07-07 DIAGNOSIS — Z170 Estrogen receptor positive status [ER+]: Principal | ICD-10-CM

## 2020-07-07 DIAGNOSIS — Z9889 Other specified postprocedural states: Principal | ICD-10-CM

## 2020-07-07 LAB — CBC W/ AUTO DIFF
BASOPHILS ABSOLUTE COUNT: 0 10*9/L (ref 0.0–0.1)
BASOPHILS RELATIVE PERCENT: 0.3 %
EOSINOPHILS ABSOLUTE COUNT: 0 10*9/L (ref 0.0–0.4)
EOSINOPHILS RELATIVE PERCENT: 0.2 %
HEMATOCRIT: 34.7 % — ABNORMAL LOW (ref 36.0–46.0)
LARGE UNSTAINED CELLS: 3 % (ref 0–4)
LYMPHOCYTES ABSOLUTE COUNT: 1.7 10*9/L (ref 1.5–5.0)
LYMPHOCYTES RELATIVE PERCENT: 29.4 %
MEAN CORPUSCULAR HEMOGLOBIN CONC: 31.3 g/dL (ref 31.0–37.0)
MEAN CORPUSCULAR HEMOGLOBIN: 28.2 pg (ref 26.0–34.0)
MEAN CORPUSCULAR VOLUME: 90.1 fL (ref 80.0–100.0)
MONOCYTES ABSOLUTE COUNT: 0.6 10*9/L (ref 0.2–0.8)
MONOCYTES RELATIVE PERCENT: 10.2 %
NEUTROPHILS ABSOLUTE COUNT: 3.3 10*9/L (ref 2.0–7.5)
NEUTROPHILS RELATIVE PERCENT: 56.9 %
PLATELET COUNT: 363 10*9/L (ref 150–440)
RED BLOOD CELL COUNT: 3.85 10*12/L — ABNORMAL LOW (ref 4.00–5.20)
RED CELL DISTRIBUTION WIDTH: 15.2 % — ABNORMAL HIGH (ref 12.0–15.0)
WBC ADJUSTED: 5.7 10*9/L (ref 4.5–11.0)

## 2020-07-07 LAB — LYMPHOCYTES RELATIVE PERCENT: Lymphocytes/100 leukocytes:NFr:Pt:Bld:Qn:Automated count: 29.4

## 2020-07-07 LAB — COMPREHENSIVE METABOLIC PANEL
ALBUMIN: 3.6 g/dL (ref 3.4–5.0)
ALKALINE PHOSPHATASE: 122 U/L — ABNORMAL HIGH (ref 46–116)
ALT (SGPT): 19 U/L (ref 10–49)
AST (SGOT): 18 U/L (ref <=34)
BILIRUBIN TOTAL: 0.2 mg/dL — ABNORMAL LOW (ref 0.3–1.2)
BLOOD UREA NITROGEN: 8 mg/dL — ABNORMAL LOW (ref 9–23)
BUN / CREAT RATIO: 11
CALCIUM: 9.4 mg/dL (ref 8.7–10.4)
CHLORIDE: 102 mmol/L (ref 98–107)
CO2: 27 mmol/L (ref 20.0–31.0)
CREATININE: 0.7 mg/dL
EGFR CKD-EPI AA FEMALE: >90 mL/min/{1.73_m2} (ref >=60)
EGFR CKD-EPI NON-AA FEMALE: >90 mL/min/{1.73_m2} (ref >=60)
GLUCOSE RANDOM: 190 mg/dL — ABNORMAL HIGH (ref 70–179)
POTASSIUM: 3.6 mmol/L (ref 3.4–4.5)
PROTEIN TOTAL: 7.5 g/dL (ref 5.7–8.2)
SODIUM: 136 mmol/L (ref 135–145)

## 2020-07-07 LAB — ANION GAP: Anion gap 3:SCnc:Pt:Ser/Plas:Qn:: 7

## 2020-07-07 MED ORDER — PROCHLORPERAZINE MALEATE 10 MG TABLET
ORAL_TABLET | Freq: Four times a day (QID) | ORAL | 2 refills | 8 days | Status: CP | PRN
Start: 2020-07-07 — End: ?

## 2020-07-07 MED ORDER — ONDANSETRON HCL 4 MG TABLET
ORAL_TABLET | Freq: Three times a day (TID) | ORAL | 2 refills | 5 days | Status: CP | PRN
Start: 2020-07-07 — End: ?

## 2020-07-07 MED ADMIN — cyclophosphamide (CYTOXAN) 1,308 mg in sodium chloride (NS) 0.9 % 250 mL IVPB: 600 mg/m2 | INTRAVENOUS | @ 18:00:00 | Stop: 2020-07-07

## 2020-07-07 MED ADMIN — pegfilgrastim (NEULASTA ONPRO) wearable injector 6 mg: 6 mg | SUBCUTANEOUS | @ 18:00:00 | Stop: 2020-07-07

## 2020-07-07 MED ADMIN — sodium chloride 0.9% (NS) bolus 500 mL: 500 mL | INTRAVENOUS | @ 16:00:00 | Stop: 2020-07-07

## 2020-07-07 MED ADMIN — DOXOrubicin (ADRIAMYCIN) syringe: 60 mg/m2 | INTRAVENOUS | @ 17:00:00 | Stop: 2020-07-07

## 2020-07-07 MED ADMIN — dexAMETHasone (DECADRON) tablet 12 mg: 12 mg | ORAL | @ 16:00:00 | Stop: 2020-07-07

## 2020-07-07 MED ADMIN — sodium chloride (NS) 0.9 % infusion: 100 mL/h | INTRAVENOUS | @ 17:00:00

## 2020-07-07 MED ADMIN — fosaprepitant (EMEND) 150 mg in sodium chloride (NS) 0.9 % 100 mL IVPB: 150 mg | INTRAVENOUS | @ 16:00:00 | Stop: 2020-07-07

## 2020-07-07 MED ADMIN — ondansetron (ZOFRAN) injection 8 mg: 8 mg | INTRAVENOUS | @ 16:00:00 | Stop: 2020-07-07

## 2020-07-07 NOTE — Unmapped (Signed)
Lab on 07/07/2020   Component Date Value Ref Range Status   ??? Sodium 07/07/2020 136  135 - 145 mmol/L Final   ??? Potassium 07/07/2020 3.6  3.4 - 4.5 mmol/L Final   ??? Chloride 07/07/2020 102  98 - 107 mmol/L Final   ??? Anion Gap 07/07/2020 7  5 - 14 mmol/L Final   ??? CO2 07/07/2020 27.0  20.0 - 31.0 mmol/L Final   ??? BUN 07/07/2020 8* 9 - 23 mg/dL Final   ??? Creatinine 07/07/2020 0.70  0.60 - 0.80 mg/dL Final   ??? BUN/Creatinine Ratio 07/07/2020 11   Final   ??? EGFR CKD-EPI Non-African American,* 07/07/2020 >90  >=60 mL/min/1.69m2 Final   ??? EGFR CKD-EPI African American, Fem* 07/07/2020 >90  >=60 mL/min/1.87m2 Final   ??? Glucose 07/07/2020 190* 70 - 179 mg/dL Final   ??? Calcium 16/08/9603 9.4  8.7 - 10.4 mg/dL Final   ??? Albumin 54/07/8118 3.6  3.4 - 5.0 g/dL Final   ??? Total Protein 07/07/2020 7.5  5.7 - 8.2 g/dL Final   ??? Total Bilirubin 07/07/2020 0.2* 0.3 - 1.2 mg/dL Final   ??? AST 14/78/2956 18  <=34 U/L Final   ??? ALT 07/07/2020 19  10 - 49 U/L Final   ??? Alkaline Phosphatase 07/07/2020 122* 46 - 116 U/L Final   ??? WBC 07/07/2020 5.7  4.5 - 11.0 10*9/L Final   ??? RBC 07/07/2020 3.85* 4.00 - 5.20 10*12/L Final   ??? HGB 07/07/2020 10.9* 12.0 - 16.0 g/dL Final   ??? HCT 21/30/8657 34.7* 36.0 - 46.0 % Final   ??? MCV 07/07/2020 90.1  80.0 - 100.0 fL Final   ??? MCH 07/07/2020 28.2  26.0 - 34.0 pg Final   ??? MCHC 07/07/2020 31.3  31.0 - 37.0 g/dL Final   ??? RDW 84/69/6295 15.2* 12.0 - 15.0 % Final   ??? MPV 07/07/2020 8.9  7.0 - 10.0 fL Final   ??? Platelet 07/07/2020 363  150 - 440 10*9/L Final   ??? Neutrophils % 07/07/2020 56.9  % Final   ??? Lymphocytes % 07/07/2020 29.4  % Final   ??? Monocytes % 07/07/2020 10.2  % Final   ??? Eosinophils % 07/07/2020 0.2  % Final   ??? Basophils % 07/07/2020 0.3  % Final   ??? Absolute Neutrophils 07/07/2020 3.3  2.0 - 7.5 10*9/L Final   ??? Absolute Lymphocytes 07/07/2020 1.7  1.5 - 5.0 10*9/L Final   ??? Absolute Monocytes 07/07/2020 0.6  0.2 - 0.8 10*9/L Final   ??? Absolute Eosinophils 07/07/2020 0.0  0.0 - 0.4 10*9/L Final   ??? Absolute Basophils 07/07/2020 0.0  0.0 - 0.1 10*9/L Final   ??? Large Unstained Cells 07/07/2020 3  0 - 4 % Final   ??? Hypochromasia 07/07/2020 Moderate* Not Present Final

## 2020-07-07 NOTE — Unmapped (Signed)
Chemotherapy Infusion Note       Chemotherapy Treatment Verification   Chemotherapy treatment orders, dose calculations, and lab work is verified to be within parameters by the following two chemotherapy competent personnel, as per [facility].      Infusion Assessment   Blood return was verified by second chemotherapy competent RN prior to start of each infusion.    The infusion rates were verified by a second RN.    Following the administration of premedications and normal saline bolus over 30 minutes, doxorubicin (Adriamycin) was verfied by Dedra Skeens, RN and administered over via slow IV push over 10 minutes with concurrent freeflowing normal saline and frequent pauses to verify blood return. Cyclophosphamide (Cytoxan) was verified by Dedra Skeens, RN and administered over 30 minutes.      Patient tolerated infusions without complications.     Blood return verified after completion of each infusion.     Port Flushed   Right chest port flushed with 20mL normal saline and 5mL of 500unit/mL heparin prior to deaccessing.     Side Effects   Patient instructed to notify RN pain, tenderness to the infusion site during treatment, nausea, vomiting, shortness of breath, itching, dizziness, generalized pain, flushing, swelling of lips and swelling of tongue.    On Body   Pegfilgrastim (Neulasta On Pro) was placed on patient's right arm and green light was flashing prior to discharge.       Patient discharged from infusion ambulatory, with all personal belongings, and without complaints of adverse effects.

## 2020-07-07 NOTE — Unmapped (Signed)
Breast Oncology Clinic    PCP: Pearline Cables, MD    Multidisciplinary team:  Surgical Oncology: Sherilyn Cooter, MD  Radiation Oncology: Thom Chimes, MD / Konrad Penta, NP  Plastic Surgery: None at this time  Genetics: None at this time    Reason for Visit: management of breast cancer    Assessment/Plan:    [name] is a 54 y.o. woman with prediabetes and left breast cancer.    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 Journal of Clinical Oncology, January 2015.) in which carboplatin or bevacizumab was added to paclitaxel, significantly increasing rates of pCR (60% vs 44% for carboplatin).     - doxorubicin 60 mg/m2 IV on day 1   - cyclophosphamide 600 mg/m2 IV on day 1  * cycled every 14 days for 4 cycles  * growth factor support with each cycle, per NCCN guidelines    Followed by   - paclitaxel 80 mg/m2 IV weekly for 12 weeks   - carboplatin AUC 5 every 3 weeks for 4 doses (starting AUC 5)  * Added ondansetron 24 mg po once and aprepitant (Cinvanti) 130 mg IV once to her pre-medications.   * Added creatinine to lab orders  * Currently no plan for repeat cross-section imaging prior to surgical intervention      Current therapy:   -- Due for C3 AC with Neulasta OnPro.  Biggest issue with C1 was N&V (see below) and continued diaphragm/chest wall pain (see below)   -- Labs reviewed and adequate for treatment.    -- Good clinical in-breast response thus far.      Chest wall seromas:   -- (L) sub-scapular area with healed incision, but there is fibrosis and seroma in this area that is tender (there is (L) axillary seroma at site of incision as well).   -- Dr. Juliene Pina evaluated the patient in-office today. There are no concerns for infection or seromas at this time. Dr. Juliene Pina will reschedule patient to FU with her on 07/21/20 for PFTs and 6 min walk test.     Chemo-induced nausea and vomiting:  -- Continue Zofran/Dex post-chemo as directed.   -- Continue olanzapine 5 mg nightly on D1-4. Advised not to take at same time as oxycodone so as not to cause oversedation.  -- Continue Compazine prn    LLL pleural effusion/O2 requirement:   -- LLL diminished on exam. She is no longer using supplemental O2. Recent walking test showed she probably needs to continue O2 when up doing activities at home. Reinforced this today.   -- O2 sat 98% on RA today in clinic.   -- (L) lateral chest wall at site of previous chest tube is well healed.       Adjuvant therapy: We discussed that we would recommend adjuvant endocrine therapy following completion of adjuvant radiation. In this patient, I will plan to treat with adjuvant AI plus ovarian suppression. Her labs show that she is premenopausal. I would consider the addition of abemaciclib per monarchE data given her proven nodal involvement and concerns for mediastinal involvement based on PET CT.    # Bone health  Will need DEXA prior to starting AI.    #  Fertility  Patient is status post hysterectomy but has intact ovaries.     # Supportive care  > Constipation: Improved. Manageable with stool softeners. Encouraged her to add Miralax to Colace if needed (with increase in opiate use).   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 Her chest wall pain is very bothersome and constant; 6-7/10 on pain scale with very little relief. Taking gabapentin; not taking oxycodone d/t drowsiness/constipation concerns. Encouraged her to try 1/2 pill if needed and increase stool softeners. Could try OTC analgesics (Tylenol/ibuprofen) as well.  Also discussed trial of OTC SalonPas lidocaine patch to intact skin along (L) chest wall to see if this helps with pain. Dr. Juliene Pina aware and following. She suggested adding flexeril 5mg  PRN for muscle spasms if gabapentin is insufficient.   > Insomnia: Counseled patient of behavioral modifications including stopping screen-time 2 hours before sleeping, and minimizing day-time naps. Discouraged patient from taking Lorazepam as primary therapy as this is not standard of care.    > Psychosocial: Patient feels like she is coping generally well and denies any need for CCSP at this time. Has a good support system in Husband and children.    # Goals of Care:  - Discussion of an advanced directive: Not addressed  - Patient preferences: To start treatment promptly.  - Support systems: Husband  - Palliative care referral: No immediate indication    # Caregiver support:  The patient's husband is her primary caregiver / support person. At this time, she remains fully independent in ADLs.     # COVID-19 precautions  [name] has received the COVID-19 vaccine, 2nd dose 02/19/20. We also discussed the recommendation for immunocompromised patients to receive a booster, if they received the [employer] or Moderna vaccine. The booster should be given at least 28 days following completion of the primary series, and if possible, patient should receive booster from same manufacturer as initial doses. Deferred at this time but ammendable to receive booster in the next coming months. We advised that our ideal timeframe would be prior to November 2021.    DISPO:   Future Appointments   Date Time Provider Department Center   07/07/2020  9:30 AM Gwinda Passe, MD [facility]   07/07/2020 10:30 AM ONCINF CHAIR 02 [id number] [facility]   07/12/2020  9:00 AM [place] PFT 3 [id number] [facility]   07/12/2020  9:45 AM [facility] DI[facility] RM 4 [facility] [place]   07/12/2020 10:00 AM Gita Werner Lean, MD SURONC [facility]   07/21/2020 11:30 AM ADULT ONC LAB [facility]   07/21/2020 12:30 PM Gretchen [place], NP [id number] 07/21/2020  1:30 PM ONCINF CHAIR 14 [id number]   08/04/2020 10:00 AM Gwinda Passe, MD [id number]        I personally spent 60 minutes face-to-face and non-face-to-face in the care of this patient, which includes all pre, intra, and post visit time on the date of service.    *This is a shared visit evaluation with Dr. Wendall Papa.  Please see her Attending MD attestation for additional details.       --------------------------    History:  [name] is a 54 y.o. woman with prediabetes who presents for evaluation of breast cancer.    -- due for C3 AC  -- endorses improvement of nausea with new regimen. Requesting refills today.    -- experiencing dry mouth and thrush. Everything  taste funny. She primarily has a lost of taste on the left side of tongue. Saline washes help relieve symptoms. Loss of taste interferes with her appetite. denies mucositis.  -- inability to sleep following the end of weekly steroid course. She states she sleeps from 5pm-10pm most days and stays up for the rest of the night. She would like to know if she can take lorazapam even if not taking steroids.  -- mild constipation at beginning of chemo course, but improved with colace and drinking more fluids   -- continues to endorse (L) lateral chest wall pain. starts at (L) lateral chest and wraps around to the front; taking gabapentin.  Trying not to take oxycodone. It's a dull constant ache all the time; pain scale 6-7/10; no worse than it's been but is very bothersome. Dr. Juliene Pina will see patient in clinic today.   -- (L) lateral chest wall wound healed from prior  -- no longer using supplemental O2; not using it at home and hasn't in past 2 weeks.  She states she experiences dyspnea on exertion but rests in between activities.  -- watery eyes and hair loss since last treatment   -- no cough; no fevers   -- no headaches  -- otherwise ROS mild/stable except as above     ECOG Performance Status: 1; symptomatic; remains largely independent.      I have reviewed her records including history, imaging, pathology reports, and, when applicable, operative notes and summarized her oncologic history below:    Oncology History Overview Note   2021: Left breast cT1c cN1, IDC, G3, weakly ER+ (5%), PR-, HER2-     Malignant neoplasm of upper-inner quadrant of left female breast (CMS-HCC)    - 03/29/2020 Presenting Symptoms    Self-palpated left breast mass      03/29/2020 Interval Scan(s)    MMG with tomo: asymmetric density UIQ of left breast. Additional oval, circumscribed mass in the lateral left breast, middle depth. Right breast, multiple round circumscribed masses scattered centrally.     [place]: Left breast: irregular, hypoechoic mass, 10:00, 1.7 x 1.7 x 1.5 cm. Additional oval, circumscribed anechoic mass, 2:00 5 CFN measuring 0.4 x 0.4 x 0.2 cm. Left axilla: 2 abnormal LN.    Right breast: Intermediate 5 mm mass at 1:00, 2 CFN.      04/05/2020 Biopsy    1. Left breast USG core biopsy at 10:00: IDC, g3, ER weakly+ (5%), PR-, HER2 negative (1+)  2. Left axillary USG core biopsy: positive for carcinoma  3. Right breast USG core biopsy in the UIQ: intraductal papilloma      04/05/2020 Initial Diagnosis    Malignant neoplasm of upper-inner quadrant of left female breast (CMS-HCC)     04/19/2020 -  Cancer Staged    Staging form: Breast, AJCC 8th Edition  - Clinical stage from 04/19/2020: Stage IIB (cT1c, cN1(f), cM0, G3, ER+, PR-, HER2-) - Signed by Talbert Cage, DO on 04/19/2020       04/20/2020 Tumor Board    MDC recs: 54 yo female with left breast cT1c N1 IDC, G3, weakly +(5%), PR negative, HER2 negative.  1. NACT, ddAC-T  2. Genetic testing recommending as patient has additional FH. Will order stat panel today.  3. Savi placement in axillary LN x2 for TAD outback.   4. Path to request block to repeat receptors and confirm ER status  5. Staging studies     05/26/2020 Genetics    Patient had genetic testing with  breast cancer stat panel with add-on ATM and CHEK2 gene sent to [facility].  She had a negative result.     06/01/2020 Tumor Board    CT1cN3, biopsy confirms involvement of internal mammary node, but mediastinal node negative. Treat with curative intent therapy. Repeat receptors on node to determine if ER negative. If so, would add immunotherapy.    Internal mammary node: ER 40%, PR negative, HER2 negative  Proceed with NA ddAC-T plus carboplatin     06/09/2020 -  Chemotherapy    OP BREAST AC (DOSE DENSE) Q2W X 4 CYCLES, THEN PACLITAXEL WEEKLY / CARBOPLATIN EVERY 3 WEEKS FOR 12 WEEKS  DOXOrubicin 60 mg/m2 IV on day 1, cyclophosphamide 600 mg/m2 IV every 2 weeks for 4 cycles, THEN PACLitaxel 80 mg/m2 IV weekly, CARBOplatin AUC 6 IV every 3 weeks for 12 weeks       Gyn History: Status post hysterectomy, last menstrual period in 2016 prior to surgery, unsure if she has gone through menopause, no hot flashes or vaginal dryness      Social History     Social History Narrative    Married, 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      Husband had a stroke last year which affected his L side, he is R side dominant and relatively well recovered but not working.  He enjoys bowling in tournaments and pt will attend with him at times.      She also enjoys shopping (online and in person).    She has 6 children (3 sons, 3 daughters).       Physical Examination:  Vital Signs: BP 123/85  - Pulse 106  - Temp 36.1 ??C (97 ??F) (Oral)  - Resp 17  - Ht 162.6 cm (5' 4.02)  - Wt 99 kg (218 lb 3.2 oz)  - SpO2 98%  - BMI 37.44 kg/m??   CONSTITUTIONAL: Pleasant, well-appearing woman in NAD  HEENT: Sclerae anicteric, conjunctiva clear; Dry mucous membranes.  NECK: Supple  HEME/LYMPH: No palpable cervical, axillary, or supraclavicular lymphadenopathy  CV: Regular rate and rhythm; no LE edema.   RESP: LLL diminished, otherwise clear to auscultation bilat. Breathing non-labored.   GI: Soft, distended, tender to palpation to epigastric region extending to (L) rib/diaphragm region.   SKIN AND SUBCUTANEOUS TISSUES:  (L) lateral chest wall with ~3 cm healed surgical incision; no drainage/redness.  (L) axillary surgical incision well-healed, but large palpable seroma; very tender.  (L) scapular region scar well-healed, also with nodularity/possible seroma; tender.    BREAST: (L) breast with fullness and less discreet mass at 11:00,  ~12 cm from the nipple, measuring 1 x 1 cm in supine position (pre-chemo 3 x 4 cm). (R) breast dense, but without palpable mass.  Bilat breast reduction scars well-healed.   NEURO: Alert and oriented, speech intact, following commands, moving all extremities well, no focal deficits appreciated  PSYCH: Normal mood and appropriate affect      Pertinent labs/imaging/pathology reviewed in detail.     Virgie Dad, MS4

## 2020-07-07 NOTE — Unmapped (Signed)
Patient arrived for lab draw and port access. Tolerated well. Labs collected and sent.

## 2020-07-07 NOTE — Unmapped (Signed)
Provided paper copies of both the patient's FMLA and the patient's daughter's FMLA to the patient. Reiterated that the patient's FMLA had been submitted on her behalf, but the daughter's FMLA had not been submitted. Paper copies are under the media file, should we need them in the future. She and husband verbalized understanding.

## 2020-07-08 NOTE — Unmapped (Signed)
I recommend that you receive the COVID booster.  ------------------------    For appointments & urgent questions Monday through Friday 8 AM???5 PM:  Please call [phone]    Your medical oncologist is: Dr. Wendall Papa  Your medical oncology nurse practioner is: Lubertha Basque, NP  Your nurse navigator is: Dan Maker, RN  Your clinical pharmacist is: Dr. Raelene Bott    For urgent clinical needs on Nights, Weekends or Holidays:  Call [phone] and ask for the oncologist on call.   For appointment changes, please contact us during normal business hours, as we do not have staff to assist with scheduling after hours.     You can also message your team using [email or web address]. This is for non-urgent messages only. It may take 24-48 hours for your team to answer you. For non-urgent questions, please consider waiting until your next clinic visit to discuss with your medical team.    ---------------------  Please visit PrivacyFever.cz, a resource created just for family members and caregivers.  This website lists support services, how and where to ask for help. It has tools to assist you as you help [place] care for your loved one.      [facility]  [address]  [place], [place] [address]  [email or web address]

## 2020-07-21 ENCOUNTER — Ambulatory Visit: Admit: 2020-07-21 | Discharge: 2020-07-21 | Payer: PRIVATE HEALTH INSURANCE

## 2020-07-21 ENCOUNTER — Other Ambulatory Visit: Admit: 2020-07-21 | Discharge: 2020-07-21 | Payer: PRIVATE HEALTH INSURANCE

## 2020-07-21 ENCOUNTER — Ambulatory Visit
Admit: 2020-07-21 | Discharge: 2020-07-21 | Payer: PRIVATE HEALTH INSURANCE | Attending: Adult Health | Primary: Adult Health

## 2020-07-21 DIAGNOSIS — Z170 Estrogen receptor positive status [ER+]: Secondary | ICD-10-CM

## 2020-07-21 DIAGNOSIS — C50212 Malignant neoplasm of upper-inner quadrant of left female breast: Principal | ICD-10-CM

## 2020-07-21 DIAGNOSIS — R000 Tachycardia, unspecified: Principal | ICD-10-CM

## 2020-07-21 LAB — CBC W/ AUTO DIFF
BASOPHILS ABSOLUTE COUNT: 0 10*9/L (ref 0.0–0.1)
BASOPHILS RELATIVE PERCENT: 0.4 %
EOSINOPHILS ABSOLUTE COUNT: 0 10*9/L (ref 0.0–0.4)
EOSINOPHILS RELATIVE PERCENT: 0.2 %
HEMATOCRIT: 32.9 % — ABNORMAL LOW (ref 36.0–46.0)
HEMOGLOBIN: 10.7 g/dL — ABNORMAL LOW (ref 12.0–16.0)
LARGE UNSTAINED CELLS: 2 % (ref 0–4)
LYMPHOCYTES ABSOLUTE COUNT: 1.7 10*9/L (ref 1.5–5.0)
LYMPHOCYTES RELATIVE PERCENT: 23.9 %
MEAN CORPUSCULAR HEMOGLOBIN CONC: 32.5 g/dL (ref 31.0–37.0)
MEAN CORPUSCULAR HEMOGLOBIN: 29.3 pg (ref 26.0–34.0)
MONOCYTES ABSOLUTE COUNT: 0.7 10*9/L (ref 0.2–0.8)
NEUTROPHILS ABSOLUTE COUNT: 4.5 10*9/L (ref 2.0–7.5)
NEUTROPHILS RELATIVE PERCENT: 64.1 %
NUCLEATED RED BLOOD CELLS: 1 /100{WBCs} (ref <=4)
PLATELET COUNT: 295 10*9/L (ref 150–440)
RED BLOOD CELL COUNT: 3.64 10*12/L — ABNORMAL LOW (ref 4.00–5.20)
RED CELL DISTRIBUTION WIDTH: 16.1 % — ABNORMAL HIGH (ref 12.0–15.0)
WBC ADJUSTED: 7 10*9/L (ref 4.5–11.0)

## 2020-07-21 LAB — COMPREHENSIVE METABOLIC PANEL
ALBUMIN: 3.8 g/dL (ref 3.4–5.0)
ALT (SGPT): 15 U/L (ref 10–49)
ANION GAP: 6 mmol/L (ref 5–14)
AST (SGOT): 17 U/L (ref <=34)
BILIRUBIN TOTAL: 0.2 mg/dL — ABNORMAL LOW (ref 0.3–1.2)
BLOOD UREA NITROGEN: 9 mg/dL (ref 9–23)
BUN / CREAT RATIO: 10
CALCIUM: 9.6 mg/dL (ref 8.7–10.4)
CHLORIDE: 103 mmol/L (ref 98–107)
CO2: 28 mmol/L (ref 20.0–31.0)
CREATININE: 0.87 mg/dL
EGFR CKD-EPI AA FEMALE: 86 mL/min/{1.73_m2} (ref >=60)
EGFR CKD-EPI NON-AA FEMALE: 75 mL/min/{1.73_m2} (ref >=60)
GLUCOSE RANDOM: 169 mg/dL (ref 70–179)
POTASSIUM: 3.7 mmol/L (ref 3.4–4.5)
PROTEIN TOTAL: 7.6 g/dL (ref 5.7–8.2)
SODIUM: 137 mmol/L (ref 135–145)

## 2020-07-21 LAB — PROTEIN TOTAL: Protein:MCnc:Pt:Ser/Plas:Qn:: 7.6

## 2020-07-21 LAB — NEUTROPHILS RELATIVE PERCENT: Neutrophils/100 leukocytes:NFr:Pt:Bld:Qn:Automated count: 64.1

## 2020-07-21 LAB — TOXIC GRANULATION

## 2020-07-21 MED ADMIN — ondansetron (ZOFRAN) tablet 24 mg: 24 mg | ORAL | @ 18:00:00 | Stop: 2020-07-21

## 2020-07-21 MED ADMIN — sodium chloride 0.9% (NS) bolus 500 mL: 500 mL | INTRAVENOUS | @ 18:00:00 | Stop: 2020-07-21

## 2020-07-21 MED ADMIN — cyclophosphamide (CYTOXAN) 1,308 mg in sodium chloride (NS) 0.9 % 250 mL IVPB: 600 mg/m2 | INTRAVENOUS | @ 20:00:00 | Stop: 2020-07-21

## 2020-07-21 MED ADMIN — dexAMETHasone (DECADRON) tablet 12 mg: 12 mg | ORAL | @ 18:00:00 | Stop: 2020-07-21

## 2020-07-21 MED ADMIN — fosaprepitant (EMEND) 150 mg in sodium chloride (NS) 0.9 % 100 mL IVPB: 150 mg | INTRAVENOUS | @ 18:00:00 | Stop: 2020-07-21

## 2020-07-21 MED ADMIN — heparin, porcine (PF) 100 unit/mL injection 500 Units: 500 [IU] | INTRAVENOUS | @ 20:00:00 | Stop: 2020-07-22

## 2020-07-21 MED ADMIN — sodium chloride (NS) 0.9 % infusion: 100 mL/h | INTRAVENOUS | @ 19:00:00

## 2020-07-21 MED ADMIN — DOXOrubicin (ADRIAMYCIN) syringe: 60 mg/m2 | INTRAVENOUS | @ 19:00:00 | Stop: 2020-07-21

## 2020-07-21 MED ADMIN — pegfilgrastim (NEULASTA ONPRO) wearable injector 6 mg: 6 mg | SUBCUTANEOUS | @ 20:00:00 | Stop: 2020-07-21

## 2020-07-21 NOTE — Unmapped (Deleted)
Breast Oncology Clinic    PCP: Pearline Cables, MD    Multidisciplinary team:  Surgical Oncology: Sherilyn Cooter, MD  Radiation Oncology: Thom Chimes, MD / Konrad Penta, NP  Plastic Surgery: None at this time  Genetics: None at this time    Reason for Visit: management of breast cancer    Assessment/Plan:    [name] is a 54 y.o. woman with prediabetes and left breast cancer.    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  We discussed that her breast cancer is ER positive, and the lymph node biopsy was more positive than the initial breast biopsy. As such, I would not add immunotherapy in this patient given its lack of proven benefit in hormone receptor positive disease. I will use carboplatin given her locally advanced disease and ER only 5% in some specimens.     Port has been placed and baseline echocardiogram shows ejection fraction 60 to 65% and no significant valvular abnormalities.    We will proceed with dose-dense AC followed by weekly paclitaxel with carboplatin every 3 weeks, based on the findings of [facility] [id number] [facility] et al. Journal of Clinical Oncology, January 2015.) in which carboplatin or bevacizumab was added to paclitaxel, significantly increasing rates of pCR (60% vs 44% for carboplatin).     - doxorubicin 60 mg/m2 IV on day 1   - cyclophosphamide 600 mg/m2 IV on day 1  * cycled every 14 days for 4 cycles  * growth factor support with each cycle, per NCCN guidelines    Followed by   - paclitaxel 80 mg/m2 IV weekly for 12 weeks   - carboplatin AUC 5 every 3 weeks for 4 doses (starting AUC 5)  * Added ondansetron 24 mg po once and aprepitant (Cinvanti) 130 mg IV once to her pre-medications.   * Added creatinine to lab orders      Current therapy:   -- Due for C2 AC with Neulasta OnPro.  Biggest issue with C1 was N&V (see below) and continued diaphragm/chest wall pain (see below)   -- Labs reviewed and adequate for treatment.    -- Good clinical in-breast response thus far.      Chest wall seromas:   -- (L) sub-scapular area with healed incision, but there is fibrosis and seroma in this area that is very tender (there is (L) axillary seroma at site of incision as well). Pt is not interested in drainage right now.  Will send inbasket to Dr. Juliene Pina.    -- Update - Dr Juliene Pina would like [place] to page their team at next visit (9/2) to eval seromas when we see her in clinic next time     Chemo-induced nausea and vomiting:  -- Worse in days 3-5 or 6 post chemo.  Took Zofran/Dex post-chemo as directed. Took Compazine PRN.   -- Will start olanzapine 5 mg on night of chemo (tonight), then nightly for 4 nights post-chemo. Advised not to take at same time as oxycodone so as not to cause oversedation.    LLL pleural effusion/O2 requirement:   -- LLL diminished on exam. She is no longer using supplemental O2. Recent walking test showed she probably needs to continue O2 when up doing activities at home.  Reinforced this today.   -- O2 sat 97% on RA today in clinic.  -- (L) lateral chest wall at site of previous chest tube has dehisced and is open (see photo in physical exam). May need suture, but pt declines. Does not look infected. Will apply steri strips today instead. Advised if she has any oozing, pus, or fevers she should go to closest ED.       Adjuvant therapy: We discussed that we would recommend adjuvant endocrine therapy following completion of adjuvant radiation. In this patient, I will plan to treat with adjuvant AI (will confirm postmenopausal range FSH given s/p hysterectomy). I would consider the addition of abemaciclib per [facility]E data given her proven nodal involvement and concerns for mediastinal involvement based on PET Ct.    # Genetics  Patient had genetic testing with breast cancer stat panel with add-on ATM and CHEK2 gene sent to [facility].  She had a negative result.    # Bone health  Will need DEXA prior to starting AI.    # Fertility  Patient is status post hysterectomy.    # Supportive care  > Constipation: Improved. Manageable with stool softeners. Encouraged her to add Miralax to Colace if needed (with increase in opiate use).   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 Her chest wall pain is very bothersome and constant; 6-7/10 on pain scale with very little relief. Taking gabapentin; not taking oxycodone d/t drowsiness/constipation concerns. Encouraged her to try 1/2 pill if needed and increase stool softeners. Could try OTC analgesics (Tylenol/ibuprofen) as well.  Also discussed trial of OTC SalonPas lidocaine patch to intact skin along (L) chest wall to see if this helps with pain.  Advised not to take oxycodone at same time as olanzapine at bedtime until we see how she tolerates olanzapine.   > Psychosocial: Patient feels like she is coping generally well and denies any need for CCSP at this time.     # Goals of Care:  - Discussion of an advanced directive: Not addressed  - Patient preferences: To start treatment promptly.  - Support systems: Husband  - Palliative care referral: No immediate indication    # Caregiver support:  The patient's husband is her primary caregiver / support person. At this time, she remains fully independent in ADLs.     # COVID-19 precautions  [name] has received the COVID-19 vaccine, 2nd dose 02/19/20.         DISPO:   Shirleen Schirmer message sent to Dr. Juliene Pina with updates  -- RTC for f/u in 2 weeks with labs, visit with Dr. Rosalia Hammers, and chemo as scheduled.     (full AC course is scheduled appropriately; requested MD/APP visit for 9/30 for C1 Taxol/Carbo with scheduled orders)     I personally spent 60 minutes face-to-face and non-face-to-face in the care of this patient, which includes all pre, intra, and post visit time on the date of service.    *This is a shared visit evaluation with Dr. Wendall Papa.  Please see her Attending MD attestation for additional details.       --------------------------    History:  [name] is a 54 y.o. woman with prediabetes who presents for evaluation of breast cancer.    -- due for C2 AC  -- worst sx started day 3-4 post chemo; N&V biggest issue. Confirmed that she took Zofran/Dex as scheduled post-chemo. Had 3-4 episodes of vomiting on worst  day.  It improved, then returned with certain scents with the return of nausea this past Saturday (1+ week after chemo)  -- denies mucositis  -- mild constipation at beginning of chemo course, but improved with colace and drinking more fluids   --  going to bed in pain and wakes up in pain from the diaphragm swelling ; starts at (L) lateral chest and wraps around to the front; taking gabapentin.  Trying not to take oxycodone. It's a dull constant ache all the time; pain scale 6-7/10; no worse than it's been but is very bothersome    -- no longer using supplemental O2; not using it at home and hasn't in past 2 weeks.  Showering is exerting and hse has to sit down to rest (rather than using O2).    -- per pt, Dr. Camillia Herter team contacted her and they'll see her on 9/7 per pt  -- no cough; no fevers   -- no headaches  -- not sleeping well, but more from pain than from steroid effect  -- we talk about olanzapine   -- could try Salonpas to chest wall for pain  -- has (L) lateral chest wall wound   -- port did ok; likes EMLA cream  -- otherwise ROS mild/stable except as above     ECOG Performance Status: 1; symptomatic; remains largely independent.      I have reviewed her records including history, imaging, pathology reports, and, when applicable, operative notes and summarized her oncologic history below:    Oncology History Overview Note   2021: Left breast cT1c cN1, IDC, G3, weakly ER+ (5%), PR-, HER2-     Malignant neoplasm of upper-inner quadrant of left female breast (CMS-HCC)    - 03/29/2020 Presenting Symptoms    Self-palpated left breast mass      03/29/2020 Interval Scan(s)    MMG with tomo: asymmetric density UIQ of left breast. Additional oval, circumscribed mass in the lateral left breast, middle depth. Right breast, multiple round circumscribed masses scattered centrally.     [place]: Left breast: irregular, hypoechoic mass, 10:00, 1.7 x 1.7 x 1.5 cm. Additional oval, circumscribed anechoic mass, 2:00 5 CFN measuring 0.4 x 0.4 x 0.2 cm. Left axilla: 2 abnormal LN.    Right breast: Intermediate 5 mm mass at 1:00, 2 CFN.      04/05/2020 Biopsy    1. Left breast USG core biopsy at 10:00: IDC, g3, ER weakly+ (5%), PR-, HER2 negative (1+)  2. Left axillary USG core biopsy: positive for carcinoma  3. Right breast USG core biopsy in the UIQ: intraductal papilloma      04/05/2020 Initial Diagnosis    Malignant neoplasm of upper-inner quadrant of left female breast (CMS-HCC)     04/19/2020 -  Cancer Staged    Staging form: Breast, AJCC 8th Edition  - Clinical stage from 04/19/2020: Stage IIB (cT1c, cN1(f), cM0, G3, ER+, PR-, HER2-) - Signed by Talbert Cage, DO on 04/19/2020       04/20/2020 Tumor Board    MDC recs: 54 yo female with left breast cT1c N1 IDC, G3, weakly +(5%), PR negative, HER2 negative.  1. NACT, ddAC-T  2. Genetic testing recommending as patient has additional FH. Will order stat panel today.  3. Savi placement in axillary LN x2 for TAD outback.   4. Path to request block to repeat receptors and confirm ER status  5. Staging studies     05/26/2020 Genetics    Patient had genetic testing  with breast cancer stat panel with add-on ATM and CHEK2 gene sent to [facility].  She had a negative result.     06/01/2020 Tumor Board    CT1cN3, biopsy confirms involvement of internal mammary node, but mediastinal node negative. Treat with curative intent therapy. Repeat receptors on node to determine if ER negative. If so, would add immunotherapy.    Internal mammary node: ER 40%, PR negative, HER2 negative  Proceed with NA ddAC-T plus carboplatin     06/09/2020 -  Chemotherapy    OP BREAST AC (DOSE DENSE) Q2W X 4 CYCLES, THEN PACLITAXEL WEEKLY / CARBOPLATIN EVERY 3 WEEKS FOR 12 WEEKS  DOXOrubicin 60 mg/m2 IV on day 1, cyclophosphamide 600 mg/m2 IV every 2 weeks for 4 cycles, THEN PACLitaxel 80 mg/m2 IV weekly, CARBOplatin AUC 6 IV every 3 weeks for 12 weeks       Gyn History: Status post hysterectomy, last menstrual period in 2016 prior to surgery, unsure if she has gone through menopause, no hot flashes or vaginal dryness      Social History     Social History Narrative    Married, 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      Husband had a stroke last year which affected his L side, he is R side dominant and relatively well recovered but not working.  He enjoys bowling in tournaments and pt will attend with him at times.      She also enjoys shopping (online and in person).    She has 6 children (3 sons, 3 daughters).       Physical Examination:  Vital Signs: There were no vitals taken for this visit.  CONSTITUTIONAL: Pleasant, well-appearing woman in NAD  HEENT: Sclerae anicteric, conjunctiva clear; OP exam deferred given COVID precautions; wearing mask.   NECK: Supple  HEME/LYMPH: No palpable cervical, axillary, or supraclavicular lymphadenopathy  CV: Regular rate and rhythm; no LE edema.   RESP: LLL diminished, otherwise clear to auscultation bilat. Breathing non-labored.   GI: Soft, distended, tender to palpation to epigastric region extending to (L) rib/diaphragm region.   SKIN AND SUBCUTANEOUS TISSUES:  (L) lateral chest wall with ~3 cm dehisced surgical incision; no drainage/redness.  (L) axillary surgical incision well-healed, but large palpable seroma; very tender. (L) scapular region scar well-healed, also with nodularity/possible seroma; tender.    BREAST: (L) breast with mass at 11:00,  ~12 cm from the nipple, measuring 1.5 x 1 cm in supine position (pre-chemo 3 x 4 cm). (R) breast dense, but without palpable mass.  Bilat breast reduction scars well-healed.   NEURO: Alert and oriented, speech intact, following commands, moving all extremities well, no focal deficits appreciated  PSYCH: Normal mood and appropriate affect    07/21/20 - Photos taken using [employer] app with verbal permission from patient.          Pertinent labs/imaging/pathology reviewed in detail.

## 2020-07-21 NOTE — Unmapped (Signed)
Breast Oncology Clinic    PCP: Pearline Cables, MD    Multidisciplinary team:  Surgical Oncology: Sherilyn Cooter, MD  Radiation Oncology: Thom Chimes, MD / Konrad Penta, NP  Plastic Surgery: None at this time  Genetics: None at this time    Reason for Visit: management of breast cancer    Assessment/Plan:    [name] is a 54 y.o. woman with prediabetes and left breast cancer.    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 Journal of Clinical Oncology, January 2015.) in which carboplatin or bevacizumab was added to paclitaxel, significantly increasing rates of pCR (60% vs 44% for carboplatin).     - doxorubicin 60 mg/m2 IV on day 1   - cyclophosphamide 600 mg/m2 IV on day 1  * cycled every 14 days for 4 cycles  * growth factor support with each cycle, per NCCN guidelines    Followed by   - paclitaxel 80 mg/m2 IV weekly for 12 weeks   - carboplatin AUC 5 every 3 weeks for 4 doses (starting AUC 5)  * Added ondansetron 24 mg po once and aprepitant (Cinvanti) 130 mg IV once to her pre-medications.   * Added creatinine to lab orders  * Currently no plan for repeat cross-section imaging prior to surgical intervention      Current therapy:   -- Due for C4 AC with Neulasta OnPro.  Labs reviewed and adequate for treatment.  -- Good clinical in-breast response thus far.   -- Will return in 2 weeks to start weekly Taxol. We talked briefly today about expected toxicities of Taxol and supportive medicine regimen; encouraged her to consider cryotherapy to hands/feet during Taxol infusions to reduce risk of CIPN.  Will ask Aimee [place], CPP to call pt sometime in next 2 weeks for additional Taxol counseling.     Symptomatic tachycardia with irregular rhythm  -- Prior to visit today, pt had PFTs and CXR, ordered by CT surgery (Dr Juliene Pina) team.   -- I received call from [place], RRT in PFT lab that during 6-min walk test today, pt had irregular HR varying from 100s-170s. Pt reported SOB, dizziness, and heart fluttering sensation during this time. Per [place], she had no episodes of O2 desaturation during this time and BP was stable; she did not have any nausea or diaphoresis.   -- Pt noted that she's been experiencing intermittent DOE, palpitations, diaphoresis, and lightheadedness when she is up doing chores, exerting herself at home.  She applied home O2 on one of those episodes and felt somewhat improved.  Her sx also improve with rest.    -- Cardiac physical exam with irregular rhythm and tachycardia. Stat EKG obtained during clinic today revealing PACs. Discussed with Dr. Rosalia Hammers, who recommends Ziopatch for monitoring over period of time for better diagnostic eval for possible tachyarrhythmia.  Dr. Rosalia Hammers to order Ziopatch.         Chest wall seromas:   -- (L) sub-scapular area with healed incision, but there is fibrosis and seroma in this area that is tender (there is (L) axillary seroma at site of incision as well).   -- Dr. Juliene Pina evaluated the patient in-office 2 weeks ago.  Chemo-induced nausea and vomiting:  -- Continue Zofran/Dex post-chemo as directed.   -- Continue olanzapine 5 mg nightly on D1-4. Advised not to take at same time as oxycodone so as not to cause oversedation. Helpful; continue for final AC cycle. Likely will not need with Taxol course.   -- Continue Compazine prn    LLL pleural effusion/O2 requirement:   -- LLL diminished on exam. She is no longer using supplemental O2. Recent walking test showed she probably needs to continue O2 when up doing activities at home. Reinforced this again today.   -- O2 sat 99% on RA today in clinic.   -- CXR today 07/21/20 revealed now trace (L) pleural effusion. -- PFT results are pending; will defer to Dr [facility] team for interpretation and management of results.    -- (L) lateral chest wall at site of previous chest tube is now well healed/wound closed.   -- Previous documentation noted that Dr. Juliene Pina was planning to f/u with pt today (9/16) after repeat PFTs/CXR, but this appt was canceled by his team.  Unclear if it needs to be rescheduled??       Adjuvant therapy: We discussed that we would recommend adjuvant endocrine therapy following completion of adjuvant radiation. In this patient, I will plan to treat with adjuvant AI plus ovarian suppression. Her labs show that she is premenopausal. I would consider the addition of abemaciclib per monarchE data given her proven nodal involvement and concerns for mediastinal involvement based on PET CT.    # Bone health  Will need DEXA prior to starting AI.    # Fertility  Patient is status post hysterectomy but has intact ovaries.     # Supportive care  > Constipation: Largely manageable, but a bit worse with C3 AC. Taking Colace BID. Encouraged her to add Miralax daily +/- Senna at bedtime if needed and titrate to effect.   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 Her chest wall pain is slowly improving. Has gabapentin and oxycodone. Could try OTC analgesics (Tylenol/ibuprofen) as well.  Also discussed trial of OTC SalonPas lidocaine patch to intact skin along (L) chest wall to see if this helps with pain. Dr. Juliene Pina aware and following. She suggested adding flexeril 5mg  PRN for muscle spasms if gabapentin is insufficient. Only requiring gabapentin PRN now; pain improving.   > Insomnia: Counseled patient of behavioral modifications including stopping screen-time 2 hours before sleeping, and minimizing day-time naps. Discouraged patient from taking Lorazepam as primary therapy as this is not standard of care.    > Psychosocial: Patient feels like she is coping generally well and denies any need for CCSP at this time. Has a good support system in Husband and children.  > FMLA: Additional FMLA paperwork given to her today by NN, Dan Maker, RN today     # Goals of Care:  - Discussion of an advanced directive: Not addressed  - Patient preferences: To start treatment promptly.  - Support systems: Husband  - Palliative care referral: No immediate indication    # Caregiver support:  The patient's husband is her primary caregiver / support person. At this time, she remains fully independent in ADLs.     # COVID-19 precautions  [name] has received the COVID-19 vaccine, 2nd dose 02/19/20. We also discussed the recommendation for immunocompromised patients to receive a booster, if they received the [employer] or Moderna vaccine. The booster should be given at least 28  days following completion of the primary series, and if possible, patient should receive booster from same manufacturer as initial doses. Deferred at this time but ammendable to receive booster in the next coming months. We advised that our ideal timeframe would be prior to November 2021.        DISPO:   -- RTC in 2 weeks for f/u with Dr. Rosalia Hammers prior to C1 Taxol (labs/MD visit scheduled; infusion not yet scheduled but scheduled orders previously signed for this). Encouraged pt to let us know if she does not see infusion scheduled by next week.    -- Inbasket message sent to [employer], CPP for Taxol counseling with pt sometime in next 2 weeks.       --------------------------    History:  [name] is a 54 y.o. woman with prediabetes who presents for evaluation of breast cancer.    -- due for C4 AC  -- got call from RRT this morning re: tachycardia and irregular pulse/heart rate.  -- notes that she's been having some palpitation sensations/fluttering at home for past 2-3 weeks off and on; worse with activities.   -- mild nausea; no vomiting.  Had a couple of days of feeling queazy, but well controlled with antiemetics.  Still taking olanzapine at bedtime post-chemo which she's found very helpful.   -- denies bony pains with Neulasta  -- notes that she had thrush at last visit, with little white spots but they have since resolved. No other mouth sores   -- some hyperpigmentation spots to tongue and palms of hands; nails have darkened (though are polished today).   -- denies cough  -- some DOE ; nothing at rest   -- struggled with constipation with this cycle; increased water intake and took Colace which helped.  Was talking Colace BID ; encouraged to add Miralax daily and could take 2 Senna at night also.   -- sleep is ok and improved   -- (L) CW pain is improved; now only taking gabapentin PRN   -- (L) CW area has not opened back up and is well healed now  -- has not required any supplemental O2 in past 2.5 weeks or so.  Did use it 1 day when she had increased DOE and had the heart rate issue, so she had some lightheadedness with this and used the O2 then. (this episode was ~2 weeks ago)  I don't get lightheaded until my heart does that funny thing. Notices that she sweats sometimes when my heart is doing the funny thing.    -- still some tenderness to (L) breast lumpectomy area at site of possible seroma   -- FMLA paperwork given to her today from [employer]    -- no headaches   -- appetite is ok; taste changes noticeable (everything tastes bland); eating small meals. Weight stable in past few weeks.    -- otherwise ROS mild/stable except as above     ECOG Performance Status: 1; symptomatic; remains largely independent.      I have reviewed her records including history, imaging, pathology reports, and, when applicable, operative notes and summarized her oncologic history below:    Oncology History Overview Note   2021: Left breast cT1c cN1, IDC, G3, weakly ER+ (5%), PR-, HER2-     Malignant neoplasm of upper-inner quadrant of left female breast (CMS-HCC)    - 03/29/2020 Presenting Symptoms    Self-palpated left breast mass      03/29/2020 Interval Scan(s)  MMG with tomo: asymmetric density UIQ of left breast. Additional oval, circumscribed mass in the lateral left breast, middle depth. Right breast, multiple round circumscribed masses scattered centrally.     [place]: Left breast: irregular, hypoechoic mass, 10:00, 1.7 x 1.7 x 1.5 cm. Additional oval, circumscribed anechoic mass, 2:00 5 CFN measuring 0.4 x 0.4 x 0.2 cm. Left axilla: 2 abnormal LN.    Right breast: Intermediate 5 mm mass at 1:00, 2 CFN.      04/05/2020 Biopsy    1. Left breast USG core biopsy at 10:00: IDC, g3, ER weakly+ (5%), PR-, HER2 negative (1+)  2. Left axillary USG core biopsy: positive for carcinoma  3. Right breast USG core biopsy in the UIQ: intraductal papilloma      04/05/2020 Initial Diagnosis    Malignant neoplasm of upper-inner quadrant of left female breast (CMS-HCC)     04/19/2020 -  Cancer Staged    Staging form: Breast, AJCC 8th Edition  - Clinical stage from 04/19/2020: Stage IIB (cT1c, cN1(f), cM0, G3, ER+, PR-, HER2-) - Signed by Talbert Cage, DO on 04/19/2020       04/20/2020 Tumor Board    MDC recs: 54 yo female with left breast cT1c N1 IDC, G3, weakly +(5%), PR negative, HER2 negative.  1. NACT, ddAC-T  2. Genetic testing recommending as patient has additional FH. Will order stat panel today.  3. Savi placement in axillary LN x2 for TAD outback.   4. Path to request block to repeat receptors and confirm ER status  5. Staging studies     05/26/2020 Genetics    Patient had genetic testing with breast cancer stat panel with add-on ATM and CHEK2 gene sent to [facility].  She had a negative result.     06/01/2020 Tumor Board    CT1cN3, biopsy confirms involvement of internal mammary node, but mediastinal node negative. Treat with curative intent therapy. Repeat receptors on node to determine if ER negative. If so, would add immunotherapy.    Internal mammary node: ER 40%, PR negative, HER2 negative  Proceed with NA ddAC-T plus carboplatin 06/09/2020 -  Chemotherapy    OP BREAST AC (DOSE DENSE) Q2W X 4 CYCLES, THEN PACLITAXEL WEEKLY / CARBOPLATIN EVERY 3 WEEKS FOR 12 WEEKS  DOXOrubicin 60 mg/m2 IV on day 1, cyclophosphamide 600 mg/m2 IV every 2 weeks for 4 cycles, THEN PACLitaxel 80 mg/m2 IV weekly, CARBOplatin AUC 6 IV every 3 weeks for 12 weeks       Gyn History: Status post hysterectomy, last menstrual period in 2016 prior to surgery, unsure if she has gone through menopause, no hot flashes or vaginal dryness      Social History     Social History Narrative    Married, 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      Husband had a stroke last year which affected his L side, he is R side dominant and relatively well recovered but not working.  He enjoys bowling in tournaments and pt will attend with him at times.      She also enjoys shopping (online and in person).    She has 6 children (3 sons, 3 daughters).       Physical Examination:  Vital Signs: BP 130/81  - Pulse 103  - Temp 36.8 ??C (98.2 ??F) (Tympanic)  - Resp 24  - Ht 162.6 cm (5' 4)  - Wt 99 kg (  218 lb 3.2 oz)  - SpO2 99%  - BMI 37.45 kg/m??   CONSTITUTIONAL: Pleasant, well-appearing woman in NAD  HEENT: Sclerae anicteric, conjunctiva clear; Moist mucous membranes and several benign hyperpigmentation spots to tongue (expected chemo changes).   NECK: Supple  HEME/LYMPH: No palpable cervical or supraclavicular lymphadenopathy  CV: Tachycardic, irregular rhythm; no LE edema.   RESP: LLL diminished, otherwise clear to auscultation bilat. Breathing non-labored.   GI: Soft, distended, now only mildly tender to palpation to epigastric region extending to (L) rib/diaphragm region.   SKIN AND SUBCUTANEOUS TISSUES:  (L) lateral chest wall with ~3 cm healed surgical incision; no drainage/redness.  (L) axillary surgical incision well-healed, but large palpable seroma; very tender.  (L) scapular region scar well-healed, also with nodularity/possible seroma; tender.  Palms of hands with benign hyperpigmentation spots (expected chemo changes).   BREAST: (L) breast with fullness and less discrete mass at 11:00,  ~12 cm from the nipple, now sub-cm in size (supine position) (pre-chemo 3 x 4 cm). (R) breast dense, but without palpable mass.  Bilat breast reduction scars well-healed.   NEURO: Alert and oriented, speech intact, following commands, moving all extremities well, no focal deficits appreciated  PSYCH: Normal mood and appropriate affect      Pertinent labs/imaging/pathology reviewed in detail.

## 2020-07-21 NOTE — Unmapped (Signed)
Port accessed.  Labs collected & sent for analysis.  To next appt.  Care provided by [facility] RN.

## 2020-07-22 NOTE — Unmapped (Signed)
1420 Patient arrived ambulatory in stable condition to infusion chair 29. Patient tolerated Adriamycin and Cytoxan without difficulty. Neulasta on pro applied to right arm and confirmed light flashing green and cartridge full. Patient discharged ambulatory in no distress at 1615.

## 2020-07-25 NOTE — Unmapped (Signed)
Pharmacy chemotherapy first cycle counseling    Patient Information: [name] is a 54 y.o. female with breast cancer who I have counseled prior to the initiation of paclitaxel and carboplatin.     Chemotherapy agents, administration, and schedule were reviewed with the patient.  Side effects discussed included but were not limited to: hypersensitivity reactions, nausea/vomiting, complications of myelosuppression, muscle aches, alopecia, peripheral neuropathy, mucositis, renal toxicity, skin/nail changes, and fatigue. I reviewed with the patient that she will not need pegfilgrastim with this regimen.  I reviewed that she should take ondansetron 8 mg BID on days 2-4 after carboplatin infusions.   She can use prochlorperazine for breakthrough nausea/vomiting.  We reviewed cryotherapy for prevention of peripheral neuropathy.    Patient verbalized understanding.  Approximate time spent on the phone with patient: 15 minutes

## 2020-07-26 DIAGNOSIS — C50212 Malignant neoplasm of upper-inner quadrant of left female breast: Principal | ICD-10-CM

## 2020-07-26 DIAGNOSIS — Z170 Estrogen receptor positive status [ER+]: Principal | ICD-10-CM

## 2020-07-26 MED ORDER — PROCHLORPERAZINE MALEATE 10 MG TABLET
ORAL_TABLET | 2 refills | 0 days | Status: CP
Start: 2020-07-26 — End: ?

## 2020-08-02 DIAGNOSIS — R000 Tachycardia, unspecified: Principal | ICD-10-CM

## 2020-08-02 NOTE — Unmapped (Unsigned)
Breast Oncology Clinic    PCP: Pearline Cables, MD    Multidisciplinary team:  Surgical Oncology: Sherilyn Cooter, MD  Radiation Oncology: Thom Chimes, MD / Konrad Penta, NP  Plastic Surgery: None at this time  Genetics: None at this time    Reason for Visit: management of breast cancer    Assessment/Plan:    [name] is a 54 y.o. woman with prediabetes and left breast cancer.    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 Journal of Clinical Oncology, January 2015.) in which carboplatin or bevacizumab was added to paclitaxel, significantly increasing rates of pCR (60% vs 44% for carboplatin). We did not add immunotherapy given ER positivity.    [name] has completed 4 cycles of AC and will now start:  - paclitaxel 80 mg/m2 IV weekly for 12 weeks   - carboplatin AUC 5 every 3 weeks for 4 doses (starting AUC 5)    Current therapy:   -- Due for C1 carboplatin and paclitaxel.  -- Labs reviewed and adequate for treatment. Very good clinical in-breast response thus far.   -- Discussed use of cryotherapy to reduce risk of CIPN    Symptomatic tachycardia with irregular rhythm  Patient with dyspnea, lightheadedness and heart palpitations. Had tachycardia with 6-min walk test, prompting EKG with sinus tachycardia with occasional PACs. We have ordered a Ziopatch to evaluate for cardiac arrhythmia, but this has not yet been initiated. It is possible that the patient has paroxysmal atrial fibrillation or flutter, precipitated by hypoxemia in the setting of phrenic nerve injury. It is also possible that this is just sinus tachycardia in a patient with low respiratory reserve and deconditioning. CT surgery previously following but has cancelled recent appointments. We will further evaluate and manage.     Chest wall seromas:   -- (L) sub-scapular area with healed incision, but there is fibrosis and seroma in this area that is tender (there is (L) axillary seroma at site of incision as well).   -- Dr. Juliene Pina evaluated the patient in-office 2 weeks ago.     Chemo-induced nausea and vomiting:  -- Continue Zofran/Dex post-chemo as directed.   -- Continue olanzapine 5 mg nightly on D1-4. Advised not to take at same time as oxycodone so as not to cause oversedation. Helpful; continue for final AC cycle. Likely will not need with Taxol course.   -- Continue Compazine prn    LLL pleural effusion/O2 requirement:   -- LLL diminished on exam. She is no longer using supplemental O2. Recent walking test showed she probably needs to continue O2 when up doing activities at home. Reinforced this again today.   -- O2 sat 99% on RA today in clinic.   -- CXR today 07/21/20 revealed now trace (L) pleural effusion.   -- PFT results are pending; will defer to Dr [facility] team for interpretation and management of results.    -- (L) lateral chest wall at site of previous chest tube is now well healed/wound closed.   -- Previous documentation noted that Dr. Juliene Pina was planning to f/u with pt today (9/16) after repeat PFTs/CXR, but this appt was canceled by his team.  Unclear if it needs to be  rescheduled??       Adjuvant therapy: We discussed that we would recommend adjuvant endocrine therapy following completion of adjuvant radiation. In this patient, I will plan to treat with adjuvant AI plus ovarian suppression. Her labs show that she is premenopausal. I would consider the addition of abemaciclib per monarchE data given her proven nodal involvement and concerns for mediastinal involvement based on PET CT.    # Bone health  Will need DEXA prior to starting AI.    # Fertility  Patient is status post hysterectomy but has intact ovaries.     # Supportive care  > Constipation: Largely manageable, but a bit worse with C3 AC. Taking Colace BID. Encouraged her to add Miralax daily +/- Senna at bedtime if needed and titrate to effect.   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 Her chest wall pain is slowly improving. Has gabapentin and oxycodone. Could try OTC analgesics (Tylenol/ibuprofen) as well.  Also discussed trial of OTC SalonPas lidocaine patch to intact skin along (L) chest wall to see if this helps with pain. Dr. Juliene Pina aware and following. She suggested adding flexeril 5mg  PRN for muscle spasms if gabapentin is insufficient. Only requiring gabapentin PRN now; pain improving.   > Insomnia: Counseled patient of behavioral modifications including stopping screen-time 2 hours before sleeping, and minimizing day-time naps. Discouraged patient from taking Lorazepam as primary therapy as this is not standard of care.    > Psychosocial: Patient feels like she is coping generally well and denies any need for CCSP at this time. Has a good support system in Husband and children.  > FMLA: Additional FMLA paperwork given to her today by NN, Dan Maker, RN today     # Goals of Care:  - Discussion of an advanced directive: Not addressed  - Patient preferences: To start treatment promptly.  - Support systems: Husband  - Palliative care referral: No immediate indication    # Caregiver support:  The patient's husband is her primary caregiver / support person. At this time, she remains fully independent in ADLs.     # COVID-19 precautions  [name] has received the COVID-19 vaccine, 2nd dose 02/19/20. We also discussed the recommendation for immunocompromised patients to receive a booster, if they received the [employer] or Moderna vaccine. The booster should be given at least 28 days following completion of the primary series, and if possible, patient should receive booster from same manufacturer as initial doses. Deferred at this time but ammendable to receive booster in the next coming months. We advised that our ideal timeframe would be prior to November 2021.        DISPO:   Future Appointments   Date Time Provider Department Center   08/04/2020  9:00 AM ADULT ONC LAB [facility]   08/04/2020 10:00 AM Gwinda Passe, MD [id number]   08/04/2020 11:00 AM ONCDEV BED 57 [id number]   08/11/2020 10:00 AM ADULT ONC LAB [facility]   08/11/2020 11:00 AM ONCDEV BED 57 [id number]   08/18/2020  9:00 AM ADULT ONC LAB [facility]   08/18/2020 10:00 AM ONCDEV CHAIR 56 [id number]   08/25/2020  8:30 AM ADULT ONC LAB [facility]   08/25/2020  9:30 AM Gwinda Passe, MD [id number]   08/25/2020 10:30 AM ONCINF CHAIR 07 [id number]   09/01/2020  9:30 AM ADULT ONC LAB  [facility]   09/01/2020 10:30 AM ONCINF CHAIR 07 [id number]   09/08/2020  9:30 AM ADULT ONC LAB [facility]   09/08/2020 10:30 AM ONCINF CHAIR 07 [id number]   09/15/2020  9:30 AM ADULT ONC LAB [facility]   09/15/2020 10:30 AM ONCINF CHAIR 07 [id number]   09/22/2020  9:00 AM ADULT ONC LAB [facility]   09/22/2020 10:00 AM ONCINF CHAIR 15 [facility]   09/28/2020  9:30 AM ADULT ONC LAB [facility]   09/28/2020 10:30 AM ONCINF CHAIR 20 [facility]   10/06/2020  9:30 AM ADULT ONC LAB [facility]   10/06/2020 10:30 AM ONCINF CHAIR 20 [facility]   10/13/2020  9:30 AM ADULT ONC LAB [facility]   10/13/2020 10:30 AM ONCINF CHAIR 27 [facility]   10/20/2020  9:30 AM ADULT ONC LAB [facility]   10/20/2020 10:30 AM ONCINF CHAIR 27 [facility]       --------------------------    History:  [name] is a 54 y.o. woman with prediabetes who presents for evaluation of breast cancer.    -- due for C4 AC  -- got call from RRT this morning re: tachycardia and irregular pulse/heart rate.  -- notes that she's been having some palpitation sensations/fluttering at home for past 2-3 weeks off and on; worse with activities.   -- mild nausea; no vomiting.  Had a couple of days of feeling queazy, but well controlled with antiemetics.  Still taking olanzapine at bedtime post-chemo which she's found very helpful.   -- denies bony pains with Neulasta  -- notes that she had thrush at last visit, with little white spots but they have since resolved. No other mouth sores   -- some hyperpigmentation spots to tongue and palms of hands; nails have darkened (though are polished today).   -- denies cough  -- some DOE ; nothing at rest   -- struggled with constipation with this cycle; increased water intake and took Colace which helped.  Was talking Colace BID ; encouraged to add Miralax daily and could take 2 Senna at night also.   -- sleep is ok and improved   -- (L) CW pain is improved; now only taking gabapentin PRN   -- (L) CW area has not opened back up and is well healed now  -- has not required any supplemental O2 in past 2.5 weeks or so.  Did use it 1 day when she had increased DOE and had the heart rate issue, so she had some lightheadedness with this and used the O2 then. (this episode was ~2 weeks ago)  I don't get lightheaded until my heart does that funny thing. Notices that she sweats sometimes when my heart is doing the funny thing.    -- still some tenderness to (L) breast lumpectomy area at site of possible seroma   -- FMLA paperwork given to her today from [employer]    -- no headaches   -- appetite is ok; taste changes noticeable (everything tastes bland); eating small meals. Weight stable in past few weeks.    -- otherwise ROS mild/stable except as above     ECOG Performance Status: 1; symptomatic; remains largely independent.      I have reviewed her records including history, imaging, pathology reports, and, when applicable, operative notes and summarized her oncologic history below:    Oncology History Overview  Note   2021: Left breast cT1c cN1, IDC, G3, weakly ER+ (5%), PR-, HER2-     Malignant neoplasm of upper-inner quadrant of left female breast (CMS-HCC)    - 03/29/2020 Presenting Symptoms    Self-palpated left breast mass      03/29/2020 Interval Scan(s)    MMG with tomo: asymmetric density UIQ of left breast. Additional oval, circumscribed mass in the lateral left breast, middle depth. Right breast, multiple round circumscribed masses scattered centrally.     [place]: Left breast: irregular, hypoechoic mass, 10:00, 1.7 x 1.7 x 1.5 cm. Additional oval, circumscribed anechoic mass, 2:00 5 CFN measuring 0.4 x 0.4 x 0.2 cm. Left axilla: 2 abnormal LN.    Right breast: Intermediate 5 mm mass at 1:00, 2 CFN.      04/05/2020 Biopsy    1. Left breast USG core biopsy at 10:00: IDC, g3, ER weakly+ (5%), PR-, HER2 negative (1+)  2. Left axillary USG core biopsy: positive for carcinoma  3. Right breast USG core biopsy in the UIQ: intraductal papilloma      04/05/2020 Initial Diagnosis    Malignant neoplasm of upper-inner quadrant of left female breast (CMS-HCC)     04/19/2020 -  Cancer Staged    Staging form: Breast, AJCC 8th Edition  - Clinical stage from 04/19/2020: Stage IIB (cT1c, cN1(f), cM0, G3, ER+, PR-, HER2-) - Signed by Talbert Cage, DO on 04/19/2020       04/20/2020 Tumor Board    MDC recs: 54 yo female with left breast cT1c N1 IDC, G3, weakly +(5%), PR negative, HER2 negative.  1. NACT, ddAC-T  2. Genetic testing recommending as patient has additional FH. Will order stat panel today.  3. Savi placement in axillary LN x2 for TAD outback.   4. Path to request block to repeat receptors and confirm ER status  5. Staging studies     05/26/2020 Genetics    Patient had genetic testing with breast cancer stat panel with add-on ATM and CHEK2 gene sent to [facility].  She had a negative result.     06/01/2020 Tumor Board CT1cN3, biopsy confirms involvement of internal mammary node, but mediastinal node negative. Treat with curative intent therapy. Repeat receptors on node to determine if ER negative. If so, would add immunotherapy.    Internal mammary node: ER 40%, PR negative, HER2 negative  Proceed with NA ddAC-T plus carboplatin     06/09/2020 -  Chemotherapy    OP BREAST AC (DOSE DENSE) Q2W X 4 CYCLES, THEN PACLITAXEL WEEKLY / CARBOPLATIN EVERY 3 WEEKS FOR 12 WEEKS  DOXOrubicin 60 mg/m2 IV on day 1, cyclophosphamide 600 mg/m2 IV every 2 weeks for 4 cycles, THEN PACLitaxel 80 mg/m2 IV weekly, CARBOplatin AUC 6 IV every 3 weeks for 12 weeks       Gyn History: Status post hysterectomy, last menstrual period in 2016 prior to surgery, unsure if she has gone through menopause, no hot flashes or vaginal dryness      Social History     Social History Narrative    Married, 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      Husband had a stroke last year which affected his L side, he is R side dominant and relatively well recovered but not working.  He enjoys bowling in tournaments and pt will attend with him at times.      She also enjoys shopping (online  and in person).    She has 6 children (3 sons, 3 daughters).       Physical Examination:  Vital Signs: There were no vitals taken for this visit.  CONSTITUTIONAL: Pleasant, well-appearing woman in NAD  HEENT: Sclerae anicteric, conjunctiva clear; Moist mucous membranes and several benign hyperpigmentation spots to tongue (expected chemo changes).   NECK: Supple  HEME/LYMPH: No palpable cervical or supraclavicular lymphadenopathy  CV: Tachycardic, irregular rhythm; no LE edema.   RESP: LLL diminished, otherwise clear to auscultation bilat. Breathing non-labored.   GI: Soft, distended, now only mildly tender to palpation to epigastric region extending to (L) rib/diaphragm region.   SKIN AND SUBCUTANEOUS TISSUES:  (L) lateral chest wall with ~3 cm healed surgical incision; no drainage/redness.  (L) axillary surgical incision well-healed, but large palpable seroma; very tender.  (L) scapular region scar well-healed, also with nodularity/possible seroma; tender.  Palms of hands with benign hyperpigmentation spots (expected chemo changes).   BREAST: (L) breast with fullness and less discrete mass at 11:00,  ~12 cm from the nipple, now sub-cm in size (supine position) (pre-chemo 3 x 4 cm). (R) breast dense, but without palpable mass.  Bilat breast reduction scars well-healed.   NEURO: Alert and oriented, speech intact, following commands, moving all extremities well, no focal deficits appreciated  PSYCH: Normal mood and appropriate affect      Pertinent labs/imaging/pathology reviewed in detail.

## 2020-08-03 NOTE — Unmapped (Signed)
This patient has been disenrolled from the [facility] specialty pharmacy services due to patient use neulasta onpro.    Frances Kennedy  [facility] Specialty Pharmacist

## 2020-08-04 ENCOUNTER — Other Ambulatory Visit: Admit: 2020-08-04 | Discharge: 2020-08-05 | Payer: PRIVATE HEALTH INSURANCE

## 2020-08-04 ENCOUNTER — Ambulatory Visit: Admit: 2020-08-04 | Discharge: 2020-08-05 | Payer: PRIVATE HEALTH INSURANCE

## 2020-08-04 ENCOUNTER — Ambulatory Visit
Admit: 2020-08-04 | Discharge: 2020-08-05 | Payer: PRIVATE HEALTH INSURANCE | Attending: Hematology & Oncology | Primary: Hematology & Oncology

## 2020-08-04 DIAGNOSIS — C50212 Malignant neoplasm of upper-inner quadrant of left female breast: Principal | ICD-10-CM

## 2020-08-04 DIAGNOSIS — Z170 Estrogen receptor positive status [ER+]: Principal | ICD-10-CM

## 2020-08-08 ENCOUNTER — Institutional Professional Consult (permissible substitution): Admit: 2020-08-08 | Discharge: 2020-08-09 | Payer: PRIVATE HEALTH INSURANCE

## 2020-08-08 DIAGNOSIS — R000 Tachycardia, unspecified: Principal | ICD-10-CM

## 2020-08-11 ENCOUNTER — Ambulatory Visit: Admit: 2020-08-11 | Discharge: 2020-08-12 | Payer: PRIVATE HEALTH INSURANCE

## 2020-08-11 ENCOUNTER — Other Ambulatory Visit: Admit: 2020-08-11 | Discharge: 2020-08-12 | Payer: PRIVATE HEALTH INSURANCE

## 2020-08-11 DIAGNOSIS — Z170 Estrogen receptor positive status [ER+]: Secondary | ICD-10-CM

## 2020-08-11 DIAGNOSIS — C50212 Malignant neoplasm of upper-inner quadrant of left female breast: Principal | ICD-10-CM

## 2020-08-18 ENCOUNTER — Other Ambulatory Visit: Admit: 2020-08-18 | Discharge: 2020-08-19 | Payer: PRIVATE HEALTH INSURANCE

## 2020-08-18 ENCOUNTER — Ambulatory Visit: Admit: 2020-08-18 | Discharge: 2020-08-19 | Payer: PRIVATE HEALTH INSURANCE

## 2020-08-18 DIAGNOSIS — C50212 Malignant neoplasm of upper-inner quadrant of left female breast: Principal | ICD-10-CM

## 2020-08-18 DIAGNOSIS — Z170 Estrogen receptor positive status [ER+]: Secondary | ICD-10-CM

## 2020-08-25 ENCOUNTER — Other Ambulatory Visit: Admit: 2020-08-25 | Discharge: 2020-08-25 | Payer: PRIVATE HEALTH INSURANCE

## 2020-08-25 ENCOUNTER — Ambulatory Visit
Admit: 2020-08-25 | Discharge: 2020-08-25 | Payer: PRIVATE HEALTH INSURANCE | Attending: Hematology & Oncology | Primary: Hematology & Oncology

## 2020-08-25 ENCOUNTER — Ambulatory Visit: Admit: 2020-08-25 | Discharge: 2020-08-25 | Payer: PRIVATE HEALTH INSURANCE

## 2020-08-25 DIAGNOSIS — Z170 Estrogen receptor positive status [ER+]: Principal | ICD-10-CM

## 2020-08-25 DIAGNOSIS — C50212 Malignant neoplasm of upper-inner quadrant of left female breast: Principal | ICD-10-CM

## 2020-08-29 DIAGNOSIS — I10 Essential (primary) hypertension: Principal | ICD-10-CM

## 2020-08-29 DIAGNOSIS — R000 Tachycardia, unspecified: Principal | ICD-10-CM

## 2020-08-29 DIAGNOSIS — T148XXA Other injury of unspecified body region, initial encounter: Principal | ICD-10-CM

## 2020-09-01 ENCOUNTER — Other Ambulatory Visit: Admit: 2020-09-01 | Discharge: 2020-09-02 | Payer: PRIVATE HEALTH INSURANCE

## 2020-09-01 ENCOUNTER — Ambulatory Visit: Admit: 2020-09-01 | Discharge: 2020-09-02 | Payer: PRIVATE HEALTH INSURANCE

## 2020-09-01 DIAGNOSIS — C50212 Malignant neoplasm of upper-inner quadrant of left female breast: Principal | ICD-10-CM

## 2020-09-01 DIAGNOSIS — Z170 Estrogen receptor positive status [ER+]: Secondary | ICD-10-CM

## 2020-09-08 ENCOUNTER — Ambulatory Visit: Admit: 2020-09-08 | Discharge: 2020-09-09 | Payer: PRIVATE HEALTH INSURANCE

## 2020-09-08 ENCOUNTER — Other Ambulatory Visit: Admit: 2020-09-08 | Discharge: 2020-09-09 | Payer: PRIVATE HEALTH INSURANCE

## 2020-09-08 DIAGNOSIS — Z170 Estrogen receptor positive status [ER+]: Principal | ICD-10-CM

## 2020-09-08 DIAGNOSIS — C50212 Malignant neoplasm of upper-inner quadrant of left female breast: Principal | ICD-10-CM

## 2020-09-08 DIAGNOSIS — Z5111 Encounter for antineoplastic chemotherapy: Principal | ICD-10-CM

## 2020-09-14 ENCOUNTER — Ambulatory Visit: Admit: 2020-09-14 | Discharge: 2020-09-15 | Payer: PRIVATE HEALTH INSURANCE

## 2020-09-14 ENCOUNTER — Other Ambulatory Visit: Admit: 2020-09-14 | Discharge: 2020-09-15 | Payer: PRIVATE HEALTH INSURANCE

## 2020-09-14 ENCOUNTER — Ambulatory Visit
Admit: 2020-09-14 | Discharge: 2020-09-15 | Payer: PRIVATE HEALTH INSURANCE | Attending: Hematology & Oncology | Primary: Hematology & Oncology

## 2020-09-14 DIAGNOSIS — Z90710 Acquired absence of both cervix and uterus: Principal | ICD-10-CM

## 2020-09-14 DIAGNOSIS — R202 Paresthesia of skin: Principal | ICD-10-CM

## 2020-09-14 DIAGNOSIS — R531 Weakness: Principal | ICD-10-CM

## 2020-09-14 DIAGNOSIS — R002 Palpitations: Principal | ICD-10-CM

## 2020-09-14 DIAGNOSIS — R000 Tachycardia, unspecified: Principal | ICD-10-CM

## 2020-09-14 DIAGNOSIS — R0789 Other chest pain: Principal | ICD-10-CM

## 2020-09-14 DIAGNOSIS — C50912 Malignant neoplasm of unspecified site of left female breast: Principal | ICD-10-CM

## 2020-09-14 DIAGNOSIS — C50212 Malignant neoplasm of upper-inner quadrant of left female breast: Principal | ICD-10-CM

## 2020-09-14 DIAGNOSIS — R200 Anesthesia of skin: Principal | ICD-10-CM

## 2020-09-14 DIAGNOSIS — R0600 Dyspnea, unspecified: Principal | ICD-10-CM

## 2020-09-14 DIAGNOSIS — Z170 Estrogen receptor positive status [ER+]: Principal | ICD-10-CM

## 2020-09-14 DIAGNOSIS — R7303 Prediabetes: Principal | ICD-10-CM

## 2020-09-14 DIAGNOSIS — Z5111 Encounter for antineoplastic chemotherapy: Principal | ICD-10-CM

## 2020-09-14 DIAGNOSIS — R42 Dizziness and giddiness: Principal | ICD-10-CM

## 2020-09-14 DIAGNOSIS — R5383 Other fatigue: Principal | ICD-10-CM

## 2020-09-21 DIAGNOSIS — G4733 Obstructive sleep apnea (adult) (pediatric): Principal | ICD-10-CM

## 2020-09-22 ENCOUNTER — Ambulatory Visit: Admit: 2020-09-22 | Discharge: 2020-09-22 | Payer: PRIVATE HEALTH INSURANCE

## 2020-09-22 ENCOUNTER — Other Ambulatory Visit: Admit: 2020-09-22 | Discharge: 2020-09-22 | Payer: PRIVATE HEALTH INSURANCE

## 2020-09-22 DIAGNOSIS — C50212 Malignant neoplasm of upper-inner quadrant of left female breast: Principal | ICD-10-CM

## 2020-09-22 DIAGNOSIS — Z170 Estrogen receptor positive status [ER+]: Principal | ICD-10-CM

## 2020-09-22 DIAGNOSIS — E876 Hypokalemia: Principal | ICD-10-CM

## 2020-09-22 DIAGNOSIS — Z5111 Encounter for antineoplastic chemotherapy: Principal | ICD-10-CM

## 2020-09-23 ENCOUNTER — Encounter (HOSPITAL_COMMUNITY): Payer: Self-pay | Admitting: *Deleted

## 2020-09-23 NOTE — Progress Notes (Signed)
Received referral from Dr. Tish Men for this pt to participate in pulmonary rehab with the the diagnosis of obstructive sleep apnea.  Clinical review of pt follow up appt on 11/10 with hematology/oncology office note along with infusion clinic visits. Pt is receiving weekly chemo infusion through December. Pt with Covid Risk Score - 4. Pt appropriate for scheduling for Pulmonary rehab to begin in January.  Will forward to support staff for scheduling and verification of insurance eligibility/benefits with pt consent. Cherre Huger, BSN Cardiac and Training and development officer

## 2020-09-28 ENCOUNTER — Encounter (HOSPITAL_COMMUNITY): Payer: Self-pay

## 2020-09-28 ENCOUNTER — Telehealth (HOSPITAL_COMMUNITY): Payer: Self-pay

## 2020-09-28 ENCOUNTER — Other Ambulatory Visit: Admit: 2020-09-28 | Discharge: 2020-09-29 | Payer: PRIVATE HEALTH INSURANCE

## 2020-09-28 ENCOUNTER — Ambulatory Visit: Admit: 2020-09-28 | Discharge: 2020-09-29 | Payer: PRIVATE HEALTH INSURANCE

## 2020-09-28 DIAGNOSIS — C50212 Malignant neoplasm of upper-inner quadrant of left female breast: Principal | ICD-10-CM

## 2020-09-28 DIAGNOSIS — Z170 Estrogen receptor positive status [ER+]: Principal | ICD-10-CM

## 2020-09-28 NOTE — Telephone Encounter (Signed)
Pt insurance is active and benefits verified through [place], DED $2,750/$2,750 met, out of pocket  $5,000/$4,883.76 met, co-insurance 25%. no pre-authorization required. Passport, [place], REF# [id number]

## 2020-10-05 ENCOUNTER — Other Ambulatory Visit: Admit: 2020-10-05 | Discharge: 2020-10-05 | Payer: PRIVATE HEALTH INSURANCE

## 2020-10-05 ENCOUNTER — Ambulatory Visit
Admit: 2020-10-05 | Discharge: 2020-10-05 | Payer: PRIVATE HEALTH INSURANCE | Attending: Hematology & Oncology | Primary: Hematology & Oncology

## 2020-10-05 ENCOUNTER — Ambulatory Visit: Admit: 2020-10-05 | Discharge: 2020-10-05 | Payer: PRIVATE HEALTH INSURANCE

## 2020-10-05 DIAGNOSIS — R0600 Dyspnea, unspecified: Principal | ICD-10-CM

## 2020-10-05 DIAGNOSIS — Z170 Estrogen receptor positive status [ER+]: Principal | ICD-10-CM

## 2020-10-05 DIAGNOSIS — C50212 Malignant neoplasm of upper-inner quadrant of left female breast: Principal | ICD-10-CM

## 2020-10-05 DIAGNOSIS — Z90710 Acquired absence of both cervix and uterus: Principal | ICD-10-CM

## 2020-10-05 DIAGNOSIS — R5383 Other fatigue: Principal | ICD-10-CM

## 2020-10-05 DIAGNOSIS — R42 Dizziness and giddiness: Principal | ICD-10-CM

## 2020-10-05 DIAGNOSIS — J9811 Atelectasis: Principal | ICD-10-CM

## 2020-10-05 DIAGNOSIS — R000 Tachycardia, unspecified: Principal | ICD-10-CM

## 2020-10-05 DIAGNOSIS — J90 Pleural effusion, not elsewhere classified: Principal | ICD-10-CM

## 2020-10-05 DIAGNOSIS — T451X5D Adverse effect of antineoplastic and immunosuppressive drugs, subsequent encounter: Principal | ICD-10-CM

## 2020-10-05 DIAGNOSIS — Z9981 Dependence on supplemental oxygen: Principal | ICD-10-CM

## 2020-10-05 DIAGNOSIS — R7303 Prediabetes: Principal | ICD-10-CM

## 2020-10-05 DIAGNOSIS — G629 Polyneuropathy, unspecified: Principal | ICD-10-CM

## 2020-10-05 DIAGNOSIS — R002 Palpitations: Principal | ICD-10-CM

## 2020-10-12 ENCOUNTER — Ambulatory Visit: Admit: 2020-10-12 | Discharge: 2020-10-13 | Payer: PRIVATE HEALTH INSURANCE

## 2020-10-12 ENCOUNTER — Other Ambulatory Visit: Admit: 2020-10-12 | Discharge: 2020-10-13 | Payer: PRIVATE HEALTH INSURANCE

## 2020-10-12 DIAGNOSIS — Z5111 Encounter for antineoplastic chemotherapy: Principal | ICD-10-CM

## 2020-10-12 DIAGNOSIS — C50212 Malignant neoplasm of upper-inner quadrant of left female breast: Principal | ICD-10-CM

## 2020-10-12 DIAGNOSIS — Z170 Estrogen receptor positive status [ER+]: Principal | ICD-10-CM

## 2020-10-19 ENCOUNTER — Other Ambulatory Visit: Admit: 2020-10-19 | Discharge: 2020-10-20 | Payer: PRIVATE HEALTH INSURANCE

## 2020-10-19 ENCOUNTER — Ambulatory Visit: Admit: 2020-10-19 | Discharge: 2020-10-20 | Payer: PRIVATE HEALTH INSURANCE

## 2020-10-19 DIAGNOSIS — E876 Hypokalemia: Principal | ICD-10-CM

## 2020-10-19 DIAGNOSIS — Z170 Estrogen receptor positive status [ER+]: Principal | ICD-10-CM

## 2020-10-19 DIAGNOSIS — C50212 Malignant neoplasm of upper-inner quadrant of left female breast: Principal | ICD-10-CM

## 2020-10-24 MED ORDER — GABAPENTIN 300 MG CAPSULE
ORAL_CAPSULE | 2 refills | 0 days | Status: CP
Start: 2020-10-24 — End: ?

## 2020-10-25 ENCOUNTER — Ambulatory Visit: Admit: 2020-10-25 | Discharge: 2020-10-26 | Payer: PRIVATE HEALTH INSURANCE

## 2020-10-25 DIAGNOSIS — C50212 Malignant neoplasm of upper-inner quadrant of left female breast: Principal | ICD-10-CM

## 2020-10-25 DIAGNOSIS — Z170 Estrogen receptor positive status [ER+]: Principal | ICD-10-CM

## 2020-11-02 ENCOUNTER — Ambulatory Visit: Admit: 2020-11-02 | Discharge: 2020-11-03 | Payer: PRIVATE HEALTH INSURANCE

## 2020-11-02 DIAGNOSIS — C50212 Malignant neoplasm of upper-inner quadrant of left female breast: Principal | ICD-10-CM

## 2020-11-02 DIAGNOSIS — Z170 Estrogen receptor positive status [ER+]: Principal | ICD-10-CM

## 2020-11-07 ENCOUNTER — Ambulatory Visit: Admit: 2020-11-07 | Discharge: 2020-11-08 | Payer: PRIVATE HEALTH INSURANCE

## 2020-11-07 DIAGNOSIS — C50212 Malignant neoplasm of upper-inner quadrant of left female breast: Principal | ICD-10-CM

## 2020-11-07 DIAGNOSIS — Z170 Estrogen receptor positive status [ER+]: Principal | ICD-10-CM

## 2020-11-10 ENCOUNTER — Encounter: Admit: 2020-11-10 | Discharge: 2020-11-10 | Payer: PRIVATE HEALTH INSURANCE

## 2020-11-10 ENCOUNTER — Ambulatory Visit: Admit: 2020-11-10 | Discharge: 2020-11-11 | Payer: PRIVATE HEALTH INSURANCE

## 2020-11-10 DIAGNOSIS — C50212 Malignant neoplasm of upper-inner quadrant of left female breast: Principal | ICD-10-CM

## 2020-11-10 DIAGNOSIS — Z170 Estrogen receptor positive status [ER+]: Principal | ICD-10-CM

## 2020-11-16 DIAGNOSIS — I10 Essential (primary) hypertension: Principal | ICD-10-CM

## 2020-11-16 DIAGNOSIS — Z170 Estrogen receptor positive status [ER+]: Principal | ICD-10-CM

## 2020-11-16 DIAGNOSIS — C50212 Malignant neoplasm of upper-inner quadrant of left female breast: Principal | ICD-10-CM

## 2020-11-16 DIAGNOSIS — Z79899 Other long term (current) drug therapy: Principal | ICD-10-CM

## 2020-11-16 DIAGNOSIS — E785 Hyperlipidemia, unspecified: Principal | ICD-10-CM

## 2020-11-16 DIAGNOSIS — C50912 Malignant neoplasm of unspecified site of left female breast: Principal | ICD-10-CM

## 2020-11-16 DIAGNOSIS — Z7984 Long term (current) use of oral hypoglycemic drugs: Principal | ICD-10-CM

## 2020-11-16 DIAGNOSIS — R531 Weakness: Principal | ICD-10-CM

## 2020-11-16 DIAGNOSIS — C773 Secondary and unspecified malignant neoplasm of axilla and upper limb lymph nodes: Principal | ICD-10-CM

## 2020-11-16 DIAGNOSIS — E119 Type 2 diabetes mellitus without complications: Principal | ICD-10-CM

## 2020-11-16 DIAGNOSIS — J9691 Respiratory failure, unspecified with hypoxia: Principal | ICD-10-CM

## 2020-11-30 ENCOUNTER — Ambulatory Visit
Admit: 2020-11-30 | Discharge: 2020-12-01 | Payer: PRIVATE HEALTH INSURANCE | Attending: Physician Assistant | Primary: Physician Assistant

## 2020-11-30 DIAGNOSIS — Z01812 Encounter for preprocedural laboratory examination: Principal | ICD-10-CM

## 2020-11-30 DIAGNOSIS — Z20822 Contact with and (suspected) exposure to covid-19: Principal | ICD-10-CM

## 2020-12-01 DIAGNOSIS — E785 Hyperlipidemia, unspecified: Principal | ICD-10-CM

## 2020-12-01 DIAGNOSIS — I10 Essential (primary) hypertension: Principal | ICD-10-CM

## 2020-12-01 DIAGNOSIS — Z7984 Long term (current) use of oral hypoglycemic drugs: Principal | ICD-10-CM

## 2020-12-01 DIAGNOSIS — Z79899 Other long term (current) drug therapy: Principal | ICD-10-CM

## 2020-12-01 DIAGNOSIS — R531 Weakness: Principal | ICD-10-CM

## 2020-12-01 DIAGNOSIS — C50912 Malignant neoplasm of unspecified site of left female breast: Principal | ICD-10-CM

## 2020-12-01 DIAGNOSIS — J9691 Respiratory failure, unspecified with hypoxia: Principal | ICD-10-CM

## 2020-12-01 DIAGNOSIS — E119 Type 2 diabetes mellitus without complications: Principal | ICD-10-CM

## 2020-12-01 DIAGNOSIS — C773 Secondary and unspecified malignant neoplasm of axilla and upper limb lymph nodes: Principal | ICD-10-CM

## 2020-12-01 DIAGNOSIS — Z170 Estrogen receptor positive status [ER+]: Principal | ICD-10-CM

## 2020-12-02 ENCOUNTER — Ambulatory Visit: Admit: 2020-12-02 | Discharge: 2020-12-03 | Payer: PRIVATE HEALTH INSURANCE

## 2020-12-02 ENCOUNTER — Encounter
Admit: 2020-12-02 | Discharge: 2020-12-03 | Payer: PRIVATE HEALTH INSURANCE | Attending: Certified Registered" | Primary: Certified Registered"

## 2020-12-02 DIAGNOSIS — C773 Secondary and unspecified malignant neoplasm of axilla and upper limb lymph nodes: Principal | ICD-10-CM

## 2020-12-02 DIAGNOSIS — E785 Hyperlipidemia, unspecified: Principal | ICD-10-CM

## 2020-12-02 DIAGNOSIS — R531 Weakness: Principal | ICD-10-CM

## 2020-12-02 DIAGNOSIS — Z79899 Other long term (current) drug therapy: Principal | ICD-10-CM

## 2020-12-02 DIAGNOSIS — J9691 Respiratory failure, unspecified with hypoxia: Principal | ICD-10-CM

## 2020-12-02 DIAGNOSIS — E119 Type 2 diabetes mellitus without complications: Principal | ICD-10-CM

## 2020-12-02 DIAGNOSIS — C50912 Malignant neoplasm of unspecified site of left female breast: Principal | ICD-10-CM

## 2020-12-02 DIAGNOSIS — Z170 Estrogen receptor positive status [ER+]: Principal | ICD-10-CM

## 2020-12-02 DIAGNOSIS — I10 Essential (primary) hypertension: Principal | ICD-10-CM

## 2020-12-02 DIAGNOSIS — Z7984 Long term (current) use of oral hypoglycemic drugs: Principal | ICD-10-CM

## 2020-12-02 MED ORDER — CEPHALEXIN 500 MG CAPSULE
ORAL_CAPSULE | Freq: Four times a day (QID) | ORAL | 0 refills | 7.00000 days
Start: 2020-12-02 — End: 2020-12-09

## 2020-12-03 MED ORDER — CEPHALEXIN 500 MG CAPSULE
ORAL_CAPSULE | Freq: Four times a day (QID) | ORAL | 0 refills | 7.00000 days | Status: CP
Start: 2020-12-03 — End: 2020-12-10
  Filled 2020-12-03: qty 28, 7d supply, fill #0

## 2020-12-03 MED ORDER — CYCLOBENZAPRINE 5 MG TABLET
ORAL_TABLET | Freq: Three times a day (TID) | ORAL | 0 refills | 14 days | Status: CP | PRN
Start: 2020-12-03 — End: 2020-12-17
  Filled 2020-12-03: qty 42, 14d supply, fill #0

## 2020-12-03 MED ORDER — OXYCODONE 5 MG TABLET
ORAL_TABLET | ORAL | 0 refills | 3.00000 days | Status: CP | PRN
Start: 2020-12-03 — End: 2020-12-03
  Filled 2020-12-03: qty 20, 4d supply, fill #0

## 2020-12-06 ENCOUNTER — Ambulatory Visit: Admit: 2020-12-06 | Discharge: 2021-01-02 | Payer: PRIVATE HEALTH INSURANCE

## 2020-12-06 ENCOUNTER — Ambulatory Visit
Admit: 2020-12-06 | Discharge: 2021-01-02 | Payer: PRIVATE HEALTH INSURANCE | Attending: Radiation Oncology | Primary: Radiation Oncology

## 2020-12-06 DIAGNOSIS — Z170 Estrogen receptor positive status [ER+]: Principal | ICD-10-CM

## 2020-12-06 DIAGNOSIS — C50212 Malignant neoplasm of upper-inner quadrant of left female breast: Principal | ICD-10-CM

## 2020-12-08 ENCOUNTER — Ambulatory Visit: Admit: 2020-12-08 | Discharge: 2020-12-09 | Payer: PRIVATE HEALTH INSURANCE

## 2020-12-08 ENCOUNTER — Ambulatory Visit
Admit: 2020-12-08 | Discharge: 2020-12-09 | Payer: PRIVATE HEALTH INSURANCE | Attending: Hematology & Oncology | Primary: Hematology & Oncology

## 2020-12-08 ENCOUNTER — Other Ambulatory Visit: Admit: 2020-12-08 | Discharge: 2020-12-09 | Payer: PRIVATE HEALTH INSURANCE

## 2020-12-08 DIAGNOSIS — Z9889 Other specified postprocedural states: Principal | ICD-10-CM

## 2020-12-08 DIAGNOSIS — C50212 Malignant neoplasm of upper-inner quadrant of left female breast: Principal | ICD-10-CM

## 2020-12-08 DIAGNOSIS — Z170 Estrogen receptor positive status [ER+]: Principal | ICD-10-CM

## 2020-12-08 DIAGNOSIS — Z09 Encounter for follow-up examination after completed treatment for conditions other than malignant neoplasm: Principal | ICD-10-CM

## 2020-12-08 MED ORDER — OXYCODONE 5 MG TABLET
ORAL_TABLET | ORAL | 0 refills | 2.00000 days | Status: CP | PRN
Start: 2020-12-08 — End: ?
  Filled 2020-12-08: qty 10, 2d supply, fill #0

## 2020-12-12 ENCOUNTER — Ambulatory Visit: Admit: 2020-12-12 | Discharge: 2020-12-13 | Payer: PRIVATE HEALTH INSURANCE

## 2020-12-12 DIAGNOSIS — Z9889 Other specified postprocedural states: Principal | ICD-10-CM

## 2020-12-19 ENCOUNTER — Ambulatory Visit: Admit: 2020-12-19 | Discharge: 2020-12-20 | Payer: PRIVATE HEALTH INSURANCE

## 2020-12-19 DIAGNOSIS — Z9889 Other specified postprocedural states: Principal | ICD-10-CM

## 2020-12-26 ENCOUNTER — Institutional Professional Consult (permissible substitution): Admit: 2020-12-26 | Discharge: 2020-12-27 | Payer: PRIVATE HEALTH INSURANCE

## 2020-12-26 ENCOUNTER — Ambulatory Visit: Admit: 2020-12-26 | Discharge: 2020-12-27 | Payer: PRIVATE HEALTH INSURANCE

## 2020-12-28 ENCOUNTER — Ambulatory Visit
Admit: 2020-12-28 | Discharge: 2021-01-26 | Payer: PRIVATE HEALTH INSURANCE | Attending: Rehabilitative and Restorative Service Providers" | Primary: Rehabilitative and Restorative Service Providers"

## 2020-12-30 ENCOUNTER — Ambulatory Visit: Admit: 2020-12-30 | Discharge: 2020-12-31 | Payer: PRIVATE HEALTH INSURANCE

## 2021-01-02 ENCOUNTER — Ambulatory Visit: Admit: 2021-01-02 | Discharge: 2021-01-03 | Payer: PRIVATE HEALTH INSURANCE

## 2021-01-02 DIAGNOSIS — C50012 Malignant neoplasm of nipple and areola, left female breast: Principal | ICD-10-CM

## 2021-01-03 ENCOUNTER — Ambulatory Visit
Admit: 2021-01-03 | Discharge: 2021-02-02 | Payer: PRIVATE HEALTH INSURANCE | Attending: Radiation Oncology | Primary: Radiation Oncology

## 2021-01-03 ENCOUNTER — Ambulatory Visit: Admit: 2021-01-03 | Discharge: 2021-02-02 | Payer: PRIVATE HEALTH INSURANCE

## 2021-01-05 ENCOUNTER — Ambulatory Visit: Admit: 2021-01-05 | Discharge: 2021-01-06 | Payer: PRIVATE HEALTH INSURANCE

## 2021-01-05 ENCOUNTER — Institutional Professional Consult (permissible substitution): Admit: 2021-01-05 | Discharge: 2021-01-06 | Payer: PRIVATE HEALTH INSURANCE

## 2021-01-05 DIAGNOSIS — N6489 Other specified disorders of breast: Principal | ICD-10-CM

## 2021-01-05 DIAGNOSIS — R928 Other abnormal and inconclusive findings on diagnostic imaging of breast: Principal | ICD-10-CM

## 2021-01-05 DIAGNOSIS — C50012 Malignant neoplasm of nipple and areola, left female breast: Principal | ICD-10-CM

## 2021-01-06 ENCOUNTER — Ambulatory Visit: Admit: 2021-01-06 | Discharge: 2021-01-07 | Payer: PRIVATE HEALTH INSURANCE

## 2021-01-06 DIAGNOSIS — R928 Other abnormal and inconclusive findings on diagnostic imaging of breast: Principal | ICD-10-CM

## 2021-01-09 ENCOUNTER — Ambulatory Visit: Admit: 2021-01-09 | Discharge: 2021-01-10 | Payer: PRIVATE HEALTH INSURANCE

## 2021-01-09 DIAGNOSIS — Z170 Estrogen receptor positive status [ER+]: Principal | ICD-10-CM

## 2021-01-09 DIAGNOSIS — C50212 Malignant neoplasm of upper-inner quadrant of left female breast: Principal | ICD-10-CM

## 2021-01-12 ENCOUNTER — Ambulatory Visit: Admit: 2021-01-12 | Discharge: 2021-01-13 | Payer: PRIVATE HEALTH INSURANCE

## 2021-01-12 DIAGNOSIS — Z170 Estrogen receptor positive status [ER+]: Principal | ICD-10-CM

## 2021-01-12 DIAGNOSIS — C50212 Malignant neoplasm of upper-inner quadrant of left female breast: Principal | ICD-10-CM

## 2021-01-14 ENCOUNTER — Encounter (HOSPITAL_BASED_OUTPATIENT_CLINIC_OR_DEPARTMENT_OTHER): Payer: Self-pay

## 2021-01-14 ENCOUNTER — Emergency Department (HOSPITAL_BASED_OUTPATIENT_CLINIC_OR_DEPARTMENT_OTHER): Payer: Commercial Managed Care - PPO

## 2021-01-14 ENCOUNTER — Observation Stay (HOSPITAL_BASED_OUTPATIENT_CLINIC_OR_DEPARTMENT_OTHER)
Admission: EM | Admit: 2021-01-14 | Discharge: 2021-01-16 | Disposition: A | Discharge status: Home / Self Care | Payer: Commercial Managed Care - PPO | Attending: Internal Medicine | Admitting: Internal Medicine

## 2021-01-14 ENCOUNTER — Other Ambulatory Visit: Payer: Self-pay

## 2021-01-14 DIAGNOSIS — G43909 Migraine, unspecified, not intractable, without status migrainosus: Secondary | ICD-10-CM | POA: Diagnosis present

## 2021-01-14 DIAGNOSIS — R4701 Aphasia: Secondary | ICD-10-CM

## 2021-01-14 DIAGNOSIS — Z853 Personal history of malignant neoplasm of breast: Secondary | ICD-10-CM

## 2021-01-14 DIAGNOSIS — G44209 Tension-type headache, unspecified, not intractable: Secondary | ICD-10-CM | POA: Diagnosis present

## 2021-01-14 DIAGNOSIS — T148XXA Other injury of unspecified body region, initial encounter: Secondary | ICD-10-CM | POA: Diagnosis present

## 2021-01-14 DIAGNOSIS — I10 Essential (primary) hypertension: Secondary | ICD-10-CM | POA: Diagnosis present

## 2021-01-14 DIAGNOSIS — C50912 Malignant neoplasm of unspecified site of left female breast: Secondary | ICD-10-CM

## 2021-01-14 DIAGNOSIS — E119 Type 2 diabetes mellitus without complications: Secondary | ICD-10-CM

## 2021-01-14 DIAGNOSIS — E785 Hyperlipidemia, unspecified: Secondary | ICD-10-CM

## 2021-01-14 DIAGNOSIS — I639 Cerebral infarction, unspecified: Secondary | ICD-10-CM | POA: Diagnosis present

## 2021-01-14 DIAGNOSIS — R29818 Other symptoms and signs involving the nervous system: Secondary | ICD-10-CM

## 2021-01-14 DIAGNOSIS — R42 Dizziness and giddiness: Secondary | ICD-10-CM

## 2021-01-14 DIAGNOSIS — Z20822 Contact with and (suspected) exposure to covid-19: Secondary | ICD-10-CM | POA: Diagnosis not present

## 2021-01-14 DIAGNOSIS — Z79899 Other long term (current) drug therapy: Secondary | ICD-10-CM | POA: Diagnosis not present

## 2021-01-14 DIAGNOSIS — G588 Other specified mononeuropathies: Secondary | ICD-10-CM | POA: Diagnosis not present

## 2021-01-14 DIAGNOSIS — G609 Hereditary and idiopathic neuropathy, unspecified: Secondary | ICD-10-CM | POA: Diagnosis not present

## 2021-01-14 DIAGNOSIS — F801 Expressive language disorder: Secondary | ICD-10-CM | POA: Diagnosis not present

## 2021-01-14 DIAGNOSIS — R519 Headache, unspecified: Secondary | ICD-10-CM | POA: Diagnosis present

## 2021-01-14 LAB — CBC
HCT: 34.6 % — ABNORMAL LOW (ref 36.0–46.0)
Hemoglobin: 11.1 g/dL — ABNORMAL LOW (ref 12.0–15.0)
MCH: 28.8 pg (ref 26.0–34.0)
MCHC: 32.1 g/dL (ref 30.0–36.0)
MCV: 89.6 fL (ref 80.0–100.0)
Platelets: 324 10*3/uL (ref 150–400)
RBC: 3.86 MIL/uL — ABNORMAL LOW (ref 3.87–5.11)
RDW: 14.8 % (ref 11.5–15.5)
WBC: 5.1 10*3/uL (ref 4.0–10.5)
nRBC: 0 % (ref 0.0–0.2)

## 2021-01-14 LAB — RESP PANEL BY RT-PCR (FLU A&B, COVID) ARPGX2
Influenza A by PCR: NEGATIVE
Influenza B by PCR: NEGATIVE
SARS Coronavirus 2 by RT PCR: NEGATIVE

## 2021-01-14 LAB — COMPREHENSIVE METABOLIC PANEL
ALT: 17 U/L (ref 0–44)
AST: 23 U/L (ref 15–41)
Albumin: 4 g/dL (ref 3.5–5.0)
Alkaline Phosphatase: 90 U/L (ref 38–126)
Anion gap: 13 (ref 5–15)
BUN: 13 mg/dL (ref 6–20)
CO2: 27 mmol/L (ref 22–32)
Calcium: 10 mg/dL (ref 8.9–10.3)
Chloride: 97 mmol/L — ABNORMAL LOW (ref 98–111)
Creatinine, Ser: 0.95 mg/dL (ref 0.44–1.00)
GFR, Estimated: >60 mL/min (ref >60)
Glucose, Bld: 128 mg/dL — ABNORMAL HIGH (ref 70–99)
Potassium: 3.8 mmol/L (ref 3.5–5.1)
Sodium: 137 mmol/L (ref 135–145)
Total Bilirubin: 0.4 mg/dL (ref 0.3–1.2)
Total Protein: 8.7 g/dL — ABNORMAL HIGH (ref 6.5–8.1)

## 2021-01-14 MED ORDER — SODIUM CHLORIDE 0.9 % IV BOLUS
1000.0000 mL | Freq: Once | INTRAVENOUS | Status: AC
  Administered 2021-01-14: 1000 mL via INTRAVENOUS

## 2021-01-14 MED ORDER — PROCHLORPERAZINE EDISYLATE 10 MG/2ML IJ SOLN
10.0000 mg | Freq: Once | INTRAMUSCULAR | Status: AC
  Administered 2021-01-14: 10 mg via INTRAVENOUS
  Filled 2021-01-14: qty 2

## 2021-01-14 MED ORDER — IOHEXOL 350 MG/ML SOLN
100.0000 mL | Freq: Once | INTRAVENOUS | Status: AC | PRN
  Administered 2021-01-14: 100 mL via INTRAVENOUS

## 2021-01-14 MED ORDER — ATORVASTATIN CALCIUM 80 MG PO TABS
80.0000 mg | ORAL_TABLET | Freq: Every day | ORAL | Status: DC
  Administered 2021-01-14 – 2021-01-16 (×2): 80 mg via ORAL
  Filled 2021-01-14 (×2): qty 1
  Filled 2021-01-14: qty 2

## 2021-01-14 MED ORDER — ASPIRIN 325 MG PO TABS
325.0000 mg | ORAL_TABLET | Freq: Every day | ORAL | Status: DC
  Administered 2021-01-14 – 2021-01-15 (×2): 325 mg via ORAL
  Filled 2021-01-14 (×2): qty 1

## 2021-01-14 MED ORDER — DIPHENHYDRAMINE HCL 50 MG/ML IJ SOLN
12.5000 mg | Freq: Once | INTRAMUSCULAR | Status: AC
  Administered 2021-01-14: 12.5 mg via INTRAVENOUS
  Filled 2021-01-14: qty 1

## 2021-01-14 NOTE — ED Provider Notes (Incomplete)
[facility] EMERGENCY DEPARTMENT Provider Note   CSN: [id number] Arrival date & time: 01/14/21  1607     History Chief Complaint  Patient presents with  . Headache    Lori Green is a 55 y.o. female with history of migraine and stage II breast cancer his last chemo treatment was December 2021.  Patient presents to the ED with her son reporting 2 weeks of frontal/bilateral temporal headache.  She also reports left leg weakness/stumbling and dizziness with walking that started 3 days ago (3/9).  She reports she came into the ED today because she started developing difficulty getting her words out.  She has tried Tylenol/ibuprofen for her headache but has not improved.  She had some nausea last night and today, but no vomiting.    History reviewed. No pertinent past medical history.  Patient Active Problem List   Diagnosis Date Noted  . Acute non intractable tension-type headache 01/14/2021  . Aphasia 01/14/2021  . Neurological deficit present 01/14/2021  . Dizziness 01/14/2021    History reviewed. No pertinent surgical history.   OB History   No obstetric history on file.     History reviewed. No pertinent family history.  Social History   Tobacco Use  . Smoking status: Never Smoker  . Smokeless tobacco: Never Used  Vaping Use  . Vaping Use: Never used  Substance Use Topics  . Alcohol use: Never  . Drug use: Never    Home Medications Prior to Admission medications   Medication Sig Start Date End Date Taking? Authorizing Provider  gabapentin (NEURONTIN) 300 MG capsule Take 300 mg by mouth 3 (three) times daily.   Yes [provider]    Allergies    Patient has no allergy information on record.  Review of Systems   Review of Systems  Constitutional: Positive for activity change and fatigue.  HENT: Negative.   Eyes: Negative.  Negative for visual disturbance.  Respiratory: Negative for chest tightness and shortness of breath.    Cardiovascular: Negative for chest pain.  Gastrointestinal: Negative.   Genitourinary: Negative.   Musculoskeletal: Negative.   Neurological: Positive for dizziness, speech difficulty, light-headedness, numbness and headaches. Negative for tremors, syncope, facial asymmetry and weakness.    Physical Exam Updated Vital Signs BP 132/79   Pulse (!) 108   Temp 98.3 F (36.8 C) (Oral)   Resp 20   Ht 5\' 4"  (1.626 m)   Wt 93 kg   SpO2 98%   BMI 35.19 kg/m   Physical Exam Constitutional:      General: She is not in acute distress.    Appearance: She is well-developed. She is not ill-appearing.  HENT:     Head: Normocephalic and atraumatic.     Mouth/Throat:     Mouth: Mucous membranes are moist.  Eyes:     Extraocular Movements: Extraocular movements intact.     Pupils: Pupils are equal, round, and reactive to light.  Cardiovascular:     Rate and Rhythm: Regular rhythm. Tachycardia present.     Heart sounds: Normal heart sounds.     Comments: HR 110 Pulmonary:     Effort: Pulmonary effort is normal. No respiratory distress.     Breath sounds: Normal breath sounds.  Abdominal:     General: Bowel sounds are normal.     Palpations: Abdomen is soft.  Musculoskeletal:        General: Normal range of motion.     Cervical back: Normal range of motion and  neck supple.  Skin:    General: Skin is warm and dry.     Capillary Refill: Capillary refill takes less than 2 seconds.  Neurological:     Mental Status: She is alert and oriented to person, place, and time. Mental status is at baseline.     GCS: GCS eye subscore is 4. GCS verbal subscore is 5. GCS motor subscore is 6.     Cranial Nerves: Cranial nerve deficit (decreased sensation to Right side of face when palpating bilaterally) present.     Sensory: No sensory deficit.     Motor: Weakness (4/5 to L Upper and Lower extremity) present.     Coordination: Coordination normal.  Psychiatric:        Mood and Affect: Mood normal.     ED Results / Procedures / Treatments   Labs (all labs ordered are listed, but only abnormal results are displayed) Labs Reviewed  SARS CORONAVIRUS 2 (TAT 6-24 HRS)  COMPREHENSIVE METABOLIC PANEL  CBC    EKG None  Radiology No results found.  Procedures Procedures - none  Medications Ordered in ED Medications  prochlorperazine (COMPAZINE) injection 10 mg (has no administration in time range)  diphenhydrAMINE (BENADRYL) injection 12.5 mg (has no administration in time range)  sodium chloride 0.9 % bolus 1,000 mL (has no administration in time range)    ED Course  I have reviewed the triage vital signs and the nursing notes.  Pertinent labs & imaging results that were available during my care of the patient were reviewed by me and considered in my medical decision making (see chart for details).  Neurological deficit, concerning for stroke: Patient with 2-week history of headache, 3 days ago started having left-sided lower extremity weakness, dizziness, presented to ED on the day that she developed speech difficulty.  Neurology was consulted in the ED, recommended MR brain with/without.  Unfortunately due to patient's history of breast cancer and tissue expanders placed in her chest patient is not a candidate for MRI imaging. Reached back out to Neuro, CTA Head/neck and CT Head venogram ordered instead.  Patient given low-dose Benadryl, Compazine, and 1 L normal saline.  Covid test performed in anticipation of patient needing admission is negative, she is asymptomatic.  CBC and CMP ordered.  CBC showing hemoglobin 11.1, CMP grossly normal. CTA Head/neck with abnormal hypoattenuation of posterior left parietal lobe likely a subacute infarction.  CT venogram with gyriform enhancement of left parietal temporal.  EKG shows sinus rhythm at 104 bpm.  Patient given aspirin 325, Lipitor 80 mg.      MDM Rules/Calculators/A&P                          Final Clinical Impression(s) / ED  Diagnoses Final diagnoses:  Acute non intractable tension-type headache  Aphasia  Neurological deficit present  Dizziness    Rx / DC Orders ED Discharge Orders    None

## 2021-01-14 NOTE — ED Notes (Signed)
Stroke scale negative Per EMS

## 2021-01-14 NOTE — ED Triage Notes (Signed)
Pt is a stage 2 breast cancer pt who arrives today with c/o intermittent HA x 2 weeks unresolved with tylenol/ibuprofen.  Pt's last chemo tx was 10/2020.  Also c/o nausea without vomiting today and last night.  Denies CP/SOB

## 2021-01-14 NOTE — Progress Notes (Addendum)
Phone Consultation re: further course of action  Multiple days of multiple focal neurological deficits, most recent three days of speech issues, somewhat chronic headache. CT with subacute left parietal hypodensity. Needs MRI w+w/o with d/d stroke v mass givn h/o malignancy (breast). Prudent to admit to hospitalist, get MRI and inpatient neurology consultation. Not within any window for intervention or tPA from a stroke perspective.  Plan d/w Dr Gustavus Messing at [facility] and communicated oncoming neurohospitalist.  Neurology will assess pt on arrival to [facility].  -- Amie Portland, MD Neurologist [facility] Pager: [phone]

## 2021-01-14 NOTE — ED Notes (Signed)
Per EMS, pt is experiencing some expressive aphasia "but can eventually get her words out".  Negative stroke scale per EMS.  Upon arrival pt does have some trouble finding her words, and a witnessed mild stutter with a few words.

## 2021-01-14 NOTE — ED Provider Notes (Signed)
[facility] EMERGENCY DEPARTMENT Provider Note   CSN: [id number] Arrival date & time: 01/14/21  1607     History Chief Complaint  Patient presents with  . Headache    Lori Green is a 55 y.o. female with history of migraine and stage II breast cancer his last chemo treatment was December 2021.  Patient presents to the ED with her son reporting 2 weeks of frontal/bilateral temporal headache.  She also reports left leg weakness/stumbling and dizziness with walking that started 3 days ago (3/9).  She reports she came into the ED today because she started developing difficulty getting her words out.  She has tried Tylenol/ibuprofen for her headache but has not improved.  She had some nausea last night and today, but no vomiting.    History reviewed. No pertinent past medical history.  Patient Active Problem List   Diagnosis Date Noted  . Acute non intractable tension-type headache 01/14/2021  . Aphasia 01/14/2021  . Neurological deficit present 01/14/2021  . Dizziness 01/14/2021    History reviewed. No pertinent surgical history.   OB History   No obstetric history on file.     History reviewed. No pertinent family history.  Social History   Tobacco Use  . Smoking status: Never Smoker  . Smokeless tobacco: Never Used  Vaping Use  . Vaping Use: Never used  Substance Use Topics  . Alcohol use: Never  . Drug use: Never    Home Medications Prior to Admission medications   Medication Sig Start Date End Date Taking? Authorizing Provider  gabapentin (NEURONTIN) 300 MG capsule Take 300 mg by mouth 3 (three) times daily.   Yes [provider]    Allergies    Patient has no allergy information on record.  Review of Systems   Review of Systems  Constitutional: Positive for activity change and fatigue.  HENT: Negative.   Eyes: Negative.  Negative for visual disturbance.  Respiratory: Negative for chest tightness and shortness of breath.    Cardiovascular: Negative for chest pain.  Gastrointestinal: Negative.   Genitourinary: Negative.   Musculoskeletal: Negative.   Neurological: Positive for dizziness, speech difficulty, light-headedness, numbness and headaches. Negative for tremors, syncope, facial asymmetry and weakness.    Physical Exam Updated Vital Signs BP 132/79   Pulse (!) 108   Temp 98.3 F (36.8 C) (Oral)   Resp 20   Ht 5\' 4"  (1.626 m)   Wt 93 kg   SpO2 98%   BMI 35.19 kg/m   Physical Exam Constitutional:      General: She is not in acute distress.    Appearance: She is well-developed. She is not ill-appearing.  HENT:     Head: Normocephalic and atraumatic.     Mouth/Throat:     Mouth: Mucous membranes are moist.  Eyes:     Extraocular Movements: Extraocular movements intact.     Pupils: Pupils are equal, round, and reactive to light.  Cardiovascular:     Rate and Rhythm: Regular rhythm. Tachycardia present.     Heart sounds: Normal heart sounds.     Comments: HR 110 Pulmonary:     Effort: Pulmonary effort is normal. No respiratory distress.     Breath sounds: Normal breath sounds.  Abdominal:     General: Bowel sounds are normal.     Palpations: Abdomen is soft.  Musculoskeletal:        General: Normal range of motion.     Cervical back: Normal range of motion and  neck supple.  Skin:    General: Skin is warm and dry.     Capillary Refill: Capillary refill takes less than 2 seconds.  Neurological:     Mental Status: She is alert and oriented to person, place, and time. Mental status is at baseline.     GCS: GCS eye subscore is 4. GCS verbal subscore is 5. GCS motor subscore is 6.     Cranial Nerves: Cranial nerve deficit (decreased sensation to Right side of face when palpating bilaterally) present.     Sensory: No sensory deficit.     Motor: Weakness (4/5 to L Upper and Lower extremity) present.     Coordination: Coordination normal.  Psychiatric:        Mood and Affect: Mood normal.     ED Results / Procedures / Treatments   Labs (all labs ordered are listed, but only abnormal results are displayed) Labs Reviewed  SARS CORONAVIRUS 2 (TAT 6-24 HRS)  COMPREHENSIVE METABOLIC PANEL  CBC    EKG None  Radiology No results found.  Procedures Procedures - none  Medications Ordered in ED Medications  prochlorperazine (COMPAZINE) injection 10 mg (has no administration in time range)  diphenhydrAMINE (BENADRYL) injection 12.5 mg (has no administration in time range)  sodium chloride 0.9 % bolus 1,000 mL (has no administration in time range)    ED Course  I have reviewed the triage vital signs and the nursing notes.  Pertinent labs & imaging results that were available during my care of the patient were reviewed by me and considered in my medical decision making (see chart for details).  Neurological deficit, concerning for stroke: Patient with 2-week history of headache, 3 days ago started having left-sided lower extremity weakness, dizziness, presented to ED on the day that she developed speech difficulty.  Neurology was consulted in the ED, recommended MR brain with/without.  Unfortunately due to patient's history of breast cancer and tissue expanders placed in her chest patient is not a candidate for MRI imaging. Reached back out to Neuro, CTA Head/neck and CT Head venogram ordered instead.  Patient given low-dose Benadryl, Compazine, and 1 L normal saline.  Covid test performed in anticipation of patient needing admission is negative, she is asymptomatic.  CBC and CMP ordered.  CBC showing hemoglobin 11.1, CMP grossly normal. CTA Head/neck with abnormal hypoattenuation of posterior left parietal lobe likely a subacute infarction.  CT venogram with gyriform enhancement of left parietal temporal junction representing most likely subacute infarction, no venous sinus thrombosis appreciated.  EKG shows sinus rhythm at 104 bpm.  Patient given aspirin 325, Lipitor 80  mg. -Hospitalist paged for admission to accept patient to [facility] for further evaluation, treatment -neurology also consulted, appreciate recs -Patient's course and plan signed out to oncoming provider  2330: Patient stated her desire to go home instead of being admitted.  Patient has been out from work for several weeks on FMLA for her breast cancer chemotherapy treatment.  Patient's first day back to work is expected to be Monday, 01/16/2021.  Patient is concerned that if she does not go back that day she can lose her job.  I spoke with the patient and strongly recommended that she at least be admitted for 1 night for risk stratification and to establish care with neurology.  Patient remains at bedside or [place] awaiting transportation to [facility] at this time.    MDM Rules/Calculators/A&P  Final Clinical Impression(s) / ED Diagnoses Final diagnoses:  Acute non intractable tension-type headache  Aphasia  Neurological deficit present  Dizziness    Rx / DC Orders ED Discharge Orders    None       Daisy Floro, DO 01/15/21 0006    Tegeler, Gwenyth Allegra, MD 01/15/21 1655

## 2021-01-15 ENCOUNTER — Encounter (HOSPITAL_COMMUNITY): Payer: Self-pay | Admitting: Family Medicine

## 2021-01-15 ENCOUNTER — Observation Stay (HOSPITAL_BASED_OUTPATIENT_CLINIC_OR_DEPARTMENT_OTHER): Payer: Commercial Managed Care - PPO

## 2021-01-15 DIAGNOSIS — G43909 Migraine, unspecified, not intractable, without status migrainosus: Secondary | ICD-10-CM | POA: Diagnosis present

## 2021-01-15 DIAGNOSIS — T148XXA Other injury of unspecified body region, initial encounter: Secondary | ICD-10-CM | POA: Diagnosis present

## 2021-01-15 DIAGNOSIS — I10 Essential (primary) hypertension: Secondary | ICD-10-CM | POA: Diagnosis present

## 2021-01-15 DIAGNOSIS — Z8639 Personal history of other endocrine, nutritional and metabolic disease: Secondary | ICD-10-CM

## 2021-01-15 DIAGNOSIS — Z853 Personal history of malignant neoplasm of breast: Secondary | ICD-10-CM

## 2021-01-15 DIAGNOSIS — G4733 Obstructive sleep apnea (adult) (pediatric): Secondary | ICD-10-CM | POA: Insufficient documentation

## 2021-01-15 DIAGNOSIS — C50912 Malignant neoplasm of unspecified site of left female breast: Secondary | ICD-10-CM

## 2021-01-15 DIAGNOSIS — I6389 Other cerebral infarction: Secondary | ICD-10-CM | POA: Diagnosis not present

## 2021-01-15 DIAGNOSIS — R4701 Aphasia: Secondary | ICD-10-CM | POA: Diagnosis not present

## 2021-01-15 DIAGNOSIS — I6300 Cerebral infarction due to thrombosis of unspecified precerebral artery: Secondary | ICD-10-CM

## 2021-01-15 DIAGNOSIS — I639 Cerebral infarction, unspecified: Secondary | ICD-10-CM

## 2021-01-15 HISTORY — DX: Essential (primary) hypertension: I10

## 2021-01-15 HISTORY — DX: Obstructive sleep apnea (adult) (pediatric): G47.33

## 2021-01-15 HISTORY — DX: Other injury of unspecified body region, initial encounter: T14.8XXA

## 2021-01-15 HISTORY — DX: Personal history of other endocrine, nutritional and metabolic disease: Z86.39

## 2021-01-15 LAB — BASIC METABOLIC PANEL
Anion gap: 7 (ref 5–15)
BUN: 6 mg/dL (ref 6–20)
CO2: 29 mmol/L (ref 22–32)
Calcium: 9.9 mg/dL (ref 8.9–10.3)
Chloride: 101 mmol/L (ref 98–111)
Creatinine, Ser: 0.82 mg/dL (ref 0.44–1.00)
GFR, Estimated: >60 mL/min (ref >60)
Glucose, Bld: 109 mg/dL — ABNORMAL HIGH (ref 70–99)
Potassium: 3.9 mmol/L (ref 3.5–5.1)
Sodium: 137 mmol/L (ref 135–145)

## 2021-01-15 LAB — CBC
HCT: 31.7 % — ABNORMAL LOW (ref 36.0–46.0)
Hemoglobin: 10.5 g/dL — ABNORMAL LOW (ref 12.0–15.0)
MCH: 29.7 pg (ref 26.0–34.0)
MCHC: 33.1 g/dL (ref 30.0–36.0)
MCV: 89.5 fL (ref 80.0–100.0)
Platelets: 298 10*3/uL (ref 150–400)
RBC: 3.54 MIL/uL — ABNORMAL LOW (ref 3.87–5.11)
RDW: 14.8 % (ref 11.5–15.5)
WBC: 4.4 10*3/uL (ref 4.0–10.5)
nRBC: 0 % (ref 0.0–0.2)

## 2021-01-15 LAB — HEMOGLOBIN A1C
Hgb A1c MFr Bld: 6.6 % — ABNORMAL HIGH (ref 4.8–5.6)
Mean Plasma Glucose: 142.72 mg/dL

## 2021-01-15 LAB — LIPID PANEL
Cholesterol: 269 mg/dL — ABNORMAL HIGH (ref 0–200)
HDL: 46 mg/dL (ref >40)
LDL Cholesterol: 194 mg/dL — ABNORMAL HIGH (ref 0–99)
Total CHOL/HDL Ratio: 5.8 RATIO
Triglycerides: 143 mg/dL (ref <150)
VLDL: 29 mg/dL (ref 0–40)

## 2021-01-15 LAB — ECHOCARDIOGRAM COMPLETE
Height: 64 in
S' Lateral: 2.6 cm
Weight: 3269.86 oz

## 2021-01-15 LAB — HIV ANTIBODY (ROUTINE TESTING W REFLEX): HIV Screen 4th Generation wRfx: NONREACTIVE

## 2021-01-15 MED ORDER — STROKE: EARLY STAGES OF RECOVERY BOOK
Freq: Once | Status: AC
  Filled 2021-01-15: qty 1

## 2021-01-15 MED ORDER — ASPIRIN EC 81 MG PO TBEC
81.0000 mg | DELAYED_RELEASE_TABLET | Freq: Every day | ORAL | Status: DC
  Administered 2021-01-16: 81 mg via ORAL
  Filled 2021-01-15: qty 1

## 2021-01-15 MED ORDER — ACETAMINOPHEN 650 MG RE SUPP: 650.0000 mg | RECTAL | Status: DC | PRN

## 2021-01-15 MED ORDER — ACETAMINOPHEN 160 MG/5ML PO SOLN: 650.0000 mg | ORAL | Status: DC | PRN

## 2021-01-15 MED ORDER — PERFLUTREN LIPID MICROSPHERE
1.0000 mL | INTRAVENOUS | Status: AC | PRN
  Administered 2021-01-15: 5 mL via INTRAVENOUS
  Filled 2021-01-15: qty 10

## 2021-01-15 MED ORDER — ACETAMINOPHEN 325 MG PO TABS
650.0000 mg | ORAL_TABLET | ORAL | Status: DC | PRN
  Administered 2021-01-15: 650 mg via ORAL
  Filled 2021-01-15: qty 2

## 2021-01-15 MED ORDER — ENOXAPARIN SODIUM 40 MG/0.4ML ~~LOC~~ SOLN
40.0000 mg | SUBCUTANEOUS | Status: DC
  Administered 2021-01-15 – 2021-01-16 (×2): 40 mg via SUBCUTANEOUS
  Filled 2021-01-15 (×2): qty 0.4

## 2021-01-15 MED ORDER — CLOPIDOGREL BISULFATE 75 MG PO TABS
75.0000 mg | ORAL_TABLET | Freq: Every day | ORAL | Status: DC
  Administered 2021-01-15 – 2021-01-16 (×2): 75 mg via ORAL
  Filled 2021-01-15 (×2): qty 1

## 2021-01-15 MED ORDER — GABAPENTIN 300 MG PO CAPS
300.0000 mg | ORAL_CAPSULE | Freq: Three times a day (TID) | ORAL | Status: DC
  Administered 2021-01-15 – 2021-01-16 (×5): 300 mg via ORAL
  Filled 2021-01-15 (×5): qty 1

## 2021-01-15 MED ORDER — SODIUM CHLORIDE 0.9 % IV SOLN: INTRAVENOUS | Status: DC

## 2021-01-15 MED ORDER — HYDROCODONE-ACETAMINOPHEN 5-325 MG PO TABS
1.0000 | ORAL_TABLET | Freq: Four times a day (QID) | ORAL | Status: DC | PRN
  Administered 2021-01-15 – 2021-01-16 (×4): 2 via ORAL
  Filled 2021-01-15 (×4): qty 2

## 2021-01-15 MED ORDER — SENNOSIDES-DOCUSATE SODIUM 8.6-50 MG PO TABS: 1.0000 | ORAL_TABLET | Freq: Every evening | ORAL | Status: DC | PRN

## 2021-01-15 NOTE — Progress Notes (Signed)
STROKE TEAM PROGRESS NOTE   INTERVAL HISTORY I personally reviewed history of presenting illness with the patient, electronic medical records and imaging films in PACS.  Patient presented with 2-week history of headaches and some transient speech difficulties yesterday.  CT scan shows a left parietal hypodensity with some enhancement.  Patient cannot have an MRI since she has recent breast reconstructions surgery for cancer treatment.  CT angiogram shows no large vessel stenosis or occlusion.  LDL cholesterol is elevated 194 mg percent.  Hemoglobin A1c 6.6.  Echocardiogram is pending  Vitals:   01/15/21 0534 01/15/21 0745 01/15/21 0918 01/15/21 1207  BP: 136/79 (!) 137/93 (!) 155/95 109/67  Pulse: 94 98 91 97  Resp: 16 20 18 20   Temp: 97.6 F (36.4 C) 98.1 F (36.7 C) 97.7 F (36.5 C) 97.9 F (36.6 C)  TempSrc: Oral Oral Oral Oral  SpO2: 99% 97% 100% 97%  Weight:      Height:       CBC:  Recent Labs  Lab 01/14/21 1724 01/15/21 0350  WBC 5.1 4.4  HGB 11.1* 10.5*  HCT 34.6* 31.7*  MCV 89.6 89.5  PLT 324 025   Basic Metabolic Panel:  Recent Labs  Lab 01/14/21 1724 01/15/21 0350  NA 137 137  K 3.8 3.9  CL 97* 101  CO2 27 29  GLUCOSE 128* 109*  BUN 13 6  CREATININE 0.95 0.82  CALCIUM 10.0 9.9   Lipid Panel:  Recent Labs  Lab 01/15/21 0350  CHOL 269*  TRIG 143  HDL 46  CHOLHDL 5.8  VLDL 29  LDLCALC 194*   HgbA1c:  Recent Labs  Lab 01/15/21 0350  HGBA1C 6.6*   Urine Drug Screen: No results for input(s): LABOPIA, COCAINSCRNUR, LABBENZ, AMPHETMU, THCU, LABBARB in the last 168 hours.  Alcohol Level No results for input(s): ETH in the last 168 hours.  IMAGING past 24 hours CT Angio Head W or Wo Contrast IMPRESSION:  1. No intracranial arterial occlusion or high-grade stenosis.  2. 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  3. No hemorrhage.  4. No cervical carotid or vertebral artery stenosis.    CT  VENOGRAM HEAD  Result Date: 01/14/2021 IMPRESSION:  1. No dural venous sinus thrombosis.  2. Gyriform enhancement at the left parietal-temporal junction, most consistent with subacute infarct.    PHYSICAL EXAM Physical Exam  Pleasant mildly obese middle-aged African-American lady not in distress. HEENT-  [place]/AT    Lungs- Respirations unlabored Extremities- No edema  Neurological Examination Mental Status: Alert, fully oriented, thought content appropriate.  Pleasant and cooperative. Speech fluent without evidence of aphasia.  Able to follow all commands without difficulty. No dysarthria Cranial Nerves: II:   visual  fields intact with no extinction to DSS. PERRL.  III,IV, VI: No ptosis. EOMI. No nystagmus.   V,VII: Smile symmetric, facial temp sensation equal bilaterally VIII: Hearing intact to conversation IX,X: No hypophonia XI: Symmetric shoulder shrug XII: Midline tongue extension Motor: Right :  Upper extremity   5/5                 Left:     Upper extremity   5/5             Lower extremity   5/5                  Lower extremity   5/5 No pronator drift.  Sensory: Temp and light touch intact throughout, bilaterally. No  extinction to DSS Deep Tendon Reflexes: 2+ and symmetric biceps, brachioradialis and patellae Cerebellar: No ataxia with FNF bilaterally  Gait: No postural instability while sitting. Deferred gait.   ASSESSMENT/PLAN [name] is a 55 y.o. 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 CT head showed subacute left parietal hypodensity.    Subacute left parietal hypodensity  CTA head: abnormal hypoattenuation in the posterior left parietal lobe  CTA neck: No cervical carotid or vertebral artery stenosis  CT venogram: No dural venous sinus thrombosis, gyriform enhancement at the left parietal-temporal junction likely  subacute infarct  Unable to obtain MRI due to recent placement of tissue expanders for breast reconstruction  CT head w/wo contrast to eval for underlying malignant lesion given history of recent breast cancer  TEE in the am to evaluate cardiac source of embolism  TCD bubble study to eval cause of stroke  Bilateral lower extremity dopplars to eval for DVTs as a cause for stroke  2D Echo EF: 55-60%,   LDL 194  HgbA1c 6.6  VTE prophylaxis - lovenox Heart healthy diet  No antithrombotics prior to admission, now on aspirin 81 mg daily and clopidogrel 75 mg daily. DAPT with aspirin and plavix for 3 weeks then aspirin alone  Therapy recommendations:  No PT needs, OT pending  Disposition:  pending  Hypertension  Home meds:  none  Stable . Permissive hypertension (OK if < 220/120) but gradually normalize in 5-7 days . Long-term BP goal normotensive  Hyperlipidemia  Home meds:  none  LDL 194, goal < 70  Lipitor 80mg  daily  Continue statin at discharge  Diabetes type II Controlled  Home meds:  none  HgbA1c 6.6, goal < 7.0  CBGs  No results for input(s): GLUCAP in the last 72 hours.   SSI  Other Stroke Risk Factors  Obesity, Body mass index is 35.08 kg/m., BMI >/= 30 associated with increased stroke risk, recommend weight loss, diet and exercise as appropriate   Family hx stroke (father)  Migraines  Obstructive sleep apnea  Other Active Problems  Stage 2 breast cancer s/p mastectomy, chemotherapy  Hospital day # 0  I have personally obtained history,examined this patient, reviewed notes, independently viewed imaging studies, participated in medical decision making and plan of care.ROS completed by me personally and pertinent positives fully documented  I have made any additions or clarifications directly to the above note. Agree with note above.  She presented with 2-week history of headaches with some transient speech difficulties yesterday and CT scan  of the head shows a left parietal faintly enhancing lesion unclear as to metastasis versus subacute infarct.  Patient cannot have an MRI due to breast reconstruction surgery.  Recommend repeat CT scan of the head with and without contrast tomorrow morning.  Continue ongoing stroke work-up and aggressive risk factor modification.  Aspirin 81 and Plavix 75 mg daily for 3 weeks followed by aspirin alone.  Long discussion with patient and Dr. Maylene Roes and answered questions.  Greater than 50% time during the 35-minute visit was spent in counseling and coordination of care about her presentation and imaging findings and discussion about evaluation treatment plan and answering questions.  Antony Contras, MD Medical Director [facility] Pager: [phone] 01/15/2021 4:00 PM   To contact Stroke Continuity provider, please refer to [email or web address] After hours, contact General Neurology

## 2021-01-15 NOTE — Progress Notes (Signed)
°  PROGRESS NOTE  Patient admitted earlier this morning. See H&P.   Lori Green is a 55 y.o. female with medical history significant for migraines, breast cancer, left phrenic nerve injury, OSA, hypertension, and type 2 diabetes mellitus, now presenting to the emergency department for evaluation of headache and speech difficulty.  Patient reports a history of migraine but had not had one in a long time until 2 weeks ago when she began experiencing frequent headaches similar to what she used to experience.  Then, over the past 3 days she has had intermittent speech difficulty described as trouble getting the words out with some stuttering.  She denies any change in vision or hearing and denies any numbness or weakness.  She denies difficulty swallowing.  She has history of occasional self-limited palpitations and wore ambulatory heart monitor for 2 weeks last fall which reportedly demonstrated episodes of sinus tachycardia but no atrial fibrillation or flutter.  Patient reports that she used to take medications for hypertension and diabetes and used to wear CPAP but believes that her hypertension, diabetes, and OSA have resolved.  She suffered a left phrenic nerve injury during a biopsy and was requiring supplemental oxygen but has had improvement in no longer feels short of breath or require supplemental oxygen.  In the emergency department, patient could not complete MRI brain due to incompatible breast tissue expander.  She underwent CTA head and neck which was concerning for subacute ischemic CVA involving the posterior left parietal lobe.  On examination this morning, patient states that her expressive aphasia has completely resolved.  Feels back to her baseline at this point.  -Appreciate neurology, stroke team to follow -No follow-up from PT standpoint -Echocardiogram pending -Aspirin, Lipitor   Status is: Observation  The patient will require care spanning > 2 midnights and should be moved  to inpatient because: Ongoing diagnostic testing needed not appropriate for outpatient work up  Dispo: The patient is from: Home              Anticipated d/c is to: Home              Patient currently is not medically stable to d/c.   Difficult to place patient No       Dessa Phi, DO [facility] 01/15/2021, 10:14 AM  Available via Epic secure chat 7am-7pm After these hours, please refer to coverage provider listed on [email or web address]

## 2021-01-15 NOTE — Evaluation (Signed)
Speech Language Pathology Evaluation Patient Details Name: Lori Green MRN: [id number] DOB: Jan 01, 1966 Today's Date: 01/15/2021 Time: 9326-7124 SLP Time Calculation (min) (ACUTE ONLY): 15.85 min  Problem List:  Patient Active Problem List   Diagnosis Date Noted  . History of left breast cancer 01/15/2021  . Migraine 01/15/2021  . History of diabetes mellitus 01/15/2021  . Hypertension 01/15/2021  . OSA (obstructive sleep apnea) 01/15/2021  . 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 Malignant neoplasm of left female breast ([place])   . Acute non intractable tension-type headache 01/14/2021  . Expressive aphasia 01/14/2021  . Neurological deficit present 01/14/2021  . Dizziness 01/14/2021  . Stroke [facility]) 01/14/2021   Past Medical History:  Past Medical History:  Diagnosis Date  . History of diabetes mellitus 01/15/2021  . Hypertension 01/15/2021  . 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 OSA (obstructive sleep apnea) 01/15/2021   Past Surgical History:  Past Surgical History:  Procedure Laterality Date  . ABDOMINAL HYSTERECTOMY    . MASTECTOMY     HPI:  Pt is a 54 y.o. female with medical history significant for migraines, breast cancer, left phrenic nerve injury, OSA, hypertension, and type 2 diabetes mellitus. Pt presented to the ED for evaluation of headache and speech difficulty. CTA Head 3/12: Area of abnormal hypoattenuation in the posterior left parietal lobe, likely a subacute infarct.   Assessment / Plan / Recommendation Clinical Impression  Pt participated in speech/language. Pt reported that she was living independently prior to admission and was employed full-time as a Music therapist. Pt stated that she believes her language impairment has resolved and she denied any changes in speech or cognition. Pt's RN denied observance of any deficits in speech/language. Her speech and language skills are currently within normal limits and no overt cognitive-linguistic deficits were  noted during the evaluation. Further skilled SLP services are not clinically indicated at this time. Pt and nursing were educated regarding results and recommendations; both parties verbalized understanding as well as agreement with plan of care.    SLP Assessment  SLP Recommendation/Assessment: Patient does not need any further Speech Lanaguage Pathology Services SLP Visit Diagnosis: Aphasia (R47.01)    Follow Up Recommendations  None    Frequency and Duration           SLP Evaluation Cognition  Overall Cognitive Status: Within Functional Limits for tasks assessed Arousal/Alertness: Awake/alert Orientation Level: Oriented X4 Attention: Focused;Sustained Focused Attention: Appears intact Sustained Attention: Appears intact (Immediate: 3/3; delayed: 3/3) Memory: Appears intact Awareness: Appears intact Problem Solving: Appears intact Executive Function: Reasoning Reasoning: Appears intact       Comprehension  Auditory Comprehension Overall Auditory Comprehension: Appears within functional limits for tasks assessed Yes/No Questions: Within Functional Limits Basic Biographical Questions:  (5/5) Complex Questions:  (5/5) Commands: Within Functional Limits Two Step Basic Commands:  (4/4) Multistep Basic Commands:  (3/4) Conversation: Complex Visual Recognition/Discrimination Discrimination: Within Function Limits Reading Comprehension Reading Status: Within funtional limits    Expression Expression Primary Mode of Expression: Verbal Verbal Expression Overall Verbal Expression: Appears within functional limits for tasks assessed Initiation: No impairment Automatic Speech: Counting;Day of week;Month of year (WNL) Repetition: No impairment (5/5) Naming: No impairment Pragmatics: No impairment   Oral / Motor  Oral Motor/Sensory Function Overall Oral Motor/Sensory Function: Within functional limits Motor Speech Overall Motor Speech: Appears within functional limits for  tasks assessed Respiration: Within functional limits Phonation: Normal Resonance: Within functional limits Articulation: Within functional limitis Intelligibility: Intelligible Motor Planning: Witnin functional limits  Motor Speech Errors: Not applicable   Lori Green I. Hardin Negus, [place], [place] Office number [phone] Pager [place] 01/15/2021, 4:32 PM

## 2021-01-15 NOTE — Progress Notes (Signed)
  Echocardiogram 2D Echocardiogram has been performed.  Lori Green 01/15/2021, 9:19 AM

## 2021-01-15 NOTE — Consult Note (Signed)
NEURO HOSPITALIST CONSULT NOTE   Requestig physician: Dr. Myna Hidalgo  Reason for Consult: Headache and speech difficulty  History obtained from:  Patient and Chart    HPI:                                                                                                                                          Lori Green is an 55 y.o. female with a history of DM, HTN, OSA, migraines, stage 2 breast cancer s/p mastectomy and left phrenic nerve injurywho presented to the [facility] ED via EMS on Saturday afternoon with a 2 week history of intermittent bilateral frontal and temporal headache. She had also had LLE weakness resulting in stumbling as well as dizziness with walking starting on 3/9. Additionally, she complained of having nausea without vomiting since the night before. EMS reported some expressive aphasia with difficulty "getting her words out". On arrival to the ED, she continued to have some trouble finding her words, with a mild stutter also noted with a few words.   Imaging of her brain with CTA, CTV and CT at [facility] revealed a left parietotemporal junction region of abnormal enhancement on CTV as well as corresponding hypodensity on CT, concerning for a subacute ischemic infarction. No venous sinus thrombosis or LVO was seen on the vascular studies.   EKG showed NSR but mildly tachycardic at 104 bpm.   She was administered ASA and started on Lipitor.   Her last chemotherapy treatment was in December. She denies any vision loss, dysphagia, hearing loss or current limb weakness.   Past Medical History:  Diagnosis Date  . History of diabetes mellitus 01/15/2021  . Hypertension 01/15/2021  . 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 OSA (obstructive sleep apnea) 01/15/2021    Past Surgical History:  Procedure Laterality Date  . ABDOMINAL HYSTERECTOMY    . MASTECTOMY      Family History  Problem Relation Age of Onset  . Stroke Father   . Heart disease Father   . Diabetes  Son               Social History:  reports that she has never smoked. She has never used smokeless tobacco. She reports that she does not drink alcohol and does not use drugs.  Not on File  MEDICATIONS:  Prior to Admission:  Medications Prior to Admission  Medication Sig Dispense Refill Last Dose  . gabapentin (NEURONTIN) 300 MG capsule Take 300 mg by mouth 3 (three) times daily.      Scheduled: . aspirin  325 mg Oral Daily  . atorvastatin  80 mg Oral Daily  . enoxaparin (LOVENOX) injection  40 mg Subcutaneous Q24H  . gabapentin  300 mg Oral TID   Continuous: . sodium chloride 75 mL/hr at 01/15/21 0455     ROS:                                                                                                                                       As per HPI. Does not endorse any additional symptoms.     Blood pressure (!) 146/85, pulse 96, temperature 97.8 F (36.6 C), temperature source Oral, resp. rate 18, height 5\' 4"  (1.626 m), weight 92.7 kg, SpO2 98 %.   General Examination:                                                                                                       Physical Exam  HEENT-  [place]/AT    Lungs- Respirations unlabored Extremities- No edema  Neurological Examination Mental Status: Alert, fully oriented, thought content appropriate.  Pleasant and cooperative. Speech fluent without evidence of aphasia.  Able to follow all commands without difficulty. No dysarthria Cranial Nerves: II: Temporal visual intact with no extinction to DSS. PERRL.  III,IV, VI: No ptosis. EOMI. No nystagmus.   V,VII: Smile symmetric, facial temp sensation equal bilaterally VIII: Hearing intact to conversation IX,X: No hypophonia XI: Symmetric shoulder shrug XII: Midline tongue extension Motor: Right : Upper extremity   5/5    Left:     Upper extremity    5/5  Lower extremity   5/5     Lower extremity   5/5 No pronator drift.  Sensory: Temp and light touch intact throughout, bilaterally. No extinction to DSS Deep Tendon Reflexes: 2+ and symmetric biceps, brachioradialis and patellae Cerebellar: No ataxia with FNF bilaterally  Gait: No postural instability while sitting. Deferred gait.    Lab Results: Basic Metabolic Panel: Recent Labs  Lab 01/14/21 1724  NA 137  K 3.8  CL 97*  CO2 27  GLUCOSE 128*  BUN 13  CREATININE 0.95  CALCIUM 10.0    CBC: Recent Labs  Lab 01/14/21 1724  WBC 5.1  HGB 11.1*  HCT 34.6*  MCV 89.6  PLT 324  Cardiac Enzymes: No results for input(s): CKTOTAL, CKMB, CKMBINDEX, TROPONINI in the last 168 hours.  Lipid Panel: No results for input(s): CHOL, TRIG, HDL, CHOLHDL, VLDL, LDLCALC in the last 168 hours.  Imaging: CT Angio Head W or Wo Contrast  Result Date: 01/14/2021 CLINICAL DATA:  Headaches. History of breast carcinoma. Nausea and vomiting. EXAM: CT ANGIOGRAPHY HEAD AND NECK TECHNIQUE: Multidetector CT imaging of the head and neck was performed using the standard protocol during bolus administration of intravenous contrast. Multiplanar CT image reconstructions and MIPs were obtained to evaluate the vascular anatomy. Carotid stenosis measurements (when applicable) are obtained utilizing NASCET criteria, using the distal internal carotid diameter as the denominator. CONTRAST:  148mL OMNIPAQUE IOHEXOL 350 MG/ML SOLN COMPARISON:  None. FINDINGS: CT HEAD FINDINGS Brain: There is no mass, hemorrhage or extra-axial collection. The size and configuration of the ventricles and extra-axial CSF spaces are normal. There is an area of abnormal hypoattenuation in the posterior left parietal lobe. Brain parenchyma is otherwise normal. Skull: The visualized skull base, calvarium and extracranial soft tissues are normal. Sinuses/Orbits: No fluid levels or advanced mucosal thickening of the visualized paranasal  sinuses. No mastoid or middle ear effusion. The orbits are normal. CTA NECK FINDINGS SKELETON: There is no bony spinal canal stenosis. No lytic or blastic lesion. OTHER NECK: Normal pharynx, larynx and major salivary glands. No cervical lymphadenopathy. Unremarkable thyroid gland. Breast tissue expanders. UPPER CHEST: No pneumothorax or pleural effusion. No nodules or masses. AORTIC ARCH: There is no calcific atherosclerosis of the aortic arch. There is no aneurysm, dissection or hemodynamically significant stenosis of the visualized portion of the aorta. Normal variant aortic arch branching pattern with the brachiocephalic and left common carotid arteries sharing a common origin. The visualized proximal subclavian arteries are widely patent. RIGHT CAROTID SYSTEM: Normal without aneurysm, dissection or stenosis. LEFT CAROTID SYSTEM: Normal without aneurysm, dissection or stenosis. VERTEBRAL ARTERIES: Left dominant configuration. Both origins are clearly patent. There is no dissection, occlusion or flow-limiting stenosis to the skull base (V1-V3 segments). CTA HEAD FINDINGS POSTERIOR CIRCULATION: --Vertebral arteries: Normal V4 segments. --Inferior cerebellar arteries: Normal. --Basilar artery: Normal. --Superior cerebellar arteries: Normal. --Posterior cerebral arteries (PCA): Normal. ANTERIOR CIRCULATION: --Intracranial internal carotid arteries: Normal. --Anterior cerebral arteries (ACA): Normal. Both A1 segments are present. Patent anterior communicating artery (a-comm). --Middle cerebral arteries (MCA): Normal. ANATOMIC VARIANTS: None Review of the MIP images confirms the above findings. IMPRESSION: 1. No intracranial arterial occlusion or high-grade stenosis. 2. 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 MRI of the brain with and without contrast is recommended for further evaluation if the breast tissue expanders can be cleared for MRI compatibility. 3. No  hemorrhage. 4. No cervical carotid or vertebral artery stenosis. 5. CT venogram is reported separately. These results were called by telephone at the time of interpretation on 01/14/2021 at 7:09 pm to provider [facility] , who verbally acknowledged these results. Electronically Signed   By: Ulyses Jarred M.D.   On: 01/14/2021 19:10   CT Angio Neck W and/or Wo Contrast  Result Date: 01/14/2021 CLINICAL DATA:  Headaches. History of breast carcinoma. Nausea and vomiting. EXAM: CT ANGIOGRAPHY HEAD AND NECK TECHNIQUE: Multidetector CT imaging of the head and neck was performed using the standard protocol during bolus administration of intravenous contrast. Multiplanar CT image reconstructions and MIPs were obtained to evaluate the vascular anatomy. Carotid stenosis measurements (when applicable) are obtained utilizing NASCET criteria, using the distal internal carotid diameter as the  denominator. CONTRAST:  1106mL OMNIPAQUE IOHEXOL 350 MG/ML SOLN COMPARISON:  None. FINDINGS: CT HEAD FINDINGS Brain: There is no mass, hemorrhage or extra-axial collection. The size and configuration of the ventricles and extra-axial CSF spaces are normal. There is an area of abnormal hypoattenuation in the posterior left parietal lobe. Brain parenchyma is otherwise normal. Skull: The visualized skull base, calvarium and extracranial soft tissues are normal. Sinuses/Orbits: No fluid levels or advanced mucosal thickening of the visualized paranasal sinuses. No mastoid or middle ear effusion. The orbits are normal. CTA NECK FINDINGS SKELETON: There is no bony spinal canal stenosis. No lytic or blastic lesion. OTHER NECK: Normal pharynx, larynx and major salivary glands. No cervical lymphadenopathy. Unremarkable thyroid gland. Breast tissue expanders. UPPER CHEST: No pneumothorax or pleural effusion. No nodules or masses. AORTIC ARCH: There is no calcific atherosclerosis of the aortic arch. There is no aneurysm, dissection or  hemodynamically significant stenosis of the visualized portion of the aorta. Normal variant aortic arch branching pattern with the brachiocephalic and left common carotid arteries sharing a common origin. The visualized proximal subclavian arteries are widely patent. RIGHT CAROTID SYSTEM: Normal without aneurysm, dissection or stenosis. LEFT CAROTID SYSTEM: Normal without aneurysm, dissection or stenosis. VERTEBRAL ARTERIES: Left dominant configuration. Both origins are clearly patent. There is no dissection, occlusion or flow-limiting stenosis to the skull base (V1-V3 segments). CTA HEAD FINDINGS POSTERIOR CIRCULATION: --Vertebral arteries: Normal V4 segments. --Inferior cerebellar arteries: Normal. --Basilar artery: Normal. --Superior cerebellar arteries: Normal. --Posterior cerebral arteries (PCA): Normal. ANTERIOR CIRCULATION: --Intracranial internal carotid arteries: Normal. --Anterior cerebral arteries (ACA): Normal. Both A1 segments are present. Patent anterior communicating artery (a-comm). --Middle cerebral arteries (MCA): Normal. ANATOMIC VARIANTS: None Review of the MIP images confirms the above findings. IMPRESSION: 1. No intracranial arterial occlusion or high-grade stenosis. 2. 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 MRI of the brain with and without contrast is recommended for further evaluation if the breast tissue expanders can be cleared for MRI compatibility. 3. No hemorrhage. 4. No cervical carotid or vertebral artery stenosis. 5. CT venogram is reported separately. These results were called by telephone at the time of interpretation on 01/14/2021 at 7:09 pm to provider [facility] , who verbally acknowledged these results. Electronically Signed   By: Ulyses Jarred M.D.   On: 01/14/2021 19:10   CT VENOGRAM HEAD  Result Date: 01/14/2021 CLINICAL DATA:  Headache and speech difficulty. Multiple neurologic deficits. History of breast  carcinoma. EXAM: CT VENOGRAM HEAD TECHNIQUE: Venographic phase images of the head were obtained following the administration of IV contrast. Multiplanar reformats and maximum intensity projections were created. CONTRAST:  151mL OMNIPAQUE IOHEXOL 350 MG/ML SOLN COMPARISON:  None. FINDINGS: Superior sagittal sinus: Normal. Straight sinus: Normal. Inferior sagittal sinus, vein of Galen and internal cerebral veins: Normal. Transverse sinuses: Normal. Sigmoid sinuses: Normal. Visualized jugular veins: Normal. There is gyriform enhancement at the left parietal-temporal junction. IMPRESSION: 1. No dural venous sinus thrombosis. 2. Gyriform enhancement at the left parietal-temporal junction, most consistent with subacute infarct. Electronically Signed   By: Ulyses Jarred M.D.   On: 01/14/2021 19:15    Assessment: 55 year old female presenting with headache and speech difficulty.  1. Exam is nonfocal.  2. CT head; Area of abnormal hypoattenuation in the posterior left parietal lobe, likely a subacute infarct. 3. CTA head: No intracranial arterial occlusion or high-grade stenosis. 4. CTA neck: No cervical carotid or vertebral artery stenosis. 5. CT venogram of head: No dural  venous sinus thrombosis. There is gyriform enhancement at the left parietal-temporal junction, most consistent with subacute infarct per Radiology report. Images were personally reviewed by Neurology and the enhancement which was felt to be gyriform may also represent dilated cortical veins suggestive of possible underlying AVM, which would be a significant consideration given the patient's headache. Will need 4-vessel catheter-based angiogram to further evaluate.  6. The patient's breast-tissue expanders are not MRI compatible per Radiologist. This would be unusual for recently placed static hardware that is not in the brain. Would call MRI techs to verify.   Recommendations: 1. HgbA1c, fasting lipid panel 2. TTE 3. Cardiac telemetry 4.  May need loop monitor at discharge 5. PT consult, OT consult, Speech consult 6. Continue ASA and atorvastatin 7. BP management. Out of the permissive HTN time window 8. Risk factor modification 9. Frequent neuro checks 10. Call MRI techs to determine whether their published tables classify the patient's breast expanders as MRI compatible or not. If MRI compatible, then obtain MRI brain with and without contrast. If not MRI compatible, will need to consult Dr. Estanislado Pandy for a catheter-based 4-vessel angiogram to further assess the left temporal finding described above.    Electronically signed: Dr. Kerney Elbe 01/15/2021, 5:04 AM

## 2021-01-15 NOTE — Evaluation (Signed)
Physical Therapy Evaluation Patient Details Name: Lori Green MRN: [id number] DOB: May 02, 1966 Today's Date: 01/15/2021   History of Present Illness  55 yo female presents to ED on 3/12 with headaches x2 weeks, N/V, expressive aphasia. CTA Head/neck with abnormal hypoattenuation of posterior left parietal lobe likely a subacute infarction.  CT venogram also supports subacute L parietal infarct. PMH includes stage 2 breast cancer with last chemo 12/21, migraines, L phrenic nerve injury, OSA, HTN, DMII.  Clinical Impression   Pt presents with [facility] strength, balance, gait, and activity tolerance. Pt reports no visual changes after CVA, and no focal neurologic deficits appreciated on PT exam. Pt demonstrates no deficits on dynamic gait index outcome measure, scoring 24/24. PT reviewed s/s of CVA via BE FAST acronym, pt with no further questions for PT and pt reports she feels at baseline. All education completed and pt mobilizing well, PT to sign off.      Follow Up Recommendations No PT follow up    Equipment Recommendations  None recommended by PT    Recommendations for Other Services       Precautions / Restrictions Precautions Precautions: None Restrictions Weight Bearing Restrictions: No      Mobility  Bed Mobility Overal bed mobility: Independent             General bed mobility comments: no physical assist needed, WFL time    Transfers Overall transfer level: Modified independent               General transfer comment: Mod I for increased time to rise, no physical assist  Ambulation/Gait Ambulation/Gait assistance: Modified independent (Device/Increase time) Gait Distance (Feet): 260 Feet Assistive device: None Gait Pattern/deviations: WFL(Within Functional Limits);Step-through pattern Gait velocity: WFL   General Gait Details: mod I for slightly increased time, but otherwise WFL gait. Pt tolerates challenge well (see balance section)  Stairs Stairs:  Yes Stairs assistance: Modified independent (Device/Increase time) Stair Management: No rails;Alternating pattern;Forwards Number of Stairs: 2 General stair comments: [facility]  Wheelchair Mobility    Modified Rankin (Stroke Patients Only) Modified Rankin (Stroke Patients Only) Pre-Morbid Rankin Score: No symptoms Modified Rankin: No significant disability     Balance Overall balance assessment: Independent   Sitting balance-Leahy Scale: Normal       Standing balance-Leahy Scale: Normal                   Standardized Balance Assessment Standardized Balance Assessment : Dynamic Gait Index   Dynamic Gait Index Level Surface: Normal Change in Gait Speed: Normal Gait with Horizontal Head Turns: Normal Gait with Vertical Head Turns: Normal Gait and Pivot Turn: Normal Step Over Obstacle: Normal Step Around Obstacles: Normal Steps: Normal Total Score: 24       Pertinent Vitals/Pain Pain Assessment: No/denies pain    Home Living Family/patient expects to be discharged to:: Private residence Living Arrangements: Spouse/significant other Available Help at Discharge: Family;Available PRN/intermittently Type of Home: House (condo) Home Access: Stairs to enter Entrance Stairs-Rails: None Entrance Stairs-Number of Steps: 3 Home Layout: One level Home Equipment: None      Prior Function Level of Independence: Independent         Comments: 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, assists with pt financial planning     Hand Dominance   Dominant Hand: Right    Extremity/Trunk Assessment   Upper Extremity Assessment Upper Extremity Assessment: Defer to OT evaluation    Lower Extremity Assessment Lower Extremity Assessment: Overall WFL for tasks assessed  Cervical / Trunk Assessment Cervical / Trunk Assessment: Normal  Communication   Communication: No difficulties  Cognition Arousal/Alertness: Awake/alert Behavior During Therapy: WFL for tasks  assessed/performed Overall Cognitive Status: Within Functional Limits for tasks assessed                                        General Comments General comments (skin integrity, edema, etc.): PT reviewed "BE FAST" s/s of CVA    Exercises     Assessment/Plan    PT Assessment Patent does not need any further PT services  PT Problem List         PT Treatment Interventions      PT Goals (Current goals can be found in the Care Plan section)  Acute Rehab PT Goals Patient Stated Goal: go home, resume work PT Goal Formulation: With patient Time For Goal Achievement: 01/15/21 Potential to Achieve Goals: Good    Frequency     Barriers to discharge        Co-evaluation               AM-PAC PT "6 Clicks" Mobility  Outcome Measure Help needed turning from your back to your side while in a flat bed without using bedrails?: None Help needed moving from lying on your back to sitting on the side of a flat bed without using bedrails?: None Help needed moving to and from a bed to a chair (including a wheelchair)?: None Help needed standing up from a chair using your arms (e.g., wheelchair or bedside chair)?: None Help needed to walk in hospital room?: None Help needed climbing 3-5 steps with a railing? : None 6 Click Score: 24    End of Session   Activity Tolerance: Patient tolerated treatment well Patient left: in chair;with call bell/phone within reach Nurse Communication: Mobility status PT Visit Diagnosis: Other abnormalities of gait and mobility (R26.89);Other symptoms and signs involving the nervous system (R29.898)    Time: 9485-4627 PT Time Calculation (min) (ACUTE ONLY): 15 min   Charges:   PT Evaluation $PT Eval Low Complexity: 1 Low          [place], PT Acute Rehabilitation Services Pager [phone]  Office [phone]  [place] E Ruffin Pyo 01/15/2021, 10:07 AM

## 2021-01-15 NOTE — H&P (Signed)
History and Physical    Lori Green GYB:[id number] DOB: [birth year] DOA: 01/14/2021  PCP: Darreld Mclean, MD   Patient coming from: Home   Chief Complaint: Speech difficulty, headaches   HPI: Lori Green is a 55 y.o. female with medical history significant for migraines, breast cancer, left phrenic nerve injury, OSA, hypertension, and type 2 diabetes mellitus, now presenting to the emergency department for evaluation of headache and speech difficulty.  Patient reports a history of migraine but had not had one in a long time until 2 weeks ago when she began experiencing frequent headaches similar to what she used to experience.  Then, over the past 3 days she has had intermittent speech difficulty described as trouble getting the words out with some stuttering.  She denies any change in vision or hearing and denies any numbness or weakness.  She denies difficulty swallowing.  She has history of occasional self-limited palpitations and wore ambulatory heart monitor for 2 weeks last fall which reportedly demonstrated episodes of sinus tachycardia but no atrial fibrillation or flutter.  Patient reports that she used to take medications for hypertension and diabetes and used to wear CPAP but believes that her hypertension, diabetes, and OSA have resolved.  She suffered a left phrenic nerve injury during a biopsy and was requiring supplemental oxygen but has had improvement in no longer feels short of breath or require supplemental oxygen.  [facility] ED Course: Upon arrival to the ED, patient is found to be afebrile, saturating well on room air, and with stable blood pressure.  EKG features sinus tachycardia with rate 104.  Chemistry panel is unremarkable and CBC notable for a mild normocytic anemia.  COVID-19 PCR is negative.  CT head, CTA head and neck, and CT venogram were performed and suggestive of subacute infarction involving the posterior left parietal lobe without hemorrhage.   Patient was treated with a liter of saline, 325 mg aspirin, 80 mg Lipitor, Benadryl, and Compazine in the ED.  Neurology recommended admission to [facility] for further evaluation including MRI brain with and without contrast.  Review of Systems:  All other systems reviewed and apart from HPI, are negative.  Past Medical History:  Diagnosis Date  . History of diabetes mellitus 01/15/2021  . Hypertension 01/15/2021  . 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 OSA (obstructive sleep apnea) 01/15/2021    Past Surgical History:  Procedure Laterality Date  . ABDOMINAL HYSTERECTOMY    . MASTECTOMY      Social History:   reports that she has never smoked. She has never used smokeless tobacco. She reports that she does not drink alcohol and does not use drugs.  Not on File  Family History  Problem Relation Age of Onset  . Stroke Father   . Heart disease Father   . Diabetes Son      Prior to Admission medications   Medication Sig Start Date End Date Taking? Authorizing Provider  gabapentin (NEURONTIN) 300 MG capsule Take 300 mg by mouth 3 (three) times daily.   Yes [provider]    Physical Exam: Vitals:   01/14/21 2230 01/15/21 0055 01/15/21 0147 01/15/21 0149  BP: 140/83 139/89  (!) 145/86  Pulse: (!) 101 100  97  Resp:  18  18  Temp:    97.9 F (36.6 C)  TempSrc:    Oral  SpO2: 96% 98%  99%  Weight:   92.7 kg   Height:  Constitutional: NAD, calm  Eyes: PERTLA, lids and conjunctivae normal ENMT: Mucous membranes are moist. Posterior pharynx clear of any exudate or lesions.   Neck:  supple, no masses  Respiratory: clear to auscultation bilaterally, no wheezing, no crackles. No accessory muscle use.  Cardiovascular: S1 & S2 heard, regular rate and rhythm. No extremity edema.   Abdomen: No distension, no tenderness, soft. Bowel sounds active.  Musculoskeletal: no clubbing / cyanosis. No joint deformity upper and lower extremities.   Skin: no  significant rashes, lesions, ulcers. Warm, dry, well-perfused. Neurologic: CN 2-12 grossly intact. Sensation intact. Strength 5/5 in all 4 limbs.  Psychiatric: Alert and oriented to person, place, and situation. Very pleasant and cooperative.    Labs and Imaging on Admission: I have personally reviewed following labs and imaging studies  CBC: Recent Labs  Lab 01/14/21 1724  WBC 5.1  HGB 11.1*  HCT 34.6*  MCV 89.6  PLT 355   Basic Metabolic Panel: Recent Labs  Lab 01/14/21 1724  NA 137  K 3.8  CL 97*  CO2 27  GLUCOSE 128*  BUN 13  CREATININE 0.95  CALCIUM 10.0   GFR: Estimated Creatinine Clearance: 72.1 mL/min (by C-G formula based on SCr of 0.95 mg/dL). Liver Function Tests: Recent Labs  Lab 01/14/21 1724  AST 23  ALT 17  ALKPHOS 90  BILITOT 0.4  PROT 8.7*  ALBUMIN 4.0   No results for input(s): LIPASE, AMYLASE in the last 168 hours. No results for input(s): AMMONIA in the last 168 hours. Coagulation Profile: No results for input(s): INR, PROTIME in the last 168 hours. Cardiac Enzymes: No results for input(s): CKTOTAL, CKMB, CKMBINDEX, TROPONINI in the last 168 hours. BNP (last 3 results) No results for input(s): PROBNP in the last 8760 hours. HbA1C: No results for input(s): HGBA1C in the last 72 hours. CBG: No results for input(s): GLUCAP in the last 168 hours. Lipid Profile: No results for input(s): CHOL, HDL, LDLCALC, TRIG, CHOLHDL, LDLDIRECT in the last 72 hours. Thyroid Function Tests: No results for input(s): TSH, T4TOTAL, FREET4, T3FREE, THYROIDAB in the last 72 hours. Anemia Panel: No results for input(s): VITAMINB12, FOLATE, FERRITIN, TIBC, IRON, RETICCTPCT in the last 72 hours. Urine analysis: No results found for: COLORURINE, APPEARANCEUR, LABSPEC, PHURINE, GLUCOSEU, HGBUR, BILIRUBINUR, KETONESUR, PROTEINUR, UROBILINOGEN, NITRITE, LEUKOCYTESUR Sepsis Labs: @LABRCNTIP (procalcitonin:4,lacticidven:4) ) Recent Results (from the past 240  hour(s))  Resp Panel by RT-PCR (Flu A&B, Covid) Nasopharyngeal Swab     Status: None   Collection Time: 01/14/21 10:11 PM   Specimen: Nasopharyngeal Swab; Nasopharyngeal(NP) swabs in vial transport medium  Result Value Ref Range Status   SARS Coronavirus 2 by RT PCR NEGATIVE NEGATIVE Final    Comment: (NOTE) SARS-CoV-2 target nucleic acids are NOT DETECTED.  The SARS-CoV-2 RNA is generally detectable in upper respiratory specimens during the acute phase of infection. The lowest concentration of SARS-CoV-2 viral copies this assay can detect is 138 copies/mL. A negative result does not preclude SARS-Cov-2 infection and should not be used as the sole basis for treatment or other patient management decisions. A negative result may occur with  improper specimen collection/handling, submission of specimen other than nasopharyngeal swab, presence of viral mutation(s) within the areas targeted by this assay, and inadequate number of viral copies(<138 copies/mL). A negative result must be combined with clinical observations, patient history, and epidemiological information. The expected result is Negative.  Fact Sheet for Patients:  EntrepreneurPulse.com.au  Fact Sheet for Healthcare Providers:  IncredibleEmployment.be  This test is no t  yet approved or cleared by the [place] and  has been authorized for detection and/or diagnosis of SARS-CoV-2 by FDA under an Emergency Use Authorization (EUA). This EUA will remain  in effect (meaning this test can be used) for the duration of the COVID-19 declaration under Section 564(b)(1) of the Act, 21 U.S.C.section 360bbb-3(b)(1), unless the authorization is terminated  or revoked sooner.       Influenza A by PCR NEGATIVE NEGATIVE Final   Influenza B by PCR NEGATIVE NEGATIVE Final    Comment: (NOTE) The Xpert Xpress SARS-CoV-2/FLU/RSV plus assay is intended as an aid in the diagnosis of influenza from  Nasopharyngeal swab specimens and should not be used as a sole basis for treatment. Nasal washings and aspirates are unacceptable for Xpert Xpress SARS-CoV-2/FLU/RSV testing.  Fact Sheet for Patients: EntrepreneurPulse.com.au  Fact Sheet for Healthcare Providers: IncredibleEmployment.be  This test is not yet approved or cleared by the [place] FDA and has been authorized for detection and/or diagnosis of SARS-CoV-2 by FDA under an Emergency Use Authorization (EUA). This EUA will remain in effect (meaning this test can be used) for the duration of the COVID-19 declaration under Section 564(b)(1) of the Act, 21 U.S.C. section 360bbb-3(b)(1), unless the authorization is terminated or revoked.  Performed at [facility], [address] [place], [place] [address]      Radiological Exams on Admission: CT Angio Head W or Wo Contrast  Result Date: 01/14/2021 CLINICAL DATA:  Headaches. History of breast carcinoma. Nausea and vomiting. EXAM: CT ANGIOGRAPHY HEAD AND NECK TECHNIQUE: Multidetector CT imaging of the head and neck was performed using the standard protocol during bolus administration of intravenous contrast. Multiplanar CT image reconstructions and MIPs were obtained to evaluate the vascular anatomy. Carotid stenosis measurements (when applicable) are obtained utilizing NASCET criteria, using the distal internal carotid diameter as the denominator. CONTRAST:  161mL OMNIPAQUE IOHEXOL 350 MG/ML SOLN COMPARISON:  None. FINDINGS: CT HEAD FINDINGS Brain: There is no mass, hemorrhage or extra-axial collection. The size and configuration of the ventricles and extra-axial CSF spaces are normal. There is an area of abnormal hypoattenuation in the posterior left parietal lobe. Brain parenchyma is otherwise normal. Skull: The visualized skull base, calvarium and extracranial soft tissues are normal. Sinuses/Orbits: No fluid levels or advanced  mucosal thickening of the visualized paranasal sinuses. No mastoid or middle ear effusion. The orbits are normal. CTA NECK FINDINGS SKELETON: There is no bony spinal canal stenosis. No lytic or blastic lesion. OTHER NECK: Normal pharynx, larynx and major salivary glands. No cervical lymphadenopathy. Unremarkable thyroid gland. Breast tissue expanders. UPPER CHEST: No pneumothorax or pleural effusion. No nodules or masses. AORTIC ARCH: There is no calcific atherosclerosis of the aortic arch. There is no aneurysm, dissection or hemodynamically significant stenosis of the visualized portion of the aorta. Normal variant aortic arch branching pattern with the brachiocephalic and left common carotid arteries sharing a common origin. The visualized proximal subclavian arteries are widely patent. RIGHT CAROTID SYSTEM: Normal without aneurysm, dissection or stenosis. LEFT CAROTID SYSTEM: Normal without aneurysm, dissection or stenosis. VERTEBRAL ARTERIES: Left dominant configuration. Both origins are clearly patent. There is no dissection, occlusion or flow-limiting stenosis to the skull base (V1-V3 segments). CTA HEAD FINDINGS POSTERIOR CIRCULATION: --Vertebral arteries: Normal V4 segments. --Inferior cerebellar arteries: Normal. --Basilar artery: Normal. --Superior cerebellar arteries: Normal. --Posterior cerebral arteries (PCA): Normal. ANTERIOR CIRCULATION: --Intracranial internal carotid arteries: Normal. --Anterior cerebral arteries (ACA): Normal. Both A1 segments are present. Patent anterior communicating artery (  a-comm). --Middle cerebral arteries (MCA): Normal. ANATOMIC VARIANTS: None Review of the MIP images confirms the above findings. IMPRESSION: 1. No intracranial arterial occlusion or high-grade stenosis. 2. 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 MRI of the brain with and without contrast is recommended for further evaluation if the breast tissue  expanders can be cleared for MRI compatibility. 3. No hemorrhage. 4. No cervical carotid or vertebral artery stenosis. 5. CT venogram is reported separately. These results were called by telephone at the time of interpretation on 01/14/2021 at 7:09 pm to provider [facility] , who verbally acknowledged these results. Electronically Signed   By: Ulyses Jarred M.D.   On: 01/14/2021 19:10   CT Angio Neck W and/or Wo Contrast  Result Date: 01/14/2021 CLINICAL DATA:  Headaches. History of breast carcinoma. Nausea and vomiting. EXAM: CT ANGIOGRAPHY HEAD AND NECK TECHNIQUE: Multidetector CT imaging of the head and neck was performed using the standard protocol during bolus administration of intravenous contrast. Multiplanar CT image reconstructions and MIPs were obtained to evaluate the vascular anatomy. Carotid stenosis measurements (when applicable) are obtained utilizing NASCET criteria, using the distal internal carotid diameter as the denominator. CONTRAST:  196mL OMNIPAQUE IOHEXOL 350 MG/ML SOLN COMPARISON:  None. FINDINGS: CT HEAD FINDINGS Brain: There is no mass, hemorrhage or extra-axial collection. The size and configuration of the ventricles and extra-axial CSF spaces are normal. There is an area of abnormal hypoattenuation in the posterior left parietal lobe. Brain parenchyma is otherwise normal. Skull: The visualized skull base, calvarium and extracranial soft tissues are normal. Sinuses/Orbits: No fluid levels or advanced mucosal thickening of the visualized paranasal sinuses. No mastoid or middle ear effusion. The orbits are normal. CTA NECK FINDINGS SKELETON: There is no bony spinal canal stenosis. No lytic or blastic lesion. OTHER NECK: Normal pharynx, larynx and major salivary glands. No cervical lymphadenopathy. Unremarkable thyroid gland. Breast tissue expanders. UPPER CHEST: No pneumothorax or pleural effusion. No nodules or masses. AORTIC ARCH: There is no calcific atherosclerosis of the  aortic arch. There is no aneurysm, dissection or hemodynamically significant stenosis of the visualized portion of the aorta. Normal variant aortic arch branching pattern with the brachiocephalic and left common carotid arteries sharing a common origin. The visualized proximal subclavian arteries are widely patent. RIGHT CAROTID SYSTEM: Normal without aneurysm, dissection or stenosis. LEFT CAROTID SYSTEM: Normal without aneurysm, dissection or stenosis. VERTEBRAL ARTERIES: Left dominant configuration. Both origins are clearly patent. There is no dissection, occlusion or flow-limiting stenosis to the skull base (V1-V3 segments). CTA HEAD FINDINGS POSTERIOR CIRCULATION: --Vertebral arteries: Normal V4 segments. --Inferior cerebellar arteries: Normal. --Basilar artery: Normal. --Superior cerebellar arteries: Normal. --Posterior cerebral arteries (PCA): Normal. ANTERIOR CIRCULATION: --Intracranial internal carotid arteries: Normal. --Anterior cerebral arteries (ACA): Normal. Both A1 segments are present. Patent anterior communicating artery (a-comm). --Middle cerebral arteries (MCA): Normal. ANATOMIC VARIANTS: None Review of the MIP images confirms the above findings. IMPRESSION: 1. No intracranial arterial occlusion or high-grade stenosis. 2. 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 MRI of the brain with and without contrast is recommended for further evaluation if the breast tissue expanders can be cleared for MRI compatibility. 3. No hemorrhage. 4. No cervical carotid or vertebral artery stenosis. 5. CT venogram is reported separately. These results were called by telephone at the time of interpretation on 01/14/2021 at 7:09 pm to provider [facility] , who verbally acknowledged these results. Electronically Signed   By: Lennette Bihari  [place] M.D.   On: 01/14/2021 19:10   CT VENOGRAM HEAD  Result Date: 01/14/2021 CLINICAL DATA:  Headache and speech  difficulty. Multiple neurologic deficits. History of breast carcinoma. EXAM: CT VENOGRAM HEAD TECHNIQUE: Venographic phase images of the head were obtained following the administration of IV contrast. Multiplanar reformats and maximum intensity projections were created. CONTRAST:  117mL OMNIPAQUE IOHEXOL 350 MG/ML SOLN COMPARISON:  None. FINDINGS: Superior sagittal sinus: Normal. Straight sinus: Normal. Inferior sagittal sinus, vein of Galen and internal cerebral veins: Normal. Transverse sinuses: Normal. Sigmoid sinuses: Normal. Visualized jugular veins: Normal. There is gyriform enhancement at the left parietal-temporal junction. IMPRESSION: 1. No dural venous sinus thrombosis. 2. Gyriform enhancement at the left parietal-temporal junction, most consistent with subacute infarct. Electronically Signed   By: Ulyses Jarred M.D.   On: 01/14/2021 19:15    EKG: Independently reviewed. Sinus tachycardia, rate 104.   Assessment/Plan   1. 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 History of HTN; history of DM  - Patient reports no longer requiring medications for HTN or DM  - Update A1c, monitor BP   4. Neuropathy   - Continue gabapentin     DVT prophylaxis: Lovenox  Code  Status: Full  Level of Care: Level of care: Telemetry Medical Family Communication: None present  Disposition Plan:  Patient is from: Home  Anticipated d/c is to: Home  Anticipated d/c date is: 01/16/21 Patient currently: Pending neurology consultation, echocardiogram, therapy assessments, A1c and lipid panel Consults called: Neurology   Admission status: Observation    Vianne Bulls, MD [facility]  01/15/2021, 4:39 AM

## 2021-01-15 NOTE — Progress Notes (Signed)
OT Cancellation Note  Patient Details Name: Lori Green MRN: [id number] DOB: 03/07/1966   Cancelled Treatment:    Reason Eval/Treat Not Completed: OT screened, no needs identified, will sign off (Per pt she is independent with ADL and mobility. No further OT skilled services required.)   Jefferey Pica, OTR/L [place] Pager: [phone] Office: [place] 01/15/2021, 2:02 PM

## 2021-01-15 NOTE — Progress Notes (Signed)
OT Cancellation Note  Patient Details Name: BLAKELYN DINGES MRN: [id number] DOB: 05-07-1966   Cancelled Treatment:    Reason Eval/Treat Not Completed: Patient at procedure or test/ unavailable (Pt going for echo at this time. OT to continue to follow for OT eval.)   Jefferey Pica, OTR/L [facility] Pager: [phone] Office: [phone]    Kady Toothaker C 01/15/2021, 8:41 AM

## 2021-01-16 ENCOUNTER — Observation Stay (HOSPITAL_COMMUNITY): Payer: Commercial Managed Care - PPO

## 2021-01-16 ENCOUNTER — Observation Stay (HOSPITAL_BASED_OUTPATIENT_CLINIC_OR_DEPARTMENT_OTHER): Payer: Commercial Managed Care - PPO

## 2021-01-16 DIAGNOSIS — E785 Hyperlipidemia, unspecified: Secondary | ICD-10-CM

## 2021-01-16 DIAGNOSIS — E119 Type 2 diabetes mellitus without complications: Secondary | ICD-10-CM

## 2021-01-16 DIAGNOSIS — Z170 Estrogen receptor positive status [ER+]: Principal | ICD-10-CM

## 2021-01-16 DIAGNOSIS — C50212 Malignant neoplasm of upper-inner quadrant of left female breast: Principal | ICD-10-CM

## 2021-01-16 DIAGNOSIS — I639 Cerebral infarction, unspecified: Secondary | ICD-10-CM | POA: Diagnosis not present

## 2021-01-16 DIAGNOSIS — R4701 Aphasia: Secondary | ICD-10-CM | POA: Diagnosis not present

## 2021-01-16 LAB — BASIC METABOLIC PANEL
Anion gap: 6 (ref 5–15)
BUN: 9 mg/dL (ref 6–20)
CO2: 27 mmol/L (ref 22–32)
Calcium: 9.4 mg/dL (ref 8.9–10.3)
Chloride: 101 mmol/L (ref 98–111)
Creatinine, Ser: 0.89 mg/dL (ref 0.44–1.00)
GFR, Estimated: >60 mL/min (ref >60)
Glucose, Bld: 112 mg/dL — ABNORMAL HIGH (ref 70–99)
Potassium: 3.9 mmol/L (ref 3.5–5.1)
Sodium: 134 mmol/L — ABNORMAL LOW (ref 135–145)

## 2021-01-16 LAB — CBC
HCT: 30.2 % — ABNORMAL LOW (ref 36.0–46.0)
Hemoglobin: 9.9 g/dL — ABNORMAL LOW (ref 12.0–15.0)
MCH: 29.2 pg (ref 26.0–34.0)
MCHC: 32.8 g/dL (ref 30.0–36.0)
MCV: 89.1 fL (ref 80.0–100.0)
Platelets: 277 10*3/uL (ref 150–400)
RBC: 3.39 MIL/uL — ABNORMAL LOW (ref 3.87–5.11)
RDW: 14.7 % (ref 11.5–15.5)
WBC: 3.4 10*3/uL — ABNORMAL LOW (ref 4.0–10.5)
nRBC: 0 % (ref 0.0–0.2)

## 2021-01-16 MED ORDER — IOHEXOL 300 MG/ML  SOLN
75.0000 mL | Freq: Once | INTRAMUSCULAR | Status: AC | PRN
  Administered 2021-01-16: 75 mL via INTRAVENOUS

## 2021-01-16 MED ORDER — ATORVASTATIN CALCIUM 80 MG PO TABS: 80.0000 mg | ORAL_TABLET | Freq: Every day | ORAL | 2 refills | Status: DC

## 2021-01-16 MED ORDER — ASPIRIN 81 MG PO TBEC: 81.0000 mg | DELAYED_RELEASE_TABLET | Freq: Every day | ORAL | 2 refills | Status: DC

## 2021-01-16 NOTE — Progress Notes (Signed)
STROKE TEAM PROGRESS NOTE   INTERVAL HISTORY Patient is sitting up in bed.  She is comfortable.  She has no complaints today.  CT scan of the head was repeated this morning with contrast shows abnormal enhancement in the left parietal region with leptomeningeal enhancement pattern more suggestive of  metastasis than subacute infarct. 2D echo showed ejection fraction of 55 to 60% without cardiac source of embolism. TCD bubble study was positive for right-to-left shunt. Lower extremity venous Dopplers were negative for DVT    Vitals:   01/15/21 1542 01/15/21 1945 01/15/21 2335 01/16/21 0337  BP: 130/81 132/90 127/71 132/87  Pulse: (!) 106 (!) 104 93 98  Resp: 18 18 18    Temp: 98.6 F (37 C) 98.2 F (36.8 C) 97.6 F (36.4 C) 97.7 F (36.5 C)  TempSrc: Oral Oral Oral Oral  SpO2: 100% 98% 100% 100%  Weight:      Height:       CBC:  Recent Labs  Lab 01/15/21 0350 01/16/21 0157  WBC 4.4 3.4*  HGB 10.5* 9.9*  HCT 31.7* 30.2*  MCV 89.5 89.1  PLT 298 867   Basic Metabolic Panel:  Recent Labs  Lab 01/15/21 0350 01/16/21 0157  NA 137 134*  K 3.9 3.9  CL 101 101  CO2 29 27  GLUCOSE 109* 112*  BUN 6 9  CREATININE 0.82 0.89  CALCIUM 9.9 9.4   Lipid Panel:  Recent Labs  Lab 01/15/21 0350  CHOL 269*  TRIG 143  HDL 46  CHOLHDL 5.8  VLDL 29  LDLCALC 194*   HgbA1c:  Recent Labs  Lab 01/15/21 0350  HGBA1C 6.6*   Urine Drug Screen: No results for input(s): LABOPIA, COCAINSCRNUR, LABBENZ, AMPHETMU, THCU, LABBARB in the last 168 hours.  Alcohol Level No results for input(s): ETH in the last 168 hours.  IMAGING past 24 hours CT Angio Head W or Wo Contrast IMPRESSION:  1. No intracranial arterial occlusion or high-grade stenosis.  2. 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  3. No hemorrhage.  4. No cervical carotid or vertebral artery stenosis.    CT VENOGRAM HEAD  Result Date: 01/14/2021 IMPRESSION:  1.  No dural venous sinus thrombosis.  2. Gyriform enhancement at the left parietal-temporal junction, most consistent with subacute infarct.    PHYSICAL EXAM Physical Exam  Pleasant mildly obese middle-aged African-American lady not in distress. HEENT-  [place]/AT    Lungs- Respirations unlabored Extremities- No edema  Neurological Examination Mental Status: Alert, fully oriented, thought content appropriate.  Pleasant and cooperative. Speech fluent without evidence of aphasia.  Able to follow all commands without difficulty. No dysarthria Cranial Nerves: II:   visual  fields intact with no extinction to DSS. PERRL.  III,IV, VI: No ptosis. EOMI. No nystagmus.   V,VII: Smile symmetric, facial temp sensation equal bilaterally VIII: Hearing intact to conversation IX,X: No hypophonia XI: Symmetric shoulder shrug XII: Midline tongue extension Motor: Right :  Upper extremity   5/5                 Left:     Upper extremity   5/5             Lower extremity   5/5                  Lower extremity   5/5 No pronator drift.  Sensory: Temp and light touch intact throughout, bilaterally. No extinction to DSS Deep Tendon  Reflexes: 2+ and symmetric biceps, brachioradialis and patellae Cerebellar: No ataxia with FNF bilaterally  Gait: No postural instability while sitting. Deferred gait.   ASSESSMENT/PLAN [name] is a 55 y.o. 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 CT head showed subacute left parietal hypodensity.    Subacute left parietal hypodensity  CTA head: abnormal hypoattenuation in the posterior left parietal lobe  CTA neck: No cervical carotid or vertebral artery stenosis  CT venogram: No dural venous sinus thrombosis, gyriform enhancement at the left parietal-temporal junction likely subacute infarct  Unable to obtain MRI due to recent  placement of tissue expanders for breast reconstruction  CT head w/wo: posterior left hemisphere hypodensity and enhancement pattern is more suspicious for Leptomeningeal Metastatic Disease than ischemia/infarct Two small indeterminate skull lesions  TEE in the am to evaluate cardiac source of embolism: Pending  TCD bubble study to eval cause of stroke: Positive for small right to left shunt bilateral lower extremity dopplars to eval for DVTs as a cause for stroke: Negative for DVT 2D Echo EF: 55-60%, No intracardiac source of embolism  detected  LDL 194  HgbA1c 6.6  VTE prophylaxis - lovenox Heart healthy diet  No antithrombotics prior to admission, now on aspirin 81 mg daily and clopidogrel 75 mg daily. DAPT with aspirin   alone  Therapy recommendations:  No PT needs, OT pending  Disposition:  pending  Hypertension  Home meds:  none  Stable  BP goal normotensive  Hyperlipidemia  Home meds:  none  LDL 194, goal < 70  Lipitor 80mg  daily  Continue statin at discharge  Diabetes type II Controlled  Home meds:  none  HgbA1c 6.6, goal < 7.0  CBGs  No results for input(s): GLUCAP in the last 72 hours.   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  Recommend follow-up with patient's oncologist to decide if spinal tap is necessary or PET scan of the brain.  May consider starting dexamethasone degree cytotoxic edema.  Continue aspirin alone.  No  further stroke work-up necessary. TCD bubble study suggest right left shunt with lower extremity venous Dopplers are negative for DVT hence anticoagulation is not necessary. Long discussion with patient and Dr. Maylene Roes and answered questions.  Greater than 50% time during this 35-minute visit was spent in counseling and coordination of care about her abnormal CT scan results discussion about further evaluation and treatment with oncology follow-up necessary. Antony Contras, MD  To contact Stroke Continuity provider, please refer to [email or web address] After hours, contact General Neurology

## 2021-01-16 NOTE — Progress Notes (Signed)
TCD w/ bubble and lower extremity venous bilateral studies completed.   Please see CV Proc for preliminary results.   Darlin Coco, RDMS, RVT

## 2021-01-16 NOTE — TOC Transition Note (Signed)
Transition of Care [facility]) - CM/SW Discharge Note   Patient Details  Name: Lori Green MRN: [id number] Date of Birth: 1966-06-19  [facility] CM/SW Contact:  Pollie Friar, RN Phone Number: 01/16/2021, 4:23 PM   Clinical Narrative:    Pt is discharging home with self care. No needs per TOC.   Final next level of care: Home/Self Care Barriers to Discharge: No Barriers Identified   Patient Goals and CMS Choice        Discharge Placement                       Discharge Plan and Services                                     Social Determinants of Health (SDOH) Interventions     Readmission Risk Interventions No flowsheet data found.

## 2021-01-16 NOTE — Discharge Summary (Signed)
Physician Discharge Summary  Lori Green VQM:[id number] DOB: 1966-04-06 DOA: 01/14/2021  PCP: Darreld Mclean, MD  Admit date: 01/14/2021 Discharge date: 01/17/2021  Admitted From: Home Disposition:  Home  Recommendations for Outpatient Follow-up:  Follow up with Oncology as scheduled at [facility]   Discharge Condition: Stable CODE STATUS: Full  Diet recommendation:    Brief/Interim Summary: Lori Green a 55 y.o.femalewith medical history significant formigraines, breast cancer, left phrenic nerve injury, OSA, hypertension, and type 2 diabetes mellitus, now presenting to the emergency department for evaluation of headache and speech difficulty. Patient reports a history of migraine but had not had one in a long time until 2 weeks ago when she began experiencing frequentheadaches similar to what she used to experience. Then, over the past 3 days she has had intermittent speech difficulty described as trouble getting the words out with some stuttering. She denies any change in vision or hearing and denies any numbness or weakness. She denies difficulty swallowing. She has history of occasional self-limited palpitations and wore ambulatory heart monitor for 2 weeks last fall which reportedly demonstrated episodes of sinus tachycardia but no atrial fibrillation or flutter. Patient reports that she used to take medications for hypertension and diabetes and used to wear CPAP but believes that her hypertension, diabetes, and OSA have resolved. She suffered a left phrenic nerve injury during a biopsy and was requiring supplemental oxygen but has had improvement in no longer feels short of breath or require supplemental oxygen. Patient could not complete MRI brain due to incompatible breast tissue expander.  She underwent CTA head and neck which was concerning for subacute ischemic CVA involving the posterior left parietal lobe.  Patient was admitted for concern of subacute ischemic stroke.  Stroke team evaluated patient. She was started on aspirin and plavix. Patient underwent repeat CT head which read as abnormal posterior left hemisphere hypodensity and enhancement pattern is more suspicious for Leptomeningeal Metastatic Disease than ischemia/infarct. Two small indeterminate skull lesions are identified.   Patient's symptoms resolved. She completed stroke work up and was recommended for daily baby aspirin. As she had received aspirin/plavix during stroke work up, LP was not pursued. I spoke with Dr. Tish Men, oncology at [facility], regarding new CT head findings and she will follow up with patient as an outpatient for LP and PET as recommended. On day of discharge, her presenting symptoms have all resolved back to baseline.    Discharge Diagnoses:  Principal Problem:   Expressive aphasia Active Problems:   Acute non intractable tension-type headache   History of left breast cancer   Migraine   Hypertension   Injury of phrenic nerve   Malignant neoplasm of left female breast (HCC)   DM (diabetes mellitus) ([place])   HLD (hyperlipidemia)    Discharge Instructions  Discharge Instructions    Call MD for:  difficulty breathing, headache or visual disturbances   Complete by: As directed    Call MD for:  extreme fatigue   Complete by: As directed    Call MD for:  persistant dizziness or light-headedness   Complete by: As directed    Call MD for:  persistant nausea and vomiting   Complete by: As directed    Call MD for:  severe uncontrolled pain   Complete by: As directed    Call MD for:  temperature >100.4   Complete by: As directed    Discharge instructions   Complete by: As directed    You were cared for by  a hospitalist during your hospital stay. If you have any questions about your discharge medications or the care you received while you were in the hospital after you are discharged, you can call the unit and ask to speak with the hospitalist on call if the hospitalist that  took care of you is not available. Once you are discharged, your primary care physician will handle any further medical issues. Please note that NO REFILLS for any discharge medications will be authorized once you are discharged, as it is imperative that you return to your primary care physician (or establish a relationship with a primary care physician if you do not have one) for your aftercare needs so that they can reassess your need for medications and monitor your lab values.   Increase activity slowly   Complete by: As directed      Allergies as of 01/16/2021      Reactions   Naproxen Nausea And Vomiting      Medication List    TAKE these medications   aspirin 81 MG EC tablet Take 1 tablet (81 mg total) by mouth daily. Swallow whole.   atorvastatin 80 MG tablet Commonly known as: LIPITOR Take 1 tablet (80 mg total) by mouth daily.   gabapentin 300 MG capsule Commonly known as: NEURONTIN Take 300 mg by mouth 3 (three) times daily.       Follow-up Information    Ray, Fabio Asa, MD Follow up.   Specialty: Hematology and Oncology Contact information: [address] [place] [address] [phone]              Allergies  Allergen Reactions  . Naproxen Nausea And Vomiting    Consultations:  Neurology    Procedures/Studies: CT Angio Head W or Wo Contrast  Result Date: 01/14/2021 CLINICAL DATA:  Headaches. History of breast carcinoma. Nausea and vomiting. EXAM: CT ANGIOGRAPHY HEAD AND NECK TECHNIQUE: Multidetector CT imaging of the head and neck was performed using the standard protocol during bolus administration of intravenous contrast. Multiplanar CT image reconstructions and MIPs were obtained to evaluate the vascular anatomy. Carotid stenosis measurements (when applicable) are obtained utilizing NASCET criteria, using the distal internal carotid diameter as the denominator. CONTRAST:  131mL OMNIPAQUE IOHEXOL 350 MG/ML SOLN COMPARISON:   None. FINDINGS: CT HEAD FINDINGS Brain: There is no mass, hemorrhage or extra-axial collection. The size and configuration of the ventricles and extra-axial CSF spaces are normal. There is an area of abnormal hypoattenuation in the posterior left parietal lobe. Brain parenchyma is otherwise normal. Skull: The visualized skull base, calvarium and extracranial soft tissues are normal. Sinuses/Orbits: No fluid levels or advanced mucosal thickening of the visualized paranasal sinuses. No mastoid or middle ear effusion. The orbits are normal. CTA NECK FINDINGS SKELETON: There is no bony spinal canal stenosis. No lytic or blastic lesion. OTHER NECK: Normal pharynx, larynx and major salivary glands. No cervical lymphadenopathy. Unremarkable thyroid gland. Breast tissue expanders. UPPER CHEST: No pneumothorax or pleural effusion. No nodules or masses. AORTIC ARCH: There is no calcific atherosclerosis of the aortic arch. There is no aneurysm, dissection or hemodynamically significant stenosis of the visualized portion of the aorta. Normal variant aortic arch branching pattern with the brachiocephalic and left common carotid arteries sharing a common origin. The visualized proximal subclavian arteries are widely patent. RIGHT CAROTID SYSTEM: Normal without aneurysm, dissection or stenosis. LEFT CAROTID SYSTEM: Normal without aneurysm, dissection or stenosis. VERTEBRAL ARTERIES: Left dominant configuration. Both origins are clearly  patent. There is no dissection, occlusion or flow-limiting stenosis to the skull base (V1-V3 segments). CTA HEAD FINDINGS POSTERIOR CIRCULATION: --Vertebral arteries: Normal V4 segments. --Inferior cerebellar arteries: Normal. --Basilar artery: Normal. --Superior cerebellar arteries: Normal. --Posterior cerebral arteries (PCA): Normal. ANTERIOR CIRCULATION: --Intracranial internal carotid arteries: Normal. --Anterior cerebral arteries (ACA): Normal. Both A1 segments are present. Patent anterior  communicating artery (a-comm). --Middle cerebral arteries (MCA): Normal. ANATOMIC VARIANTS: None Review of the MIP images confirms the above findings. IMPRESSION: 1. No intracranial arterial occlusion or high-grade stenosis. 2. 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 MRI of the brain with and without contrast is recommended for further evaluation if the breast tissue expanders can be cleared for MRI compatibility. 3. No hemorrhage. 4. No cervical carotid or vertebral artery stenosis. 5. CT venogram is reported separately. These results were called by telephone at the time of interpretation on 01/14/2021 at 7:09 pm to provider [facility] , who verbally acknowledged these results. Electronically Signed   By: Ulyses Jarred M.D.   On: 01/14/2021 19:10   CT HEAD W & WO CONTRAST  Result Date: 01/16/2021 CLINICAL DATA:  55 year old female with history of breast cancer in mastectomy, headaches, abnormal left parietal lobe on CT/CTA - possibly subacute infarct. Patient unable to have MRI. EXAM: CT HEAD WITHOUT AND WITH CONTRAST TECHNIQUE: Contiguous axial images were obtained from the base of the skull through the vertex without and with intravenous contrast CONTRAST:  47mL OMNIPAQUE IOHEXOL 300 MG/ML  SOLN COMPARISON:  CT head, CTA 01/14/2021. FINDINGS: Brain: Within the posterior left hemisphere centered at the junction of the occipital and parietal lobes, with the posterior left temporal lobe confluence, is abnormal white matter hypodensity with primarily a vasogenic edema pattern (series 4, image 15). Here the overlying cortex may be mildly thickened, but is NOT hypodense. If anything there might be slight cortical hyperdensity. Following contrast today as on the recent venogram there is what appears to be predominantly sulcal enhancement (series 3, image 15) suspicious for abnormal leptomeninges. There is also subtle regional mass effect. Elsewhere  gray-white matter differentiation appears stable and within normal limits. No other abnormal intracranial enhancement identified. No intraventricular debris is evident. No ventriculomegaly. Normal basilar cisterns. Vascular: Minimal Calcified atherosclerosis at the skull base. The major intracranial vascular structures appear to remain enhancing as expected. Skull: Occasional small indeterminate areas of decreased bone mineralization in the calvarium, such as the medial right frontal bone series 5, image 56. Left parietal bone series 5, image 47 indistinct to 6 mm area of lucency is also suspicious. But there is no overtly destructive osseous lesion identified. Sinuses/Orbits: Visualized paranasal sinuses and mastoids are stable and well pneumatized. Other: Visualized orbits and scalp soft tissues are within normal limits. IMPRESSION: 1. In the setting of breast cancer this abnormal posterior left hemisphere hypodensity and enhancement pattern is more suspicious for Leptomeningeal Metastatic Disease than ischemia/infarct. If the patient is not currently a candidate for MRI then CSF analysis may be the next best step. 2. Two small indeterminate skull lesions are identified (and the more suspicious is in the left parietal bone not far from the abnormal brain findings). PET-CT would be valuable. Electronically Signed   By: Genevie Ann M.D.   On: 01/16/2021 05:43   CT Angio Neck W and/or Wo Contrast  Result Date: 01/14/2021 CLINICAL DATA:  Headaches. History of breast carcinoma. Nausea and vomiting. EXAM: CT ANGIOGRAPHY HEAD AND NECK TECHNIQUE: Multidetector CT imaging of the  head and neck was performed using the standard protocol during bolus administration of intravenous contrast. Multiplanar CT image reconstructions and MIPs were obtained to evaluate the vascular anatomy. Carotid stenosis measurements (when applicable) are obtained utilizing NASCET criteria, using the distal internal carotid diameter as the  denominator. CONTRAST:  123mL OMNIPAQUE IOHEXOL 350 MG/ML SOLN COMPARISON:  None. FINDINGS: CT HEAD FINDINGS Brain: There is no mass, hemorrhage or extra-axial collection. The size and configuration of the ventricles and extra-axial CSF spaces are normal. There is an area of abnormal hypoattenuation in the posterior left parietal lobe. Brain parenchyma is otherwise normal. Skull: The visualized skull base, calvarium and extracranial soft tissues are normal. Sinuses/Orbits: No fluid levels or advanced mucosal thickening of the visualized paranasal sinuses. No mastoid or middle ear effusion. The orbits are normal. CTA NECK FINDINGS SKELETON: There is no bony spinal canal stenosis. No lytic or blastic lesion. OTHER NECK: Normal pharynx, larynx and major salivary glands. No cervical lymphadenopathy. Unremarkable thyroid gland. Breast tissue expanders. UPPER CHEST: No pneumothorax or pleural effusion. No nodules or masses. AORTIC ARCH: There is no calcific atherosclerosis of the aortic arch. There is no aneurysm, dissection or hemodynamically significant stenosis of the visualized portion of the aorta. Normal variant aortic arch branching pattern with the brachiocephalic and left common carotid arteries sharing a common origin. The visualized proximal subclavian arteries are widely patent. RIGHT CAROTID SYSTEM: Normal without aneurysm, dissection or stenosis. LEFT CAROTID SYSTEM: Normal without aneurysm, dissection or stenosis. VERTEBRAL ARTERIES: Left dominant configuration. Both origins are clearly patent. There is no dissection, occlusion or flow-limiting stenosis to the skull base (V1-V3 segments). CTA HEAD FINDINGS POSTERIOR CIRCULATION: --Vertebral arteries: Normal V4 segments. --Inferior cerebellar arteries: Normal. --Basilar artery: Normal. --Superior cerebellar arteries: Normal. --Posterior cerebral arteries (PCA): Normal. ANTERIOR CIRCULATION: --Intracranial internal carotid arteries: Normal. --Anterior  cerebral arteries (ACA): Normal. Both A1 segments are present. Patent anterior communicating artery (a-comm). --Middle cerebral arteries (MCA): Normal. ANATOMIC VARIANTS: None Review of the MIP images confirms the above findings. IMPRESSION: 1. No intracranial arterial occlusion or high-grade stenosis. 2. 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 MRI of the brain with and without contrast is recommended for further evaluation if the breast tissue expanders can be cleared for MRI compatibility. 3. No hemorrhage. 4. No cervical carotid or vertebral artery stenosis. 5. CT venogram is reported separately. These results were called by telephone at the time of interpretation on 01/14/2021 at 7:09 pm to provider [facility] , who verbally acknowledged these results. Electronically Signed   By: Ulyses Jarred M.D.   On: 01/14/2021 19:10   VAS [place] TRANSCRANIAL DOPPLER W BUBBLES  Result Date: 01/16/2021  Transcranial Doppler with Bubble Indications: Stroke. Comparison Study: No prior studies. Performing Technologist: Darlin Coco RDMS,RVT  Examination Guidelines: A complete evaluation includes B-mode imaging, spectral Doppler, color Doppler, and power Doppler as needed of all accessible portions of each vessel. Bilateral testing is considered an integral part of a complete examination. Limited examinations for reoccurring indications may be performed as noted.  Summary:  A vascular evaluation was performed. The right middle cerebral artery was studied. An IV was inserted into the patient's right forearm. Verbal informed consent was obtained.  10-15 HITS (high intensity transient signals) were observed with valsalva, indicating a Spencer grade 2 PFO (patent foramen ovale). *See table(s) above for TCD measurements and observations.    Preliminary    ECHOCARDIOGRAM COMPLETE  Result Date: 01/15/2021    ECHOCARDIOGRAM REPORT  Patient Name:   FLEETA KUNDE [facility] Date  of Exam: 01/15/2021 Medical Rec #:  [id number]      Height:       64.0 in Accession #:    [id number]     Weight:       204.4 lb Date of Birth:  1966/03/28      BSA:          1.974 m Patient Age:    58 years       BP:           137/93 mmHg Patient Gender: F              HR:           90 bpm. Exam Location:  Inpatient Procedure: 2D Echo and Intracardiac Opacification Agent Indications:    stroke  History:        Patient has no prior history of Echocardiogram examinations.                 Risk Factors:Hypertension and Diabetes.  Sonographer:    Johny Chess Referring Phys: [id number] TIMOTHY S OPYD  Sonographer Comments: Suboptimal apical window, suboptimal parasternal window and Technically difficult study due to poor echo windows. Image acquisition challenging due to breast implants. IMPRESSIONS  1. Left ventricular ejection fraction, by estimation, is 55 to 60%. The left ventricle has normal function. Left ventricular endocardial border not optimally defined to evaluate regional wall motion. Left ventricular diastolic function could not be evaluated.  2. Right ventricular systolic function is normal. The right ventricular size is normal. Tricuspid regurgitation signal is inadequate for assessing PA pressure.  3. The mitral valve is grossly normal. No evidence of mitral valve regurgitation. No evidence of mitral stenosis.  4. The aortic valve is grossly normal. Aortic valve regurgitation is not visualized. No aortic stenosis is present.  5. The inferior vena cava is normal in size with greater than 50% respiratory variability, suggesting right atrial pressure of 3 mmHg. Conclusion(s)/Recommendation(s): No intracardiac source of embolism detected on this transthoracic study. A transesophageal echocardiogram is recommended to exclude cardiac source of embolism if clinically indicated. FINDINGS  Left Ventricle: Left ventricular ejection fraction, by estimation, is 55 to 60%. The left ventricle has normal function. Left  ventricular endocardial border not optimally defined to evaluate regional wall motion. Definity contrast agent was given IV to delineate the left ventricular endocardial borders. The left ventricular internal cavity size was normal in size. There is no left ventricular hypertrophy. Left ventricular diastolic function could not be evaluated due to nondiagnostic images. Left ventricular diastolic function could not be evaluated. Right Ventricle: The right ventricular size is normal. No increase in right ventricular wall thickness. Right ventricular systolic function is normal. Tricuspid regurgitation signal is inadequate for assessing PA pressure. Left Atrium: Left atrial size was normal in size. Right Atrium: Right atrial size was normal in size. Pericardium: Trivial pericardial effusion is present. Presence of pericardial fat pad. Mitral Valve: The mitral valve is grossly normal. No evidence of mitral valve regurgitation. No evidence of mitral valve stenosis. Tricuspid Valve: The tricuspid valve is grossly normal. Tricuspid valve regurgitation is not demonstrated. No evidence of tricuspid stenosis. Aortic Valve: The aortic valve is grossly normal. Aortic valve regurgitation is not visualized. No aortic stenosis is present. Pulmonic Valve: The pulmonic valve was grossly normal. Pulmonic valve regurgitation is not visualized. No evidence of pulmonic stenosis. Aorta: The aortic root and ascending aorta are structurally normal, with no evidence of dilitation. Venous:  The inferior vena cava is normal in size with greater than 50% respiratory variability, suggesting right atrial pressure of 3 mmHg. IAS/Shunts: The interatrial septum was not well visualized.  LEFT VENTRICLE PLAX 2D LVIDd:         3.40 cm  Diastology LVIDs:         2.60 cm  LV e' medial:    5.22 cm/s LV PW:         1.10 cm  LV E/e' medial:  6.9 LV IVS:        1.00 cm  LV e' lateral:   8.05 cm/s LVOT diam:     1.70 cm  LV E/e' lateral: 4.5 LV SV:         31  LV SV Index:   16 LVOT Area:     2.27 cm  RIGHT VENTRICLE             IVC RV S prime:     12.50 cm/s  IVC diam: 1.40 cm LEFT ATRIUM           Index       RIGHT ATRIUM           Index LA diam:      3.60 cm 1.82 cm/m  RA Area:     13.30 cm LA Vol (A4C): 36.2 ml 18.33 ml/m RA Volume:   32.80 ml  16.61 ml/m  AORTIC VALVE LVOT Vmax:   79.00 cm/s LVOT Vmean:  54.300 cm/s LVOT VTI:    0.136 m  AORTA Ao Root diam: 2.70 cm Ao Asc diam:  2.80 cm MV E velocity: 36.00 cm/s MV A velocity: 66.80 cm/s  SHUNTS MV E/A ratio:  0.54        Systemic VTI:  0.14 m                            Systemic Diam: 1.70 cm Eleonore Chiquito MD Electronically signed by Eleonore Chiquito MD Signature Date/Time: 01/15/2021/12:12:48 PM    Final    CT VENOGRAM HEAD  Result Date: 01/14/2021 CLINICAL DATA:  Headache and speech difficulty. Multiple neurologic deficits. History of breast carcinoma. EXAM: CT VENOGRAM HEAD TECHNIQUE: Venographic phase images of the head were obtained following the administration of IV contrast. Multiplanar reformats and maximum intensity projections were created. CONTRAST:  131mL OMNIPAQUE IOHEXOL 350 MG/ML SOLN COMPARISON:  None. FINDINGS: Superior sagittal sinus: Normal. Straight sinus: Normal. Inferior sagittal sinus, vein of Galen and internal cerebral veins: Normal. Transverse sinuses: Normal. Sigmoid sinuses: Normal. Visualized jugular veins: Normal. There is gyriform enhancement at the left parietal-temporal junction. IMPRESSION: 1. No dural venous sinus thrombosis. 2. Gyriform enhancement at the left parietal-temporal junction, most consistent with subacute infarct. Electronically Signed   By: Ulyses Jarred M.D.   On: 01/14/2021 19:15   VAS [place] LOWER EXTREMITY VENOUS (DVT)  Result Date: 01/16/2021  Lower Venous DVT Study Indications: Stroke.  Risk Factors: Cancer -malignant neoplasm of left breast. Comparison Study: No prior studies. Performing Technologist: Darlin Coco RDMS,RVT  Examination Guidelines: A  complete evaluation includes B-mode imaging, spectral Doppler, color Doppler, and power Doppler as needed of all accessible portions of each vessel. Bilateral testing is considered an integral part of a complete examination. Limited examinations for reoccurring indications may be performed as noted. The reflux portion of the exam is performed with the patient in reverse Trendelenburg.  +---------+---------------+---------+-----------+----------+--------------+ RIGHT    CompressibilityPhasicitySpontaneityPropertiesThrombus Aging +---------+---------------+---------+-----------+----------+--------------+ CFV  Full           Yes      Yes                                 +---------+---------------+---------+-----------+----------+--------------+ SFJ      Full                                                        +---------+---------------+---------+-----------+----------+--------------+ FV Prox  Full                                                        +---------+---------------+---------+-----------+----------+--------------+ FV Mid   Full                                                        +---------+---------------+---------+-----------+----------+--------------+ FV DistalFull                                                        +---------+---------------+---------+-----------+----------+--------------+ PFV      Full                                                        +---------+---------------+---------+-----------+----------+--------------+ POP      Full           Yes      Yes                                 +---------+---------------+---------+-----------+----------+--------------+ PTV      Full                                                        +---------+---------------+---------+-----------+----------+--------------+ PERO     Full                                                         +---------+---------------+---------+-----------+----------+--------------+   +---------+---------------+---------+-----------+----------+--------------+ LEFT     CompressibilityPhasicitySpontaneityPropertiesThrombus Aging +---------+---------------+---------+-----------+----------+--------------+ CFV      Full           Yes      Yes                                 +---------+---------------+---------+-----------+----------+--------------+  SFJ      Full                                                        +---------+---------------+---------+-----------+----------+--------------+ FV Prox  Full                                                        +---------+---------------+---------+-----------+----------+--------------+ FV Mid   Full                                                        +---------+---------------+---------+-----------+----------+--------------+ FV DistalFull                                                        +---------+---------------+---------+-----------+----------+--------------+ PFV      Full                                                        +---------+---------------+---------+-----------+----------+--------------+ POP      Full           Yes      Yes                                 +---------+---------------+---------+-----------+----------+--------------+ PTV      Full                                                        +---------+---------------+---------+-----------+----------+--------------+ PERO     Full                                                        +---------+---------------+---------+-----------+----------+--------------+     Summary: RIGHT: - There is no evidence of deep vein thrombosis in the lower extremity.  - No cystic structure found in the popliteal fossa.  LEFT: - There is no evidence of deep vein thrombosis in the lower extremity.  - No cystic structure found in the popliteal fossa.   *See table(s) above for measurements and observations.    Preliminary        Discharge Exam: Vitals:   01/16/21 1126 01/16/21 1627  BP: 130/86 (!) 147/85  Pulse: (!) 102 (!) 109  Resp: 20 19  Temp: 98.4 F (36.9 C) 98.4 F (36.9 C)  SpO2: 99% 99%  General: Pt is alert, awake, not in acute distress Cardiovascular: RRR, S1/S2 +, no edema Respiratory: CTA bilaterally, no wheezing, no rhonchi, no respiratory distress, no conversational dyspnea  Abdominal: Soft, NT, ND, bowel sounds + Extremities: no edema, no cyanosis Psych: Normal mood and affect, stable judgement and insight     The results of significant diagnostics from this hospitalization (including imaging, microbiology, ancillary and laboratory) are listed below for reference.     Microbiology: Recent Results (from the past 240 hour(s))  Resp Panel by RT-PCR (Flu A&B, Covid) Nasopharyngeal Swab     Status: None   Collection Time: 01/14/21 10:11 PM   Specimen: Nasopharyngeal Swab; Nasopharyngeal(NP) swabs in vial transport medium  Result Value Ref Range Status   SARS Coronavirus 2 by RT PCR NEGATIVE NEGATIVE Final    Comment: (NOTE) SARS-CoV-2 target nucleic acids are NOT DETECTED.  The SARS-CoV-2 RNA is generally detectable in upper respiratory specimens during the acute phase of infection. The lowest concentration of SARS-CoV-2 viral copies this assay can detect is 138 copies/mL. A negative result does not preclude SARS-Cov-2 infection and should not be used as the sole basis for treatment or other patient management decisions. A negative result may occur with  improper specimen collection/handling, submission of specimen other than nasopharyngeal swab, presence of viral mutation(s) within the areas targeted by this assay, and inadequate number of viral copies(<138 copies/mL). A negative result must be combined with clinical observations, patient history, and epidemiological information. The expected result is  Negative.  Fact Sheet for Patients:  EntrepreneurPulse.com.au  Fact Sheet for Healthcare Providers:  IncredibleEmployment.be  This test is no t yet approved or cleared by the [place] FDA and  has been authorized for detection and/or diagnosis of SARS-CoV-2 by FDA under an Emergency Use Authorization (EUA). This EUA will remain  in effect (meaning this test can be used) for the duration of the COVID-19 declaration under Section 564(b)(1) of the Act, 21 U.S.C.section 360bbb-3(b)(1), unless the authorization is terminated  or revoked sooner.       Influenza A by PCR NEGATIVE NEGATIVE Final   Influenza B by PCR NEGATIVE NEGATIVE Final    Comment: (NOTE) The Xpert Xpress SARS-CoV-2/FLU/RSV plus assay is intended as an aid in the diagnosis of influenza from Nasopharyngeal swab specimens and should not be used as a sole basis for treatment. Nasal washings and aspirates are unacceptable for Xpert Xpress SARS-CoV-2/FLU/RSV testing.  Fact Sheet for Patients: EntrepreneurPulse.com.au  Fact Sheet for Healthcare Providers: IncredibleEmployment.be  This test is not yet approved or cleared by the [place] FDA and has been authorized for detection and/or diagnosis of SARS-CoV-2 by FDA under an Emergency Use Authorization (EUA). This EUA will remain in effect (meaning this test can be used) for the duration of the COVID-19 declaration under Section 564(b)(1) of the Act, 21 U.S.C. section 360bbb-3(b)(1), unless the authorization is terminated or revoked.  Performed at [facility], [address] [place], [place] [address]      Labs: BNP (last 3 results) No results for input(s): BNP in the last 8760 hours. Basic Metabolic Panel: Recent Labs  Lab 01/14/21 1724 01/15/21 0350 01/16/21 0157  NA 137 137 134*  K 3.8 3.9 3.9  CL 97* 101 101  CO2 27 29 27   GLUCOSE 128* 109* 112*  BUN 13 6 9    CREATININE 0.95 0.82 0.89  CALCIUM 10.0 9.9 9.4   Liver Function Tests: Recent Labs  Lab 01/14/21 1724  AST 23  ALT  17  ALKPHOS 90  BILITOT 0.4  PROT 8.7*  ALBUMIN 4.0   No results for input(s): LIPASE, AMYLASE in the last 168 hours. No results for input(s): AMMONIA in the last 168 hours. CBC: Recent Labs  Lab 01/14/21 1724 01/15/21 0350 01/16/21 0157  WBC 5.1 4.4 3.4*  HGB 11.1* 10.5* 9.9*  HCT 34.6* 31.7* 30.2*  MCV 89.6 89.5 89.1  PLT 324 298 277   Cardiac Enzymes: No results for input(s): CKTOTAL, CKMB, CKMBINDEX, TROPONINI in the last 168 hours. BNP: Invalid input(s): POCBNP CBG: No results for input(s): GLUCAP in the last 168 hours. D-Dimer No results for input(s): DDIMER in the last 72 hours. Hgb A1c Recent Labs    01/15/21 0350  HGBA1C 6.6*   Lipid Profile Recent Labs    01/15/21 0350  CHOL 269*  HDL 46  LDLCALC 194*  TRIG 143  CHOLHDL 5.8   Thyroid function studies No results for input(s): TSH, T4TOTAL, T3FREE, THYROIDAB in the last 72 hours.  Invalid input(s): FREET3 Anemia work up No results for input(s): VITAMINB12, FOLATE, FERRITIN, TIBC, IRON, RETICCTPCT in the last 72 hours. Urinalysis    Component Value Date/Time   COLORURINE YELLOW 06/17/2015 1940   APPEARANCEUR CLEAR 06/17/2015 1940   LABSPEC 1.023 06/17/2015 1940   PHURINE 6.5 06/17/2015 1940   GLUCOSEU NEGATIVE 06/17/2015 1940   HGBUR MODERATE (A) 06/17/2015 1940   BILIRUBINUR NEGATIVE 06/17/2015 1940   KETONESUR NEGATIVE 06/17/2015 1940   PROTEINUR NEGATIVE 06/17/2015 1940   UROBILINOGEN 1.0 06/17/2015 1940   NITRITE NEGATIVE 06/17/2015 1940   LEUKOCYTESUR NEGATIVE 06/17/2015 1940   Sepsis Labs Invalid input(s): PROCALCITONIN,  WBC,  LACTICIDVEN Microbiology Recent Results (from the past 240 hour(s))  Resp Panel by RT-PCR (Flu A&B, Covid) Nasopharyngeal Swab     Status: None   Collection Time: 01/14/21 10:11 PM   Specimen: Nasopharyngeal Swab; Nasopharyngeal(NP)  swabs in vial transport medium  Result Value Ref Range Status   SARS Coronavirus 2 by RT PCR NEGATIVE NEGATIVE Final    Comment: (NOTE) SARS-CoV-2 target nucleic acids are NOT DETECTED.  The SARS-CoV-2 RNA is generally detectable in upper respiratory specimens during the acute phase of infection. The lowest concentration of SARS-CoV-2 viral copies this assay can detect is 138 copies/mL. A negative result does not preclude SARS-Cov-2 infection and should not be used as the sole basis for treatment or other patient management decisions. A negative result may occur with  improper specimen collection/handling, submission of specimen other than nasopharyngeal swab, presence of viral mutation(s) within the areas targeted by this assay, and inadequate number of viral copies(<138 copies/mL). A negative result must be combined with clinical observations, patient history, and epidemiological information. The expected result is Negative.  Fact Sheet for Patients:  EntrepreneurPulse.com.au  Fact Sheet for Healthcare Providers:  IncredibleEmployment.be  This test is no t yet approved or cleared by the [place] FDA and  has been authorized for detection and/or diagnosis of SARS-CoV-2 by FDA under an Emergency Use Authorization (EUA). This EUA will remain  in effect (meaning this test can be used) for the duration of the COVID-19 declaration under Section 564(b)(1) of the Act, 21 U.S.C.section 360bbb-3(b)(1), unless the authorization is terminated  or revoked sooner.       Influenza A by PCR NEGATIVE NEGATIVE Final   Influenza B by PCR NEGATIVE NEGATIVE Final    Comment: (NOTE) The Xpert Xpress SARS-CoV-2/FLU/RSV plus assay is intended as an aid in the diagnosis of influenza from Nasopharyngeal swab specimens and  should not be used as a sole basis for treatment. Nasal washings and aspirates are unacceptable for Xpert Xpress  SARS-CoV-2/FLU/RSV testing.  Fact Sheet for Patients: EntrepreneurPulse.com.au  Fact Sheet for Healthcare Providers: IncredibleEmployment.be  This test is not yet approved or cleared by the [place] FDA and has been authorized for detection and/or diagnosis of SARS-CoV-2 by FDA under an Emergency Use Authorization (EUA). This EUA will remain in effect (meaning this test can be used) for the duration of the COVID-19 declaration under Section 564(b)(1) of the Act, 21 U.S.C. section 360bbb-3(b)(1), unless the authorization is terminated or revoked.  Performed at [facility], [address] [place], [place] [address]      Patient was seen and examined on the day of discharge and was found to be in stable condition. Time coordinating discharge: 40 minutes including assessment and coordination of care, as well as examination of the patient.   SIGNED:  Dessa Phi, DO [facility] 01/17/2021, 7:22 AM

## 2021-01-16 NOTE — Plan of Care (Deleted)
Pt admitted from [facility] on 01/13/2021 at approximately 1900. Pt is A&Ox4 with c/o headache that is being treated with PRN analgesic. Pt has trouble finding the occasional word and speech is slightly slurred with some words. NIH: 1. Pt passed [facility] swallow screen and diet has been advanced to heart healthy/carb mod. Pt on 4LNC secondary to COPD history and active smoking. Pt educated on call bell usage and fall prevention strategies. Pt also educated on smoking cessation importance and new anti-hypertensive medications. Pt has agreed with plan of care. Will continue to monitor.   Problem: Health Behavior/Discharge Planning: Goal: Ability to manage health-related needs will improve Outcome: Progressing   Problem: Clinical Measurements: Goal: Ability to maintain clinical measurements within normal limits will improve Outcome: Progressing Goal: Will remain free from infection Outcome: Progressing Goal: Diagnostic test results will improve Outcome: Progressing Goal: Respiratory complications will improve Outcome: Progressing Goal: Cardiovascular complication will be avoided Outcome: Progressing   Problem: Activity: Goal: Risk for activity intolerance will decrease Outcome: Progressing   Problem: Nutrition: Goal: Adequate nutrition will be maintained Outcome: Progressing   Problem: Coping: Goal: Level of anxiety will decrease Outcome: Progressing   Problem: Skin Integrity: Goal: Risk for impaired skin integrity will decrease Outcome: Progressing   Problem: Education: Goal: Knowledge of disease or condition will improve Outcome: Progressing Goal: Knowledge of secondary prevention will improve Outcome: Progressing Goal: Knowledge of patient specific risk factors addressed and post discharge goals established will improve Outcome: Progressing   Problem: Nutrition: Goal: Risk of aspiration will decrease Outcome: Progressing   Problem: Coping: Goal: Will identify appropriate  support needs Outcome: Progressing

## 2021-01-16 NOTE — Progress Notes (Signed)
Pt wheeled out by this RN. All belongings in pt's bag in her lap.

## 2021-01-17 ENCOUNTER — Ambulatory Visit: Admit: 2021-01-17 | Discharge: 2021-01-18 | Payer: PRIVATE HEALTH INSURANCE

## 2021-01-17 DIAGNOSIS — Z853 Personal history of malignant neoplasm of breast: Principal | ICD-10-CM

## 2021-01-17 MED ORDER — ATORVASTATIN 80 MG TABLET
Freq: Every day | ORAL | 0 days
Start: 2021-01-17 — End: ?

## 2021-01-17 MED ORDER — ASPIRIN 81 MG TABLET,DELAYED RELEASE
ORAL | 0 days
Start: 2021-01-17 — End: 2021-04-17

## 2021-01-19 ENCOUNTER — Ambulatory Visit
Admit: 2021-01-19 | Discharge: 2021-01-20 | Payer: PRIVATE HEALTH INSURANCE | Attending: Hematology & Oncology | Primary: Hematology & Oncology

## 2021-01-19 DIAGNOSIS — Z170 Estrogen receptor positive status [ER+]: Principal | ICD-10-CM

## 2021-01-19 DIAGNOSIS — C50212 Malignant neoplasm of upper-inner quadrant of left female breast: Principal | ICD-10-CM

## 2021-01-23 MED ORDER — DEXAMETHASONE 4 MG TABLET
ORAL_TABLET | Freq: Every day | ORAL | 0 refills | 30 days | Status: CP
Start: 2021-01-23 — End: 2022-01-23

## 2021-01-24 ENCOUNTER — Ambulatory Visit: Admit: 2021-01-24 | Discharge: 2021-02-22 | Payer: PRIVATE HEALTH INSURANCE

## 2021-01-24 ENCOUNTER — Ambulatory Visit: Admit: 2021-01-24 | Discharge: 2021-02-06 | Payer: PRIVATE HEALTH INSURANCE

## 2021-01-25 DIAGNOSIS — Z170 Estrogen receptor positive status [ER+]: Principal | ICD-10-CM

## 2021-01-25 DIAGNOSIS — C50212 Malignant neoplasm of upper-inner quadrant of left female breast: Principal | ICD-10-CM

## 2021-01-25 MED ORDER — OXYCODONE 5 MG TABLET
ORAL_TABLET | ORAL | 0 refills | 5.00000 days | Status: CP | PRN
Start: 2021-01-25 — End: ?

## 2021-01-26 ENCOUNTER — Ambulatory Visit: Admit: 2021-01-26 | Discharge: 2021-01-27 | Payer: PRIVATE HEALTH INSURANCE

## 2021-01-26 ENCOUNTER — Other Ambulatory Visit: Admit: 2021-01-26 | Discharge: 2021-01-27 | Payer: PRIVATE HEALTH INSURANCE

## 2021-01-26 DIAGNOSIS — C50212 Malignant neoplasm of upper-inner quadrant of left female breast: Principal | ICD-10-CM

## 2021-01-26 DIAGNOSIS — Z170 Estrogen receptor positive status [ER+]: Principal | ICD-10-CM

## 2021-01-31 ENCOUNTER — Encounter
Admit: 2021-01-31 | Discharge: 2021-01-31 | Payer: PRIVATE HEALTH INSURANCE | Attending: Hematology & Oncology | Primary: Hematology & Oncology

## 2021-01-31 ENCOUNTER — Ambulatory Visit: Admit: 2021-01-31 | Discharge: 2021-02-01

## 2021-01-31 DIAGNOSIS — Z170 Estrogen receptor positive status [ER+]: Principal | ICD-10-CM

## 2021-01-31 DIAGNOSIS — N3000 Acute cystitis without hematuria: Principal | ICD-10-CM

## 2021-01-31 DIAGNOSIS — C50212 Malignant neoplasm of upper-inner quadrant of left female breast: Principal | ICD-10-CM

## 2021-02-01 ENCOUNTER — Ambulatory Visit: Admit: 2021-02-01 | Discharge: 2021-02-02

## 2021-02-01 DIAGNOSIS — Z170 Estrogen receptor positive status [ER+]: Principal | ICD-10-CM

## 2021-02-01 DIAGNOSIS — N3000 Acute cystitis without hematuria: Principal | ICD-10-CM

## 2021-02-01 DIAGNOSIS — C50212 Malignant neoplasm of upper-inner quadrant of left female breast: Principal | ICD-10-CM

## 2021-02-01 MED ORDER — SULFAMETHOXAZOLE 800 MG-TRIMETHOPRIM 160 MG TABLET
ORAL_TABLET | Freq: Two times a day (BID) | ORAL | 0 refills | 3 days | Status: CP
Start: 2021-02-01 — End: 2021-02-04
  Filled 2021-02-01: qty 6, 3d supply, fill #0

## 2021-02-02 ENCOUNTER — Ambulatory Visit: Admit: 2021-02-02 | Discharge: 2021-02-03

## 2021-02-03 ENCOUNTER — Ambulatory Visit: Admit: 2021-02-03 | Discharge: 2021-02-04

## 2021-02-03 ENCOUNTER — Ambulatory Visit
Admit: 2021-02-03 | Discharge: 2021-03-04 | Payer: PRIVATE HEALTH INSURANCE | Attending: Radiation Oncology | Primary: Radiation Oncology

## 2021-02-03 ENCOUNTER — Ambulatory Visit: Admit: 2021-02-03 | Discharge: 2021-03-04 | Payer: PRIVATE HEALTH INSURANCE

## 2021-02-06 ENCOUNTER — Ambulatory Visit: Admit: 2021-02-06 | Discharge: 2021-02-07

## 2021-02-06 MED ORDER — OXYCODONE 5 MG TABLET
ORAL_TABLET | ORAL | 0 refills | 10 days | Status: CP | PRN
Start: 2021-02-06 — End: 2021-02-20
  Filled 2021-02-06: qty 60, 10d supply, fill #0

## 2021-02-06 MED ORDER — DEXAMETHASONE 4 MG TABLET
ORAL_TABLET | Freq: Two times a day (BID) | ORAL | 1 refills | 15.00000 days | Status: CP
Start: 2021-02-06 — End: 2022-02-06

## 2021-02-06 MED ORDER — FLUCONAZOLE 100 MG TABLET
ORAL_TABLET | 2 refills | 0 days | Status: CP
Start: 2021-02-06 — End: ?
  Filled 2021-02-06: qty 8, 7d supply, fill #0

## 2021-02-06 MED ORDER — POLYETHYLENE GLYCOL 3350 17 GRAM/DOSE ORAL POWDER
Freq: Two times a day (BID) | ORAL | 3 refills | 30.00000 days | Status: CP
Start: 2021-02-06 — End: 2021-03-08
  Filled 2021-02-06: qty 1020, 30d supply, fill #0

## 2021-02-07 ENCOUNTER — Ambulatory Visit: Admit: 2021-02-07 | Discharge: 2021-02-08

## 2021-02-08 ENCOUNTER — Ambulatory Visit: Admit: 2021-02-08 | Discharge: 2021-02-09

## 2021-02-09 ENCOUNTER — Ambulatory Visit: Admit: 2021-02-09 | Discharge: 2021-02-10

## 2021-02-09 ENCOUNTER — Ambulatory Visit
Admit: 2021-02-09 | Discharge: 2021-02-10 | Payer: PRIVATE HEALTH INSURANCE | Attending: Hematology & Oncology | Primary: Hematology & Oncology

## 2021-02-11 ENCOUNTER — Emergency Department (HOSPITAL_COMMUNITY): Payer: Commercial Managed Care - PPO

## 2021-02-11 ENCOUNTER — Encounter (HOSPITAL_COMMUNITY): Payer: Self-pay | Admitting: Emergency Medicine

## 2021-02-11 ENCOUNTER — Inpatient Hospital Stay (HOSPITAL_COMMUNITY)
Admission: EM | Admit: 2021-02-11 | Discharge: 2021-02-15 | DRG: 054 | Disposition: A | Discharge status: Hospice | Payer: Commercial Managed Care - PPO | Attending: Student | Admitting: Student

## 2021-02-11 ENCOUNTER — Other Ambulatory Visit: Payer: Self-pay

## 2021-02-11 DIAGNOSIS — G9349 Other encephalopathy: Secondary | ICD-10-CM | POA: Diagnosis present

## 2021-02-11 DIAGNOSIS — Z888 Allergy status to other drugs, medicaments and biological substances status: Secondary | ICD-10-CM

## 2021-02-11 DIAGNOSIS — Z8673 Personal history of transient ischemic attack (TIA), and cerebral infarction without residual deficits: Secondary | ICD-10-CM

## 2021-02-11 DIAGNOSIS — E1165 Type 2 diabetes mellitus with hyperglycemia: Secondary | ICD-10-CM | POA: Diagnosis present

## 2021-02-11 DIAGNOSIS — E860 Dehydration: Secondary | ICD-10-CM

## 2021-02-11 DIAGNOSIS — C7931 Secondary malignant neoplasm of brain: Principal | ICD-10-CM | POA: Diagnosis present

## 2021-02-11 DIAGNOSIS — R5381 Other malaise: Secondary | ICD-10-CM

## 2021-02-11 DIAGNOSIS — Z7951 Long term (current) use of inhaled steroids: Secondary | ICD-10-CM

## 2021-02-11 DIAGNOSIS — Z8249 Family history of ischemic heart disease and other diseases of the circulatory system: Secondary | ICD-10-CM

## 2021-02-11 DIAGNOSIS — I10 Essential (primary) hypertension: Secondary | ICD-10-CM | POA: Diagnosis present

## 2021-02-11 DIAGNOSIS — Z9221 Personal history of antineoplastic chemotherapy: Secondary | ICD-10-CM

## 2021-02-11 DIAGNOSIS — Z880 Allergy status to penicillin: Secondary | ICD-10-CM

## 2021-02-11 DIAGNOSIS — R627 Adult failure to thrive: Secondary | ICD-10-CM | POA: Diagnosis present

## 2021-02-11 DIAGNOSIS — R4701 Aphasia: Secondary | ICD-10-CM | POA: Diagnosis present

## 2021-02-11 DIAGNOSIS — E669 Obesity, unspecified: Secondary | ICD-10-CM | POA: Diagnosis present

## 2021-02-11 DIAGNOSIS — J45909 Unspecified asthma, uncomplicated: Secondary | ICD-10-CM | POA: Diagnosis present

## 2021-02-11 DIAGNOSIS — Z6834 Body mass index (BMI) 34.0-34.9, adult: Secondary | ICD-10-CM

## 2021-02-11 DIAGNOSIS — R739 Hyperglycemia, unspecified: Secondary | ICD-10-CM

## 2021-02-11 DIAGNOSIS — Z170 Estrogen receptor positive status [ER+]: Secondary | ICD-10-CM

## 2021-02-11 DIAGNOSIS — C50912 Malignant neoplasm of unspecified site of left female breast: Secondary | ICD-10-CM

## 2021-02-11 DIAGNOSIS — N179 Acute kidney failure, unspecified: Secondary | ICD-10-CM | POA: Diagnosis present

## 2021-02-11 DIAGNOSIS — C7949 Secondary malignant neoplasm of other parts of nervous system: Secondary | ICD-10-CM | POA: Diagnosis not present

## 2021-02-11 DIAGNOSIS — Z9013 Acquired absence of bilateral breasts and nipples: Secondary | ICD-10-CM

## 2021-02-11 DIAGNOSIS — Z7984 Long term (current) use of oral hypoglycemic drugs: Secondary | ICD-10-CM

## 2021-02-11 DIAGNOSIS — Z7982 Long term (current) use of aspirin: Secondary | ICD-10-CM

## 2021-02-11 DIAGNOSIS — Z803 Family history of malignant neoplasm of breast: Secondary | ICD-10-CM

## 2021-02-11 DIAGNOSIS — G9340 Encephalopathy, unspecified: Secondary | ICD-10-CM | POA: Diagnosis not present

## 2021-02-11 DIAGNOSIS — R7989 Other specified abnormal findings of blood chemistry: Secondary | ICD-10-CM

## 2021-02-11 DIAGNOSIS — F419 Anxiety disorder, unspecified: Secondary | ICD-10-CM | POA: Diagnosis present

## 2021-02-11 DIAGNOSIS — R638 Other symptoms and signs concerning food and fluid intake: Secondary | ICD-10-CM

## 2021-02-11 DIAGNOSIS — Z20822 Contact with and (suspected) exposure to covid-19: Secondary | ICD-10-CM | POA: Diagnosis present

## 2021-02-11 DIAGNOSIS — Z7189 Other specified counseling: Secondary | ICD-10-CM

## 2021-02-11 DIAGNOSIS — I160 Hypertensive urgency: Secondary | ICD-10-CM | POA: Diagnosis present

## 2021-02-11 DIAGNOSIS — Z833 Family history of diabetes mellitus: Secondary | ICD-10-CM

## 2021-02-11 DIAGNOSIS — Z923 Personal history of irradiation: Secondary | ICD-10-CM

## 2021-02-11 DIAGNOSIS — K5900 Constipation, unspecified: Secondary | ICD-10-CM | POA: Diagnosis present

## 2021-02-11 DIAGNOSIS — Z853 Personal history of malignant neoplasm of breast: Secondary | ICD-10-CM

## 2021-02-11 DIAGNOSIS — Z79899 Other long term (current) drug therapy: Secondary | ICD-10-CM

## 2021-02-11 DIAGNOSIS — G43909 Migraine, unspecified, not intractable, without status migrainosus: Secondary | ICD-10-CM | POA: Diagnosis present

## 2021-02-11 DIAGNOSIS — G936 Cerebral edema: Secondary | ICD-10-CM | POA: Diagnosis present

## 2021-02-11 DIAGNOSIS — Z823 Family history of stroke: Secondary | ICD-10-CM

## 2021-02-11 DIAGNOSIS — G4733 Obstructive sleep apnea (adult) (pediatric): Secondary | ICD-10-CM | POA: Diagnosis present

## 2021-02-11 LAB — URINALYSIS, ROUTINE W REFLEX MICROSCOPIC
Bilirubin Urine: NEGATIVE
Glucose, UA: NEGATIVE mg/dL
Ketones, ur: NEGATIVE mg/dL
Leukocytes,Ua: NEGATIVE
Nitrite: NEGATIVE
Protein, ur: NEGATIVE mg/dL
Specific Gravity, Urine: 1.019 (ref 1.005–1.030)
pH: 5 (ref 5.0–8.0)

## 2021-02-11 LAB — CBC WITH DIFFERENTIAL/PLATELET
Abs Immature Granulocytes: 0.04 10*3/uL (ref 0.00–0.07)
Basophils Absolute: 0 10*3/uL (ref 0.0–0.1)
Basophils Relative: 0 %
Eosinophils Absolute: 0 10*3/uL (ref 0.0–0.5)
Eosinophils Relative: 0 %
HCT: 44.5 % (ref 36.0–46.0)
Hemoglobin: 14.4 g/dL (ref 12.0–15.0)
Immature Granulocytes: 1 %
Lymphocytes Relative: 3 %
Lymphs Abs: 0.3 10*3/uL — ABNORMAL LOW (ref 0.7–4.0)
MCH: 28 pg (ref 26.0–34.0)
MCHC: 32.4 g/dL (ref 30.0–36.0)
MCV: 86.6 fL (ref 80.0–100.0)
Monocytes Absolute: 0.3 10*3/uL (ref 0.1–1.0)
Monocytes Relative: 4 %
Neutro Abs: 7.9 10*3/uL — ABNORMAL HIGH (ref 1.7–7.7)
Neutrophils Relative %: 92 %
Platelets: 194 10*3/uL (ref 150–400)
RBC: 5.14 MIL/uL — ABNORMAL HIGH (ref 3.87–5.11)
RDW: 14.9 % (ref 11.5–15.5)
WBC: 8.5 10*3/uL (ref 4.0–10.5)
nRBC: 0 % (ref 0.0–0.2)

## 2021-02-11 LAB — COMPREHENSIVE METABOLIC PANEL
ALT: 56 U/L — ABNORMAL HIGH (ref 0–44)
AST: 59 U/L — ABNORMAL HIGH (ref 15–41)
Albumin: 4.1 g/dL (ref 3.5–5.0)
Alkaline Phosphatase: 151 U/L — ABNORMAL HIGH (ref 38–126)
Anion gap: 12 (ref 5–15)
BUN: 69 mg/dL — ABNORMAL HIGH (ref 6–20)
CO2: 23 mmol/L (ref 22–32)
Calcium: 9.5 mg/dL (ref 8.9–10.3)
Chloride: 103 mmol/L (ref 98–111)
Creatinine, Ser: 1.63 mg/dL — ABNORMAL HIGH (ref 0.44–1.00)
GFR, Estimated: 37 mL/min — ABNORMAL LOW (ref >60)
Glucose, Bld: 220 mg/dL — ABNORMAL HIGH (ref 70–99)
Potassium: 5.1 mmol/L (ref 3.5–5.1)
Sodium: 138 mmol/L (ref 135–145)
Total Bilirubin: 1 mg/dL (ref 0.3–1.2)
Total Protein: 8 g/dL (ref 6.5–8.1)

## 2021-02-11 MED ORDER — INSULIN ASPART 100 UNIT/ML ~~LOC~~ SOLN
0.0000 [IU] | Freq: Every day | SUBCUTANEOUS | Status: DC
  Filled 2021-02-11: qty 0.05

## 2021-02-11 MED ORDER — SENNOSIDES-DOCUSATE SODIUM 8.6-50 MG PO TABS: 1.0000 | ORAL_TABLET | Freq: Two times a day (BID) | ORAL | Status: DC | PRN

## 2021-02-11 MED ORDER — LABETALOL HCL 5 MG/ML IV SOLN
10.0000 mg | INTRAVENOUS | Status: DC | PRN
  Administered 2021-02-12: 10 mg via INTRAVENOUS

## 2021-02-11 MED ORDER — LACTATED RINGERS IV BOLUS: 1000.0000 mL | Freq: Once | INTRAVENOUS | Status: DC

## 2021-02-11 MED ORDER — LACTATED RINGERS IV BOLUS
1000.0000 mL | Freq: Once | INTRAVENOUS | Status: AC
  Administered 2021-02-11: 1000 mL via INTRAVENOUS

## 2021-02-11 MED ORDER — SODIUM CHLORIDE 0.9 % IV SOLN: INTRAVENOUS | Status: AC

## 2021-02-11 MED ORDER — INSULIN ASPART 100 UNIT/ML ~~LOC~~ SOLN
0.0000 [IU] | Freq: Three times a day (TID) | SUBCUTANEOUS | Status: DC
  Administered 2021-02-13: 2 [IU] via SUBCUTANEOUS
  Filled 2021-02-11: qty 0.09

## 2021-02-11 MED ORDER — DEXAMETHASONE SODIUM PHOSPHATE 4 MG/ML IJ SOLN
4.0000 mg | Freq: Two times a day (BID) | INTRAMUSCULAR | Status: DC
  Administered 2021-02-11: 4 mg via INTRAVENOUS
  Filled 2021-02-11: qty 1

## 2021-02-11 MED ORDER — DEXAMETHASONE 4 MG PO TABS: 4.0000 mg | ORAL_TABLET | Freq: Two times a day (BID) | ORAL | Status: DC

## 2021-02-11 MED ORDER — LORAZEPAM 2 MG/ML IJ SOLN
0.5000 mg | Freq: Once | INTRAMUSCULAR | Status: AC
  Administered 2021-02-11: 0.5 mg via INTRAVENOUS
  Filled 2021-02-11: qty 1

## 2021-02-11 MED ORDER — BISACODYL 10 MG RE SUPP: 10.0000 mg | Freq: Two times a day (BID) | RECTAL | Status: DC | PRN

## 2021-02-11 MED ORDER — LABETALOL HCL 5 MG/ML IV SOLN
INTRAVENOUS | Status: AC
  Filled 2021-02-11: qty 4

## 2021-02-11 MED ORDER — ATORVASTATIN CALCIUM 40 MG PO TABS
80.0000 mg | ORAL_TABLET | Freq: Every day | ORAL | Status: DC
  Filled 2021-02-11: qty 2

## 2021-02-11 MED ORDER — DOCUSATE SODIUM 100 MG PO CAPS
100.0000 mg | ORAL_CAPSULE | Freq: Two times a day (BID) | ORAL | Status: DC
  Filled 2021-02-11: qty 1

## 2021-02-11 MED ORDER — IPRATROPIUM-ALBUTEROL 0.5-2.5 (3) MG/3ML IN SOLN: 3.0000 mL | Freq: Four times a day (QID) | RESPIRATORY_TRACT | Status: DC | PRN

## 2021-02-11 MED ORDER — LACTATED RINGERS IV SOLN: INTRAVENOUS | Status: DC

## 2021-02-11 MED ORDER — ENOXAPARIN SODIUM 40 MG/0.4ML ~~LOC~~ SOLN: 40.0000 mg | SUBCUTANEOUS | Status: DC

## 2021-02-11 MED ORDER — SODIUM CHLORIDE 0.9 % IV BOLUS
500.0000 mL | Freq: Once | INTRAVENOUS | Status: AC
  Administered 2021-02-11: 500 mL via INTRAVENOUS

## 2021-02-11 MED ORDER — ASPIRIN EC 81 MG PO TBEC
81.0000 mg | DELAYED_RELEASE_TABLET | Freq: Every day | ORAL | Status: DC
  Filled 2021-02-11: qty 1

## 2021-02-11 NOTE — ED Triage Notes (Signed)
Patient BIBA c/o dehydration, AMS and FTT as reported by family. Patient currently AOx1 per EMS. Hx of breast cancer with mets to brain. Family denies fever or chills. Reports she has not been able to eat over the past 3 days.   CBG 226 BP 160/100 P 112 SpO2 98% RA RR 16

## 2021-02-11 NOTE — ED Notes (Signed)
ED TO INPATIENT HANDOFF REPORT  Name/Age/Gender Lori Green 55 y.o. female  Code Status    Code Status Orders  (From admission, onward)         Start     Ordered   02/11/21 1742  Full code  Continuous        02/11/21 1743        Code Status History    Date Active Date Inactive Code Status Order ID Comments User Context   01/15/2021 0346 01/16/2021 2258 Full Code [id number]  Vianne Bulls, MD Inpatient   10/23/2017 0115 10/24/2017 1546 Full Code [id number]  Rise Patience, MD Inpatient   07/19/2015 1137 07/20/2015 1806 Full Code [id number]  Lavonia Drafts, MD Inpatient   Advance Care Planning Activity      Home/SNF/Other Home  Chief Complaint Acute encephalopathy [G93.40]  Level of Care/Admitting Diagnosis ED Disposition    ED Disposition Condition [facility] [[id number]]  Level of Care: Telemetry [5]  Admit to tele based on following criteria: Other see comments  Comments: AMS  Covid Evaluation: Asymptomatic Screening Protocol (No Symptoms)  Diagnosis: Acute encephalopathy [702637]  Admitting Physician: Mercy Riding [[id number]]  Attending Physician: Mercy Riding [id number]       Medical History Past Medical History:  Diagnosis Date  . Asthma exacerbation 10/22/2017  . Bartholin gland cyst   . Fluid excess   . History of diabetes mellitus 01/15/2021  . Hypertension   . Hypertension 01/15/2021  . 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 Menorrhagia   . OSA (obstructive sleep apnea) 01/15/2021  . SVD (spontaneous vaginal delivery)    x 6    Allergies Allergies  Allergen Reactions  . Naproxen Nausea And Vomiting  . Penicillins Other (See Comments)    unknown    IV Location/Drains/Wounds Patient Lines/Drains/Airways Status    Active Line/Drains/Airways    Name Placement date Placement time Site Days   Peripheral IV 01/14/21 Right Antecubital 01/14/21  1729  Antecubital  28           Labs/Imaging Results for orders placed or performed during the hospital encounter of 02/11/21 (from the past 48 hour(s))  CBC with Differential/Platelet     Status: Abnormal   Collection Time: 02/11/21  2:37 PM  Result Value Ref Range   WBC 8.5 4.0 - 10.5 K/uL   RBC 5.14 (H) 3.87 - 5.11 MIL/uL   Hemoglobin 14.4 12.0 - 15.0 g/dL   HCT 44.5 36.0 - 46.0 %   MCV 86.6 80.0 - 100.0 fL   MCH 28.0 26.0 - 34.0 pg   MCHC 32.4 30.0 - 36.0 g/dL   RDW 14.9 11.5 - 15.5 %   Platelets 194 150 - 400 K/uL   nRBC 0.0 0.0 - 0.2 %   Neutrophils Relative % 92 %   Neutro Abs 7.9 (H) 1.7 - 7.7 K/uL   Lymphocytes Relative 3 %   Lymphs Abs 0.3 (L) 0.7 - 4.0 K/uL   Monocytes Relative 4 %   Monocytes Absolute 0.3 0.1 - 1.0 K/uL   Eosinophils Relative 0 %   Eosinophils Absolute 0.0 0.0 - 0.5 K/uL   Basophils Relative 0 %   Basophils Absolute 0.0 0.0 - 0.1 K/uL   Immature Granulocytes 1 %   Abs Immature Granulocytes 0.04 0.00 - 0.07 K/uL    Comment: Performed at [facility], [place] [address], [place], [place] [address]  Comprehensive metabolic panel  Status: Abnormal   Collection Time: 02/11/21  2:37 PM  Result Value Ref Range   Sodium 138 135 - 145 mmol/L   Potassium 5.1 3.5 - 5.1 mmol/L   Chloride 103 98 - 111 mmol/L   CO2 23 22 - 32 mmol/L   Glucose, Bld 220 (H) 70 - 99 mg/dL    Comment: Glucose reference range applies only to samples taken after fasting for at least 8 hours.   BUN 69 (H) 6 - 20 mg/dL   Creatinine, Ser 1.63 (H) 0.44 - 1.00 mg/dL   Calcium 9.5 8.9 - 10.3 mg/dL   Total Protein 8.0 6.5 - 8.1 g/dL   Albumin 4.1 3.5 - 5.0 g/dL   AST 59 (H) 15 - 41 U/L   ALT 56 (H) 0 - 44 U/L   Alkaline Phosphatase 151 (H) 38 - 126 U/L   Total Bilirubin 1.0 0.3 - 1.2 mg/dL   GFR, Estimated 37 (L) >60 mL/min    Comment: (NOTE) Calculated using the CKD-EPI Creatinine Equation (2021)    Anion gap 12 5 - 15    Comment: Performed at [facility], [place]  [address] [place], [place] [address]  Urinalysis, Routine w reflex microscopic     Status: Abnormal   Collection Time: 02/11/21  7:38 PM  Result Value Ref Range   Color, Urine YELLOW YELLOW   APPearance CLEAR CLEAR   Specific Gravity, Urine 1.019 1.005 - 1.030   pH 5.0 5.0 - 8.0   Glucose, UA NEGATIVE NEGATIVE mg/dL   Hgb urine dipstick MODERATE (A) NEGATIVE   Bilirubin Urine NEGATIVE NEGATIVE   Ketones, ur NEGATIVE NEGATIVE mg/dL   Protein, ur NEGATIVE NEGATIVE mg/dL   Nitrite NEGATIVE NEGATIVE   Leukocytes,Ua NEGATIVE NEGATIVE   RBC / HPF 0-5 0 - 5 RBC/hpf   WBC, UA 0-5 0 - 5 WBC/hpf   Bacteria, UA RARE (A) NONE SEEN   Squamous Epithelial / LPF 0-5 0 - 5   Mucus PRESENT    Hyaline Casts, UA PRESENT     Comment: Performed at [facility], [place] [address] [place], [place] [address]   CT HEAD WO CONTRAST  Result Date: 02/11/2021 CLINICAL DATA:  Altered mental status.  History of breast cancer. EXAM: CT HEAD WITHOUT CONTRAST TECHNIQUE: Contiguous axial images were obtained from the base of the skull through the vertex without intravenous contrast. COMPARISON:  CT head dated January 16, 2021. FINDINGS: Brain: Slightly worsened vasogenic edema and cortical thickening in the left posterior parietal lobe. No evidence of acute infarction, hemorrhage, hydrocephalus, or extra-axial collection. Vascular: No hyperdense vessel or unexpected calcification. Skull: Similar small indeterminate subcentimeter lucencies in the medial right frontal bone and left parietal bone. No fracture. Sinuses/Orbits: No acute finding. Other: None. IMPRESSION: 1. 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 2. Similar small indeterminate subcentimeter lucencies in the skull. Consider bone scan for further evaluation. Electronically Signed   By: Titus Dubin M.D.   On: 02/11/2021 16:07   DG  Chest Port 1 View  Result Date: 02/11/2021 CLINICAL DATA:  Weakness, dehydration and altered mental status. EXAM: PORTABLE CHEST 1 VIEW COMPARISON:  02/14/2018. FINDINGS: Normal sized heart. Poor inspiration. Interval mild elevation of the left hemidiaphragm. Clear lungs with normal vascularity. Thoracic spine degenerative changes. IMPRESSION: Interval poor inspiration and mild elevation of the left hemidiaphragm. Electronically Signed   By: Claudie Revering M.D.   On:  02/11/2021 15:30    Pending Labs Unresulted Labs (From admission, onward)          Start     Ordered   02/18/21 0500  Creatinine, serum  (enoxaparin (LOVENOX)    CrCl >/= 30 ml/min)  Weekly,   R     Comments: while on enoxaparin therapy    02/11/21 1743   02/12/21 0500  Comprehensive metabolic panel  Tomorrow morning,   R        02/11/21 1743   02/12/21 0500  CBC  Tomorrow morning,   R        02/11/21 1743   02/12/21 0500  Protime-INR  Tomorrow morning,   R        02/11/21 1743   02/12/21 0500  APTT  Tomorrow morning,   R        02/11/21 1743   02/11/21 1609  SARS CORONAVIRUS 2 (TAT 6-24 HRS) Nasopharyngeal Nasopharyngeal Swab  (Tier 3 - Symptomatic/asymptomatic with Precautions)  Once,   STAT       Question Answer Comment  Is this test for diagnosis or screening Screening   Symptomatic for COVID-19 as defined by CDC No   Hospitalized for COVID-19 No   Admitted to ICU for COVID-19 No   Previously tested for COVID-19 Yes   Resident in a congregate (group) care setting Unknown   Employed in healthcare setting Unknown   Pregnant No   Has patient completed COVID vaccination(s) (2 doses of Pfizer/Moderna 1 dose of [employer]) Unknown      02/11/21 1609   02/11/21 1437  Urine Culture  ONCE - STAT,   STAT        02/11/21 1436          Vitals/Pain Today's Vitals   02/11/21 1700 02/11/21 1759 02/11/21 2020 02/11/21 2049  BP: (!) 181/129 (!) 184/142 (!) 163/107   Pulse: 96 100 95   Resp: 18 19 18    Temp: (!)  97.4 F (36.3 C) 97.7 F (36.5 C) (!) 97.5 F (36.4 C)   TempSrc: Axillary Oral Axillary   SpO2: 100% 100% 98%   Weight:    92 kg  Height:    5\' 4"  (1.626 m)  PainSc:        Isolation Precautions Airborne and Contact precautions  Medications Medications  0.9 %  sodium chloride infusion ( Intravenous New Bag/Given 02/11/21 1752)  aspirin EC tablet 81 mg (has no administration in time range)  atorvastatin (LIPITOR) tablet 80 mg (has no administration in time range)  enoxaparin (LOVENOX) injection 40 mg (has no administration in time range)  docusate sodium (COLACE) capsule 100 mg (has no administration in time range)  senna-docusate (Senokot-S) tablet 1 tablet (has no administration in time range)    Or  bisacodyl (DULCOLAX) suppository 10 mg (has no administration in time range)  ipratropium-albuterol (DUONEB) 0.5-2.5 (3) MG/3ML nebulizer solution 3 mL (has no administration in time range)  insulin aspart (novoLOG) injection 0-9 Units (has no administration in time range)  insulin aspart (novoLOG) injection 0-5 Units (has no administration in time range)  labetalol (NORMODYNE) injection 10 mg (has no administration in time range)  dexamethasone (DECADRON) injection 4 mg (has no administration in time range)  lactated ringers bolus 1,000 mL (0 mLs Intravenous Stopped 02/11/21 1704)  sodium chloride 0.9 % bolus 500 mL (0 mLs Intravenous Stopped 02/11/21 2049)    Mobility non-ambulatory

## 2021-02-11 NOTE — ED Notes (Signed)
Patient transported to CT 

## 2021-02-11 NOTE — ED Notes (Signed)
Patient unable to sign due to AMS. Zacarias Pontes, RN signed MSE and Neita Garnet, RN witnessed

## 2021-02-11 NOTE — H&P (Signed)
History and Physical    Lori Green HQI:[id number] DOB: [birth year] DOA: 02/11/2021  PCP: Darreld Mclean, MD Patient coming from: Home.  Status with boyfriend.  Sister and mother moved in recently.  Chief Complaint: Decreased oral intake  HPI: Lori Green is a 55 y.o. 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 intake, generalized weakness and altered mental status.   Per last radiation oncology note, it seems radiation therapy is stopped 2 treatments before completion as family wanted to discuss about hospice versus continued therapy.  History provided by patient's daughter at bedside and patient's significant other over the phone.  Per daughter, patient has been declining since last hospitalization.  Lately, she has been full assist with walker for mobility.  She has not been eating or drinking.  She answers "yes" to every questions.  She seems to be in pain in her legs.  She has not had bowel movements for days.  No report of fever, cough, shortness of breath, vomiting but nausea.  She spits up phlegm but no projectile vomiting or hematemesis.  They have not met with palliative care before.  Family were told by her oncologist that a cancer is "incurable".  She was given 3 treatment options which includes another round of chemotherapy, oral chemotherapy or hospice".  They were told the chemotherapy is "just to prolong life".  Family has been discussing about has been discussing about hospice and other treatment options.   In ED, markedly elevated DBP to 120s but has been restless and blood pressure has been taken from her legs.  Normal saturation on room air.  Significant labs include WBC 8.5 (3.4 about 2 weeks prior), Hgb 14.4 (9.9 about 2 weeks prior), Cr  1.63 (0.89 about 2 weeks prior), BUN 69, AST 59, ALT 56 and glucose of 220.  CXR without acute finding.  CT head without contrast with a slightly worsened vasogenic edema and cortical thickening in the left posterior parietal lobe, which remains concerning for leptomeningeal metastatic disease and similar small indeterminate subcentimeter lucencies in the skull.  Received a liter of LR bolus and a started on IV LR maintenance.  Hospitalist service called for admission  ROS Was not able to obtain review of system other than what his sister could provide due to patient's mental status.  PMH Past Medical History:  Diagnosis Date  . Asthma exacerbation 10/22/2017  . Bartholin gland cyst   . Fluid excess   . History of diabetes mellitus 01/15/2021  . Hypertension   . Hypertension 01/15/2021  . 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 Menorrhagia   . OSA (obstructive sleep apnea) 01/15/2021  . SVD (spontaneous vaginal delivery)    x 6   PSH Past Surgical History:  Procedure Laterality Date  . ABDOMINAL HYSTERECTOMY    . BILATERAL SALPINGECTOMY Bilateral 07/19/2015   Procedure: BILATERAL SALPINGECTOMY;  Surgeon: Lavonia Drafts, MD;  Location: [facility];  Service: Gynecology;  Laterality: Bilateral;  . BREAST REDUCTION SURGERY  04/2003  . BUNIONECTOMY  06/17/12   lt  . CARPAL TUNNEL RELEASE Left 06/25/2014   Procedure: LEFT CARPAL TUNNEL RELEASE;  Surgeon: Daryll Brod, MD;  Location: [place];  Service: Orthopedics;  Laterality: Left;  . DILATATION & CURETTAGE/HYSTEROSCOPY WITH MYOSURE N/A 05/10/2015   Procedure: DILATATION & CURETTAGE/HYSTEROSCOPY WITH MYOSURE;  Surgeon: Lavonia Drafts, MD;  Location: [facility];  Service: Gynecology;  Laterality: N/A;  . HYSTEROSCOPY WITH NOVASURE N/A 03/01/2014   Procedure: HYSTEROSCOPY WITH NOVASURE;  Surgeon: Lavonia Drafts, MD;  Location: [facility];  Service: Gynecology;  Laterality: N/A;  . MASTECTOMY    . OOPHORECTOMY Right  07/19/2015   Procedure: PARTIAL OOPHORECTOMY;  Surgeon: Lavonia Drafts, MD;  Location: [facility];  Service: Gynecology;  Laterality: Right;  . REDUCTION MAMMAPLASTY    . VAGINAL HYSTERECTOMY N/A 07/19/2015   Procedure: HYSTERECTOMY VAGINAL;  Surgeon: Lavonia Drafts, MD;  Location: [facility];  Service: Gynecology;  Laterality: N/A;  . WISDOM TOOTH EXTRACTION     Fam HX Family History  Problem Relation Age of Onset  . Stroke Father   . Heart disease Father   . Diabetes Son   . Diabetes Son   . Breast cancer Paternal Aunt   . Breast cancer Paternal Grandmother   . Cancer Neg Hx     Social Hx  reports that she has never smoked. She has never used smokeless tobacco. She reports that she does not drink alcohol and does not use drugs.  Allergy Allergies  Allergen Reactions  . Naproxen Nausea And Vomiting  . Penicillins Other (See Comments)    unknown   Home Meds Prior to Admission medications   Medication Sig Start Date End Date Taking? Authorizing Provider  dexamethasone (DECADRON) 4 MG tablet Take 8 mg by mouth 2 (two) times daily. 01/23/21  Yes [provider]  oxyCODONE (OXY IR/ROXICODONE) 5 MG immediate release tablet Take 5 mg by mouth every 6 (six) hours as needed for severe pain. 02/06/21 02/20/21 Yes [provider]  sulfamethoxazole-trimethoprim (BACTRIM DS) 800-160 MG tablet Take 1 tablet by mouth 2 (two) times daily. Start date : 02/06/21 02/01/21  Yes [provider]  albuterol (PROVENTIL) (2.5 MG/3ML) 0.083% nebulizer solution Take 3 mLs (2.5 mg total) by nebulization every 6 (six) hours as needed for wheezing or shortness of breath. Patient not taking: No sig reported 10/24/17   Oswald Hillock, MD  aspirin EC 81 MG EC tablet Take 1 tablet (81 mg total) by mouth daily. Swallow whole. Patient not taking: No sig reported 01/17/21 04/17/21  Dessa Phi, DO  atorvastatin (LIPITOR) 80 MG tablet Take 1 tablet (80 mg total) by mouth daily. Patient  not taking: No sig reported 01/17/21   Dessa Phi, DO  budesonide (PULMICORT) 0.25 MG/2ML nebulizer solution Take 2 mLs (0.25 mg total) by nebulization 2 (two) times daily. Patient not taking: No sig reported 10/24/17   Oswald Hillock, MD  hydrOXYzine (ATARAX/VISTARIL) 10 MG tablet Take 1 tablet (10 mg total) by mouth 3 (three) times daily as needed for anxiety. Patient not taking: No sig reported 12/29/19   Copland, Gay Filler, MD  metFORMIN (GLUCOPHAGE) 500 MG tablet Take 1 tablet (500 mg total) by mouth 2 (two) times daily with a meal. Patient not taking: No sig reported 08/05/19   Copland, Gay Filler, MD  phentermine 15 MG capsule Take 1 capsule (15 mg total) by mouth every morning. Patient not taking: No sig reported 08/28/19   Copland, Gay Filler, MD  rizatriptan (MAXALT) 10 MG tablet Take 1 tablet (10 mg total) by mouth as needed for migraine. May repeat in 2 hours if needed Patient not taking: No sig reported 07/15/17   Copland, Gay Filler, MD    Physical Exam: Vitals:   02/11/21 1426 02/11/21 1616 02/11/21 1636 02/11/21 1700  BP: (!) 155/137  Marland Kitchen)  155/129 (!) 181/129  Pulse: (!) 122 100 99 96  Resp: $Remo'18 18  18  'CRySe$ Temp: 98.3 F (36.8 C)   (!) 97.4 F (36.3 C)  TempSrc: Rectal   Axillary  SpO2: 94% 98% 100% 100%    GENERAL: Somewhat restless. HEENT: MMM.  Vision and hearing grossly intact.  NECK: Supple.  No apparent JVD.  RESP: On RA.  No IWOB. Good air movement bilaterally. CVS:  RRR. Heart sounds normal.  ABD/GI/GU: Bowel sounds present. Soft. Non tender.  MSK/EXT:  Moves extremities. No apparent deformity or edema.  SKIN: no apparent skin lesion or wound NEURO: Awake but not quite alert.  Oriented to self.  Some residual expressive aphasia.  Otherwise, no apparent focal neuro deficit but limited exam due to patient mental status PSYCH: Somewhat restless.  Personally Reviewed Radiological Exams CT HEAD WO CONTRAST  Result Date: 02/11/2021 CLINICAL DATA:  Altered mental status.   History of breast cancer. EXAM: CT HEAD WITHOUT CONTRAST TECHNIQUE: Contiguous axial images were obtained from the base of the skull through the vertex without intravenous contrast. COMPARISON:  CT head dated January 16, 2021. FINDINGS: Brain: Slightly worsened vasogenic edema and cortical thickening in the left posterior parietal lobe. No evidence of acute infarction, hemorrhage, hydrocephalus, or extra-axial collection. Vascular: No hyperdense vessel or unexpected calcification. Skull: Similar small indeterminate subcentimeter lucencies in the medial right frontal bone and left parietal bone. No fracture. Sinuses/Orbits: No acute finding. Other: None. IMPRESSION: 1. 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 2. Similar small indeterminate subcentimeter lucencies in the skull. Consider bone scan for further evaluation. Electronically Signed   By: Titus Dubin M.D.   On: 02/11/2021 16:07   DG Chest Port 1 View  Result Date: 02/11/2021 CLINICAL DATA:  Weakness, dehydration and altered mental status. EXAM: PORTABLE CHEST 1 VIEW COMPARISON:  02/14/2018. FINDINGS: Normal sized heart. Poor inspiration. Interval mild elevation of the left hemidiaphragm. Clear lungs with normal vascularity. Thoracic spine degenerative changes. IMPRESSION: Interval poor inspiration and mild elevation of the left hemidiaphragm. Electronically Signed   By: Claudie Revering M.D.   On: 02/11/2021 15:30     Personally Reviewed Labs: CBC: Recent Labs  Lab 02/11/21 1437  WBC 8.5  NEUTROABS 7.9*  HGB 14.4  HCT 44.5  MCV 86.6  PLT 546   Basic Metabolic Panel: Recent Labs  Lab 02/11/21 1437  NA 138  K 5.1  CL 103  CO2 23  GLUCOSE 220*  BUN 69*  CREATININE 1.63*  CALCIUM 9.5   GFR: CrCl cannot be calculated (Unknown ideal weight.). Liver Function Tests: Recent Labs  Lab 02/11/21 1437  AST 59*  ALT 56*   ALKPHOS 151*  BILITOT 1.0  PROT 8.0  ALBUMIN 4.1   No results for input(s): LIPASE, AMYLASE in the last 168 hours. No results for input(s): AMMONIA in the last 168 hours. Coagulation Profile: No results for input(s): INR, PROTIME in the last 168 hours. Cardiac Enzymes: No results for input(s): CKTOTAL, CKMB, CKMBINDEX, TROPONINI in the last 168 hours. BNP (last 3 results) No results for input(s): PROBNP in the last 8760 hours. HbA1C: No results for input(s): HGBA1C in the last 72 hours. CBG: No results for input(s): GLUCAP in the last 168 hours. Lipid Profile: No results for input(s): CHOL, HDL, LDLCALC, TRIG, CHOLHDL, LDLDIRECT in the last 72 hours. Thyroid Function Tests: No results for input(s): TSH, T4TOTAL, FREET4, T3FREE, THYROIDAB in the last  72 hours. Anemia Panel: No results for input(s): VITAMINB12, FOLATE, FERRITIN, TIBC, IRON, RETICCTPCT in the last 72 hours. Urine analysis:    Component Value Date/Time   COLORURINE YELLOW 06/17/2015 1940   APPEARANCEUR CLEAR 06/17/2015 1940   LABSPEC 1.023 06/17/2015 1940   PHURINE 6.5 06/17/2015 1940   GLUCOSEU NEGATIVE 06/17/2015 1940   HGBUR MODERATE (A) 06/17/2015 1940   BILIRUBINUR NEGATIVE 06/17/2015 1940   KETONESUR NEGATIVE 06/17/2015 1940   PROTEINUR NEGATIVE 06/17/2015 1940   UROBILINOGEN 1.0 06/17/2015 1940   NITRITE NEGATIVE 06/17/2015 1940   LEUKOCYTESUR NEGATIVE 06/17/2015 1940    Sepsis Labs:  None  Personally Reviewed EKG:  Twelve-lead EKG sinus tachycardia with PACs  Assessment/Plan Failure to thrive/dehydration/decreased p.o. intake-as evidenced by hemoconcentration, AKI and azotemia.  Likely due to breast cancer with brain mets. -IV NS at 125 cc an hour -Liberate diet -Encourage oral intake  AKI/azotemia: Likely due to poor p.o. intake and dehydration. Recent Labs    01/14/21 1724 01/15/21 0350 01/16/21 0157 02/11/21 1437  BUN $Re'13 6 9 'gST$ 69*  CREATININE 0.95 0.82 0.89 1.63*  -IV fluid as  above -Avoid nephrotoxic meds -Recheck in the morning -Further work-up if no improvement in the morning   Left breast cancer with possible mets to brain-s/p bilateral mastectomy, adjuvant chemotherapy and radiation therapy.  Per her evaluation oncologist note, it appears she is to treatments short of completion as family wanted to discuss goal of care including hospice.  CT head with slightly worsening vasogenic edema -Continue dexamethasone at 4 mg twice daily -Touch base with her oncologist at [facility] in the morning for better insight into prognosis which seems to be poor -Palliative medicine consult  Hypertensive urgency: SBP as high as 180s.  DBP as high as 140s but not sure if those numbers are accurate as she is restless and BP is being checked on lower extremities due to lymphedema. -Add as needed labetalol  History of asthma: Stable. 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 -Continue aspirin and statin -Optimize blood pressure control  Constipation: -Scheduled Colace -MiraLAX, Senokot-S and Dulcolax as needed  Generalized weakness/debility: Lately full assist with walker for mobility. -PT/OT eval   Goal of care: Patient with breast cancer which seems to have metastasized to brain.  She has residual cancer despite bilateral mastectomy and chemotherapy per her oncologist note.  Prognosis seems to be very grim.  It seems hospice was introduced by a radiation oncologist but family has not decided yet.  She is a still full code.  I discussed about the pros and cons of CPR and intubation and recommended DNR but daughter and significant other prefers to keep her full code pending  further discussion among the rest of the family.  I have offered them a palliative care consult that they agreed to. -Palliative care consulted.   DVT prophylaxis: Subcu Lovenox  Code Status: Full code Family Communication: Updated patient's daughter at bedside, and significant other over the phone  Disposition Plan: Admit to telemetry Consults called: None Admission status: Observation Level of care: Telemetry   Mercy Riding MD [facility]  If 7PM-7AM, please contact night-coverage [email or web address]  02/11/2021, 5:46 PM

## 2021-02-11 NOTE — ED Notes (Signed)
Patient noted to have difficulty following commands. Unable to give oral medications due to patient lack of orientation. Mid-level made aware.

## 2021-02-11 NOTE — ED Notes (Signed)
Patient refused Covid test. Dr. Cyndia Skeeters, Bretta Bang aware.

## 2021-02-11 NOTE — ED Provider Notes (Signed)
[facility] DEPT Provider Note   CSN: [id number] Arrival date & time: 02/11/21  1332     History Chief Complaint  Patient presents with  . Altered Mental Status    Lori Green is a 55 y.o. female.  55 year old female with history of metastatic breast cancer who presents with altered mental status.  Patient is altered at this time the history is per the medical record.  Patient is followed at [facility] and has been deteriorating as of late.  There is talk of hospice care at this time with the family.  They are trying to make a decision.  For the past 3 days, she has had decreased oral intake.  She last received chemotherapy a week ago.  No further history obtainable        Past Medical History:  Diagnosis Date  . Asthma exacerbation 10/22/2017  . Bartholin gland cyst   . Fluid excess   . History of diabetes mellitus 01/15/2021  . Hypertension   . Hypertension 01/15/2021  . 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 Menorrhagia   . OSA (obstructive sleep apnea) 01/15/2021  . SVD (spontaneous vaginal delivery)    x 6    Patient Active Problem List   Diagnosis Date Noted  . DM (diabetes mellitus) ([place]) 01/16/2021  . HLD (hyperlipidemia) 01/16/2021  . History of left breast cancer 01/15/2021  . Migraine 01/15/2021  . History of diabetes mellitus 01/15/2021  . Hypertension 01/15/2021  . OSA (obstructive sleep apnea) 01/15/2021  . 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 Malignant neoplasm of left female breast ([place])   . Acute non intractable tension-type headache 01/14/2021  . Expressive aphasia 01/14/2021  . Neurological deficit present 01/14/2021  . Dizziness 01/14/2021  . Controlled type 2 diabetes mellitus without complication, without long-term current use of insulin ([place]) 08/07/2019  . Asthma exacerbation 10/22/2017  . Migraine without aura and without status migrainosus, not intractable 04/29/2017  . Hemorrhagic ovarian cyst 07/19/2015  .  Postoperative state 07/19/2015  . Hypertension 09/23/2012  . Obesity 09/23/2012  . Dysfunctional uterine bleeding 09/12/2012    Past Surgical History:  Procedure Laterality Date  . ABDOMINAL HYSTERECTOMY    . BILATERAL SALPINGECTOMY Bilateral 07/19/2015   Procedure: BILATERAL SALPINGECTOMY;  Surgeon: Lavonia Drafts, MD;  Location: [facility];  Service: Gynecology;  Laterality: Bilateral;  . BREAST REDUCTION SURGERY  04/2003  . BUNIONECTOMY  06/17/12   lt  . CARPAL TUNNEL RELEASE Left 06/25/2014   Procedure: LEFT CARPAL TUNNEL RELEASE;  Surgeon: Daryll Brod, MD;  Location: [facility];  Service: Orthopedics;  Laterality: Left;  . DILATATION & CURETTAGE/HYSTEROSCOPY WITH MYOSURE N/A 05/10/2015   Procedure: DILATATION & CURETTAGE/HYSTEROSCOPY WITH MYOSURE;  Surgeon: Lavonia Drafts, MD;  Location: [facility];  Service: Gynecology;  Laterality: N/A;  . HYSTEROSCOPY WITH NOVASURE N/A 03/01/2014   Procedure: HYSTEROSCOPY WITH NOVASURE;  Surgeon: Lavonia Drafts, MD;  Location: [facility];  Service: Gynecology;  Laterality: N/A;  . MASTECTOMY    . OOPHORECTOMY Right 07/19/2015   Procedure: PARTIAL OOPHORECTOMY;  Surgeon: Lavonia Drafts, MD;  Location: [facility];  Service: Gynecology;  Laterality: Right;  . REDUCTION MAMMAPLASTY    . VAGINAL HYSTERECTOMY N/A 07/19/2015   Procedure: HYSTERECTOMY VAGINAL;  Surgeon: Lavonia Drafts, MD;  Location: [facility];  Service: Gynecology;  Laterality: N/A;  . WISDOM TOOTH EXTRACTION       OB History    Gravida  6   Para  6   Term  6  Preterm  0   AB  0   Living        SAB  0   IAB  0   Ectopic  0   Multiple      Live Births              Family History  Problem Relation Age of Onset  . Stroke Father   . Heart disease Father   . Diabetes Son   . Diabetes Son   . Breast cancer Paternal Aunt   . Breast cancer Paternal Grandmother   . Cancer Neg Hx     Social History   Tobacco Use  . Smoking  status: Never Smoker  . Smokeless tobacco: Never Used  Vaping Use  . Vaping Use: Never used  Substance Use Topics  . Alcohol use: Never  . Drug use: Never    Home Medications Prior to Admission medications   Medication Sig Start Date End Date Taking? Authorizing Provider  albuterol (PROVENTIL) (2.5 MG/3ML) 0.083% nebulizer solution Take 3 mLs (2.5 mg total) by nebulization every 6 (six) hours as needed for wheezing or shortness of breath. 10/24/17   Oswald Hillock, MD  aspirin EC 81 MG EC tablet Take 1 tablet (81 mg total) by mouth daily. Swallow whole. 01/17/21 04/17/21  Dessa Phi, DO  atorvastatin (LIPITOR) 80 MG tablet Take 1 tablet (80 mg total) by mouth daily. 01/17/21   Dessa Phi, DO  budesonide (PULMICORT) 0.25 MG/2ML nebulizer solution Take 2 mLs (0.25 mg total) by nebulization 2 (two) times daily. 10/24/17   Oswald Hillock, MD  gabapentin (NEURONTIN) 300 MG capsule Take 300 mg by mouth 3 (three) times daily.    [provider]  hydrOXYzine (ATARAX/VISTARIL) 10 MG tablet Take 1 tablet (10 mg total) by mouth 3 (three) times daily as needed for anxiety. 12/29/19   Copland, Gay Filler, MD  metFORMIN (GLUCOPHAGE) 500 MG tablet Take 1 tablet (500 mg total) by mouth 2 (two) times daily with a meal. 08/05/19   Copland, Gay Filler, MD  phentermine 15 MG capsule Take 1 capsule (15 mg total) by mouth every morning. 08/28/19   Copland, Gay Filler, MD  rizatriptan (MAXALT) 10 MG tablet Take 1 tablet (10 mg total) by mouth as needed for migraine. May repeat in 2 hours if needed Patient not taking: Reported on 12/01/2019 07/15/17   Copland, Gay Filler, MD    Allergies    Naproxen  Review of Systems   Review of Systems  Unable to perform ROS: Mental status change    Physical Exam Updated Vital Signs BP (!) 155/137 (BP Location: Right Arm)   Pulse (!) 122   Temp 98.3 F (36.8 C) (Rectal)   Resp 18   LMP 06/10/2015   SpO2 94%   Physical Exam Vitals and nursing note reviewed.   Constitutional:      General: She is not in acute distress.    Appearance: Normal appearance. She is well-developed. She is not toxic-appearing.  HENT:     Head: Normocephalic and atraumatic.  Eyes:     General: Lids are normal.     Conjunctiva/sclera: Conjunctivae normal.     Pupils: Pupils are equal, round, and reactive to light.  Neck:     Thyroid: No thyroid mass.     Trachea: No tracheal deviation.  Cardiovascular:     Rate and Rhythm: Normal rate and regular rhythm.     Heart sounds: Normal heart sounds. No murmur heard. No gallop.  Pulmonary:     Effort: Pulmonary effort is normal. No respiratory distress.     Breath sounds: Normal breath sounds. No stridor. No decreased breath sounds, wheezing, rhonchi or rales.  Abdominal:     General: Bowel sounds are normal. There is no distension.     Palpations: Abdomen is soft.     Tenderness: There is no abdominal tenderness. There is no rebound.  Musculoskeletal:        General: No tenderness. Normal range of motion.     Cervical back: Normal range of motion and neck supple.  Skin:    General: Skin is warm and dry.     Findings: No abrasion or rash.  Neurological:     Mental Status: She is alert. She is disoriented and confused.     GCS: GCS eye subscore is 4. GCS verbal subscore is 5. GCS motor subscore is 6.     Cranial Nerves: No cranial nerve deficit.     Sensory: No sensory deficit.     Comments: Patient withdraws to pain in all 4 extremities  Psychiatric:        Attention and Perception: She is inattentive.        Speech: Speech normal.        Behavior: Behavior normal.     ED Results / Procedures / Treatments   Labs (all labs ordered are listed, but only abnormal results are displayed) Labs Reviewed  URINE CULTURE  CBC WITH DIFFERENTIAL/PLATELET  COMPREHENSIVE METABOLIC PANEL  URINALYSIS, ROUTINE W REFLEX MICROSCOPIC    EKG None  Radiology No results found.  Procedures Procedures   Medications  Ordered in ED Medications  lactated ringers infusion ( Intravenous New Bag/Given 02/11/21 1443)  lactated ringers bolus 1,000 mL (1,000 mLs Intravenous New Bag/Given 02/11/21 1444)    ED Course  I have reviewed the triage vital signs and the nursing notes.  Pertinent labs & imaging results that were available during my care of the patient were reviewed by me and considered in my medical decision making (see chart for details).    MDM Rules/Calculators/A&P                          Patient with evidence of dehydration here with elevated BUN and creatinine.  Given 2 L of lactated Ringer's here.  Chest x-ray without acute findings.  Heart rate was elevated initially and has improved with fluids.  Patient's head CT only demonstrates slightly worsening worsening edema.  Patient has known metastatic disease.  Patient will require admission for IV hydration  CRITICAL CARE Performed by: Leota Jacobsen Total critical care time: 55 minutes Critical care time was exclusive of separately billable procedures and treating other patients. Critical care was necessary to treat or prevent imminent or life-threatening deterioration. Critical care was time spent personally by me on the following activities: development of treatment plan with patient and/or surrogate as well as nursing, discussions with consultants, evaluation of patient's response to treatment, examination of patient, obtaining history from patient or surrogate, ordering and performing treatments and interventions, ordering and review of laboratory studies, ordering and review of radiographic studies, pulse oximetry and re-evaluation of patient's condition.     final Clinical Impression(s) / ED Diagnoses Final diagnoses:  None    Rx / DC Orders ED Discharge Orders    None       Lacretia Leigh, MD 02/11/21 1629

## 2021-02-12 DIAGNOSIS — Z923 Personal history of irradiation: Secondary | ICD-10-CM | POA: Diagnosis not present

## 2021-02-12 DIAGNOSIS — C50919 Malignant neoplasm of unspecified site of unspecified female breast: Secondary | ICD-10-CM | POA: Diagnosis not present

## 2021-02-12 DIAGNOSIS — R531 Weakness: Secondary | ICD-10-CM | POA: Diagnosis not present

## 2021-02-12 DIAGNOSIS — E669 Obesity, unspecified: Secondary | ICD-10-CM | POA: Diagnosis present

## 2021-02-12 DIAGNOSIS — E1165 Type 2 diabetes mellitus with hyperglycemia: Secondary | ICD-10-CM | POA: Diagnosis present

## 2021-02-12 DIAGNOSIS — Z853 Personal history of malignant neoplasm of breast: Secondary | ICD-10-CM | POA: Diagnosis not present

## 2021-02-12 DIAGNOSIS — Z20822 Contact with and (suspected) exposure to covid-19: Secondary | ICD-10-CM | POA: Diagnosis present

## 2021-02-12 DIAGNOSIS — Z8673 Personal history of transient ischemic attack (TIA), and cerebral infarction without residual deficits: Secondary | ICD-10-CM | POA: Diagnosis not present

## 2021-02-12 DIAGNOSIS — N179 Acute kidney failure, unspecified: Secondary | ICD-10-CM | POA: Diagnosis present

## 2021-02-12 DIAGNOSIS — C7931 Secondary malignant neoplasm of brain: Principal | ICD-10-CM

## 2021-02-12 DIAGNOSIS — R5381 Other malaise: Secondary | ICD-10-CM | POA: Diagnosis not present

## 2021-02-12 DIAGNOSIS — E860 Dehydration: Secondary | ICD-10-CM | POA: Diagnosis present

## 2021-02-12 DIAGNOSIS — I160 Hypertensive urgency: Secondary | ICD-10-CM

## 2021-02-12 DIAGNOSIS — G936 Cerebral edema: Secondary | ICD-10-CM | POA: Diagnosis present

## 2021-02-12 DIAGNOSIS — K5900 Constipation, unspecified: Secondary | ICD-10-CM | POA: Diagnosis present

## 2021-02-12 DIAGNOSIS — C7949 Secondary malignant neoplasm of other parts of nervous system: Secondary | ICD-10-CM | POA: Diagnosis not present

## 2021-02-12 DIAGNOSIS — J45909 Unspecified asthma, uncomplicated: Secondary | ICD-10-CM | POA: Diagnosis present

## 2021-02-12 DIAGNOSIS — Z7189 Other specified counseling: Secondary | ICD-10-CM | POA: Diagnosis not present

## 2021-02-12 DIAGNOSIS — G43909 Migraine, unspecified, not intractable, without status migrainosus: Secondary | ICD-10-CM | POA: Diagnosis present

## 2021-02-12 DIAGNOSIS — G4733 Obstructive sleep apnea (adult) (pediatric): Secondary | ICD-10-CM | POA: Diagnosis present

## 2021-02-12 DIAGNOSIS — G9340 Encephalopathy, unspecified: Secondary | ICD-10-CM | POA: Diagnosis present

## 2021-02-12 DIAGNOSIS — R627 Adult failure to thrive: Secondary | ICD-10-CM

## 2021-02-12 DIAGNOSIS — I10 Essential (primary) hypertension: Secondary | ICD-10-CM | POA: Diagnosis present

## 2021-02-12 DIAGNOSIS — R4701 Aphasia: Secondary | ICD-10-CM | POA: Diagnosis present

## 2021-02-12 DIAGNOSIS — R7989 Other specified abnormal findings of blood chemistry: Secondary | ICD-10-CM | POA: Diagnosis present

## 2021-02-12 DIAGNOSIS — G9349 Other encephalopathy: Secondary | ICD-10-CM | POA: Diagnosis present

## 2021-02-12 DIAGNOSIS — Z8249 Family history of ischemic heart disease and other diseases of the circulatory system: Secondary | ICD-10-CM | POA: Diagnosis not present

## 2021-02-12 DIAGNOSIS — R52 Pain, unspecified: Secondary | ICD-10-CM | POA: Diagnosis not present

## 2021-02-12 DIAGNOSIS — Z9013 Acquired absence of bilateral breasts and nipples: Secondary | ICD-10-CM | POA: Diagnosis not present

## 2021-02-12 DIAGNOSIS — Z515 Encounter for palliative care: Secondary | ICD-10-CM | POA: Diagnosis not present

## 2021-02-12 DIAGNOSIS — Z6834 Body mass index (BMI) 34.0-34.9, adult: Secondary | ICD-10-CM | POA: Diagnosis not present

## 2021-02-12 DIAGNOSIS — Z9221 Personal history of antineoplastic chemotherapy: Secondary | ICD-10-CM | POA: Diagnosis not present

## 2021-02-12 DIAGNOSIS — Z823 Family history of stroke: Secondary | ICD-10-CM | POA: Diagnosis not present

## 2021-02-12 LAB — COMPREHENSIVE METABOLIC PANEL
ALT: 67 U/L — ABNORMAL HIGH (ref 0–44)
AST: 65 U/L — ABNORMAL HIGH (ref 15–41)
Albumin: 3.9 g/dL (ref 3.5–5.0)
Alkaline Phosphatase: 146 U/L — ABNORMAL HIGH (ref 38–126)
Anion gap: 9 (ref 5–15)
BUN: 49 mg/dL — ABNORMAL HIGH (ref 6–20)
CO2: 25 mmol/L (ref 22–32)
Calcium: 9.7 mg/dL (ref 8.9–10.3)
Chloride: 105 mmol/L (ref 98–111)
Creatinine, Ser: 1.19 mg/dL — ABNORMAL HIGH (ref 0.44–1.00)
GFR, Estimated: 53 mL/min — ABNORMAL LOW (ref >60)
Glucose, Bld: 153 mg/dL — ABNORMAL HIGH (ref 70–99)
Potassium: 5.2 mmol/L — ABNORMAL HIGH (ref 3.5–5.1)
Sodium: 139 mmol/L (ref 135–145)
Total Bilirubin: 1.2 mg/dL (ref 0.3–1.2)
Total Protein: 7.6 g/dL (ref 6.5–8.1)

## 2021-02-12 LAB — CBC
HCT: 43.3 % (ref 36.0–46.0)
Hemoglobin: 13.8 g/dL (ref 12.0–15.0)
MCH: 27.6 pg (ref 26.0–34.0)
MCHC: 31.9 g/dL (ref 30.0–36.0)
MCV: 86.6 fL (ref 80.0–100.0)
Platelets: 155 10*3/uL (ref 150–400)
RBC: 5 MIL/uL (ref 3.87–5.11)
RDW: 15 % (ref 11.5–15.5)
WBC: 8.1 10*3/uL (ref 4.0–10.5)
nRBC: 0 % (ref 0.0–0.2)

## 2021-02-12 LAB — TSH: TSH: 1.929 u[IU]/mL (ref 0.350–4.500)

## 2021-02-12 LAB — APTT: aPTT: 23 seconds — ABNORMAL LOW (ref 24–36)

## 2021-02-12 LAB — MAGNESIUM: Magnesium: 2.4 mg/dL (ref 1.7–2.4)

## 2021-02-12 LAB — SARS CORONAVIRUS 2 (TAT 6-24 HRS): SARS Coronavirus 2: NEGATIVE

## 2021-02-12 LAB — PROTIME-INR
INR: 1.2 (ref 0.8–1.2)
Prothrombin Time: 15.2 seconds (ref 11.4–15.2)

## 2021-02-12 LAB — VITAMIN B12: Vitamin B-12: 990 pg/mL — ABNORMAL HIGH (ref 180–914)

## 2021-02-12 MED ORDER — MORPHINE SULFATE (PF) 2 MG/ML IV SOLN
1.0000 mg | INTRAVENOUS | Status: DC | PRN
  Administered 2021-02-12 – 2021-02-15 (×17): 1 mg via INTRAVENOUS
  Filled 2021-02-12 (×17): qty 1

## 2021-02-12 MED ORDER — LORAZEPAM 2 MG/ML IJ SOLN
0.5000 mg | Freq: Four times a day (QID) | INTRAMUSCULAR | Status: DC | PRN
  Administered 2021-02-12 – 2021-02-15 (×4): 0.5 mg via INTRAVENOUS
  Filled 2021-02-12 (×4): qty 1

## 2021-02-12 MED ORDER — HYDROXYZINE HCL 50 MG/ML IM SOLN
50.0000 mg | Freq: Once | INTRAMUSCULAR | Status: AC
  Administered 2021-02-12: 50 mg via INTRAMUSCULAR
  Filled 2021-02-12: qty 1

## 2021-02-12 MED ORDER — DEXAMETHASONE SODIUM PHOSPHATE 4 MG/ML IJ SOLN
4.0000 mg | Freq: Four times a day (QID) | INTRAMUSCULAR | Status: DC
  Administered 2021-02-12 – 2021-02-15 (×13): 4 mg via INTRAVENOUS
  Filled 2021-02-12 (×14): qty 1

## 2021-02-12 NOTE — Progress Notes (Signed)
OT Cancellation Note  Patient Details Name: ARICA BEVILACQUA MRN: [id number] DOB: 03-08-1966   Cancelled Treatment:    Reason Eval/Treat Not Completed: Pain limiting ability to participate. RN requests therapy hold today due to pain. Also family will be having meeting in regards to [place]. Will f/u as able.  Aparna Vanderweele L Natthew Marlatt 02/12/2021, 10:08 AM

## 2021-02-12 NOTE — Progress Notes (Addendum)
PROGRESS NOTE  Lori Green DXI:[id number] DOB: 1965/12/29   PCP: Darreld Mclean, MD  Patient is from: Home.  Lives with significant other.  Total assist with walker for ambulation lately.  DOA: 02/11/2021 LOS: 0  Chief complaints: Decreased oral intake  Brief Narrative / Interim history: 56 y.o. 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 intake, generalized weakness and altered mental status.  She was admitted for AKI with azotemia, hypertensive urgency and failure to thrive.  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   Subjective: Seen and examined earlier this morning.  Family including significant other, two sons and a daughter at bedside.  Patient has been refusing fingerstick for CBG monitoring, lab draws and medications.  Has been difficult to draw IV fluid as she has been restless.  She remains confused and not able to engage in conversation.  We had extensive discussion about patient's situation including her poor prognosis based on her current situation and her oncologist note from 02/10/2020.  Significant other and her daughter seems to understand her grim prognosis but her two sons want this to "fight".  Given her physical condition, I am not even sure if she is a candidate for any chemotherapy.  I have also discussed about CODE STATUS and the pros and cons of CPR and intubation.  Again, they prefer to keep her full code.   Objective: Vitals:   02/11/21 1759 02/11/21 2020 02/11/21 2049 02/11/21 2320  BP: (!) 184/142 (!) 163/107  (!) 166/101  Pulse: 100 95  99  Resp: 19 18  20   Temp: 97.7 F (36.5 C) (!) 97.5 F (36.4 C)  98.7 F  (37.1 C)  TempSrc: Oral Axillary    SpO2: 100% 98%  100%  Weight:   92 kg   Height:   5\' 4"  (1.626 m)     Intake/Output Summary (Last 24 hours) at 02/12/2021 1106 Last data filed at 02/12/2021 0400 Gross per 24 hour  Intake 2266.67 ml  Output --  Net 2266.67 ml   Filed Weights   02/11/21 2049  Weight: 92 kg    Examination:  GENERAL: No apparent distress.  Nontoxic. HEENT: MMM.  Vision and hearing grossly intact.  NECK: Supple.  No apparent JVD.  RESP: On RA.  No IWOB.  Fair aeration bilaterally. CVS:  RRR. Heart sounds normal.  ABD/GI/GU: BS+. Abd soft, NTND.  MSK/EXT:  Moves extremities. No apparent deformity. No edema.  SKIN: no apparent skin lesion or wound NEURO: Awake but not quite alert.  Poorly oriented to self.  Seems to have both expressive and receptive aphasia.  Difficulty following commands but no apparent focal neuro deficit. PSYCH: Somewhat restless.  Looks uncomfortable.  Procedures:  None  Microbiology summarized: [id number] PCR pending.  Assessment & Plan: Failure to thrive/dehydration/decreased p.o. intake-as evidenced by hemoconcentration, AKI and azotemia.  Likely due to breast cancer with brain mets with vasogenic edema. 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   Seems to have poor prognosis likely less than 3 months as per her last oncology notes from [facility].  At that time, hospice was brought up  to family. CT head with slightly worsening vasogenic edema -Increase dexamethasone to 4 mg every 6 hours -Palliative medicine consulted  Addendum Discussed with the oncologist on-call at [facility], Dr. Emogene Morgan who is in agreement with increasing dexamethasone as above. He will notify patient's oncologist, Dr. Jeanell Sparrow to call us tomorrow, 4/11  Acute encephalopathy likely due to metastatic breast cancer, dehydration and possibly  azotemia. -Low-dose Ativan and morphine for anxiety and comfort. -Reorientation and delirium precautions.  AKI/azotemia: Likely due to poor p.o. intake and dehydration.  Patient has not been cooperative with lab draws Recent Labs    01/14/21 1724 01/15/21 0350 01/16/21 0157 02/11/21 1437  BUN 13 6 9  69*  CREATININE 0.95 0.82 0.89 1.63*  -Continue IV fluid as above -Avoid nephrotoxic meds -Recheck renal function when able -Further work-up if no improvement in the morning  Hypertensive urgency: BP improved but difficult to gait the right reading as patient is restless -Add as needed labetalol  History of asthma: Stable. 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 -Continue aspirin and statin -Optimize blood pressure control  Constipation: -Scheduled Colace -MiraLAX, Senokot-S and Dulcolax as needed  Generalized weakness/debility: Lately full assist with walker for mobility. -PT/OT eval   Goal of care:  Had extensive discussion with patient's family (significant other, two sons and daughter) about goal of care and CODE STATUS at bedside.  Unfortunately, family has not reached consensus.  Significant other and daughter seems to understand about poor prognosis.  However, her sons likes to "fight" and continue full scope of care.  -Palliative care consultation pending.    Class I obesity Body mass index is 34.81 kg/m.         DVT prophylaxis:  enoxaparin (LOVENOX) injection 40 mg Start: 02/12/21 1000  Code Status: Full code Family Communication: Updated patient's significant other, two sons and daughter at bedside Level of care: Telemetry Status  is: Observation  The patient will require care spanning > 2 midnights and should be moved to inpatient because: Hemodynamically unstable, Altered mental status, IV treatments appropriate due to intensity of illness or inability to take PO and Inpatient level of care appropriate due to severity of illness  Dispo: The patient is from: Home              Anticipated d/c is to: To be determined              Patient currently is not medically stable to d/c.   Difficult to place patient No       Consultants:  Palliative medicine   Sch Meds:  Scheduled Meds: . aspirin EC  81 mg Oral Daily  . atorvastatin  80 mg Oral Daily  . dexamethasone (DECADRON) injection  4 mg Intravenous Q12H  . docusate sodium  100 mg Oral BID  . enoxaparin (LOVENOX) injection  40 mg Subcutaneous Q24H  . insulin aspart  0-5 Units Subcutaneous QHS  . insulin aspart  0-9 Units Subcutaneous TID WC   Continuous Infusions: . sodium chloride 125 mL/hr at 02/11/21 2339   PRN Meds:.senna-docusate **OR** bisacodyl, ipratropium-albuterol, labetalol, LORazepam, morphine injection  Antimicrobials: Anti-infectives (From admission, onward)   None       I have personally reviewed the following labs and images: CBC: Recent  Labs  Lab 02/11/21 1437  WBC 8.5  NEUTROABS 7.9*  HGB 14.4  HCT 44.5  MCV 86.6  PLT 194   BMP &GFR Recent Labs  Lab 02/11/21 1437  NA 138  K 5.1  CL 103  CO2 23  GLUCOSE 220*  BUN 69*  CREATININE 1.63*  CALCIUM 9.5   Estimated Creatinine Clearance: 41.8 mL/min (A) (by C-G formula based on SCr of 1.63 mg/dL (H)). Liver & Pancreas: Recent Labs  Lab 02/11/21 1437  AST 59*  ALT 56*  ALKPHOS 151*  BILITOT 1.0  PROT 8.0  ALBUMIN 4.1   No results for input(s): LIPASE, AMYLASE in the last 168 hours. No results for input(s): AMMONIA in the last 168 hours. Diabetic: No results for input(s): HGBA1C in the last 72 hours. No results for input(s): GLUCAP in the last 168  hours. Cardiac Enzymes: No results for input(s): CKTOTAL, CKMB, CKMBINDEX, TROPONINI in the last 168 hours. No results for input(s): PROBNP in the last 8760 hours. Coagulation Profile: No results for input(s): INR, PROTIME in the last 168 hours. Thyroid Function Tests: No results for input(s): TSH, T4TOTAL, FREET4, T3FREE, THYROIDAB in the last 72 hours. Lipid Profile: No results for input(s): CHOL, HDL, LDLCALC, TRIG, CHOLHDL, LDLDIRECT in the last 72 hours. Anemia Panel: No results for input(s): VITAMINB12, FOLATE, FERRITIN, TIBC, IRON, RETICCTPCT in the last 72 hours. Urine analysis:    Component Value Date/Time   COLORURINE YELLOW 02/11/2021 1938   APPEARANCEUR CLEAR 02/11/2021 1938   LABSPEC 1.019 02/11/2021 1938   PHURINE 5.0 02/11/2021 1938   GLUCOSEU NEGATIVE 02/11/2021 1938   HGBUR MODERATE (A) 02/11/2021 1938   BILIRUBINUR NEGATIVE 02/11/2021 1938   KETONESUR NEGATIVE 02/11/2021 1938   PROTEINUR NEGATIVE 02/11/2021 1938   UROBILINOGEN 1.0 06/17/2015 1940   NITRITE NEGATIVE 02/11/2021 1938   LEUKOCYTESUR NEGATIVE 02/11/2021 1938   Sepsis Labs: Invalid input(s): PROCALCITONIN, [place]  Microbiology: Recent Results (from the past 240 hour(s))  SARS CORONAVIRUS 2 (TAT 6-24 HRS) Nasopharyngeal Nasopharyngeal Swab     Status: None   Collection Time: 02/11/21  9:14 PM   Specimen: Nasopharyngeal Swab  Result Value Ref Range Status   SARS Coronavirus 2 NEGATIVE NEGATIVE Final    Comment: (NOTE) SARS-CoV-2 target nucleic acids are NOT DETECTED.  The SARS-CoV-2 RNA is generally detectable in upper and lower respiratory specimens during the acute phase of infection. Negative results do not preclude SARS-CoV-2 infection, do not rule out co-infections with other pathogens, and should not be used as the sole basis for treatment or other patient management decisions. Negative results must be combined with clinical observations, patient history, and epidemiological  information. The expected result is Negative.  Fact Sheet for Patients: SugarRoll.be  Fact Sheet for Healthcare Providers: https://www.woods-mathews.com/  This test is not yet approved or cleared by the [place] FDA and  has been authorized for detection and/or diagnosis of SARS-CoV-2 by FDA under an Emergency Use Authorization (EUA). This EUA will remain  in effect (meaning this test can be used) for the duration of the COVID-19 declaration under Se ction 564(b)(1) of the Act, 21 U.S.C. section 360bbb-3(b)(1), unless the authorization is terminated or revoked sooner.  Performed at [facility], [place] [address] [place], [place] [address]     Radiology Studies: CT HEAD WO CONTRAST  Result Date: 02/11/2021 CLINICAL DATA:  Altered mental status.  History of breast cancer. EXAM: CT HEAD WITHOUT CONTRAST TECHNIQUE: Contiguous axial images were obtained from the base of the skull through  the vertex without intravenous contrast. COMPARISON:  CT head dated January 16, 2021. FINDINGS: Brain: Slightly worsened vasogenic edema and cortical thickening in the left posterior parietal lobe. No evidence of acute infarction, hemorrhage, hydrocephalus, or extra-axial collection. Vascular: No hyperdense vessel or unexpected calcification. Skull: Similar small indeterminate subcentimeter lucencies in the medial right frontal bone and left parietal bone. No fracture. Sinuses/Orbits: No acute finding. Other: None. IMPRESSION: 1. 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 2. Similar small indeterminate subcentimeter lucencies in the skull. Consider bone scan for further evaluation. Electronically Signed   By: Titus Dubin M.D.   On: 02/11/2021 16:07   DG Chest Port 1 View  Result Date: 02/11/2021 CLINICAL DATA:  Weakness, dehydration and altered  mental status. EXAM: PORTABLE CHEST 1 VIEW COMPARISON:  02/14/2018. FINDINGS: Normal sized heart. Poor inspiration. Interval mild elevation of the left hemidiaphragm. Clear lungs with normal vascularity. Thoracic spine degenerative changes. IMPRESSION: Interval poor inspiration and mild elevation of the left hemidiaphragm. Electronically Signed   By: Claudie Revering M.D.   On: 02/11/2021 15:30      Aubrina Nieman T. [place]  If 7PM-7AM, please contact night-coverage [email or web address] 02/12/2021, 11:06 AM

## 2021-02-12 NOTE — Progress Notes (Signed)
PT Cancellation Note  Patient Details Name: Lori Green MRN: [id number] DOB: July 08, 1966   Cancelled Treatment:    Reason Eval/Treat Not Completed: Medical issues which prohibited therapy. Pt's RN stated she would like [place] to hold off on therapy today. Pt is in pain and not feeling well today. Family is present and they will be having a discussion with the staff on how to proceed with care. Will follow-up tomorrow.   [name] 02/12/2021, 9:58 AM

## 2021-02-13 DIAGNOSIS — Z515 Encounter for palliative care: Secondary | ICD-10-CM

## 2021-02-13 DIAGNOSIS — B370 Candidal stomatitis: Secondary | ICD-10-CM

## 2021-02-13 LAB — GLUCOSE, CAPILLARY
Glucose-Capillary: 180 mg/dL — ABNORMAL HIGH (ref 70–99)
Glucose-Capillary: 159 mg/dL — ABNORMAL HIGH (ref 70–99)
Glucose-Capillary: 161 mg/dL — ABNORMAL HIGH (ref 70–99)

## 2021-02-13 LAB — URINE CULTURE: Culture: 10000 — AB

## 2021-02-13 MED ORDER — FLUCONAZOLE 100MG IVPB
100.0000 mg | INTRAVENOUS | Status: DC
  Administered 2021-02-14: 100 mg via INTRAVENOUS
  Filled 2021-02-13 (×3): qty 50

## 2021-02-13 MED ORDER — LABETALOL HCL 5 MG/ML IV SOLN
5.0000 mg | INTRAVENOUS | Status: DC | PRN
  Administered 2021-02-13 (×2): 5 mg via INTRAVENOUS
  Filled 2021-02-13 (×2): qty 4

## 2021-02-13 MED ORDER — FLUCONAZOLE IN SODIUM CHLORIDE 200-0.9 MG/100ML-% IV SOLN
200.0000 mg | Freq: Once | INTRAVENOUS | Status: AC
  Administered 2021-02-13: 200 mg via INTRAVENOUS
  Filled 2021-02-13: qty 100

## 2021-02-13 NOTE — Progress Notes (Signed)
PT Cancellation Note  Patient Details Name: Lori Green MRN: [id number] DOB: 06-13-1966   Cancelled Treatment:      hold PT until after family meeting. Will continue efforts if warranted.    [facility] 02/13/2021, 9:19 AM

## 2021-02-13 NOTE — Plan of Care (Signed)
  Problem: Education: Goal: Knowledge of General Education information will improve Description: Including pain rating scale, medication(s)/side effects and non-pharmacologic comfort measures 02/13/2021 2305 by Terrence Dupont, RN Outcome: Not Progressing   Problem: Health Behavior/Discharge Planning: Goal: Ability to manage health-related needs will improve 02/13/2021 2305 by Terrence Dupont, RN Outcome: Not Progressing   Problem: Activity: Goal: Risk for activity intolerance will decrease 02/13/2021 2305 by Terrence Dupont, RN Outcome: Not Progressing   Problem: Nutrition: Goal: Adequate nutrition will be maintained 02/13/2021 2305 by Terrence Dupont, RN Outcome: Not Progressing   Problem: Pain Managment: Goal: General experience of comfort will improve 02/13/2021 2305 by Terrence Dupont, RN Outcome: Progressing   Problem: Clinical Measurements: Goal: Ability to maintain clinical measurements within normal limits will improve 02/13/2021 2305 by Terrence Dupont, RN Outcome: Not Progressing

## 2021-02-13 NOTE — Plan of Care (Signed)
  Problem: Clinical Measurements: Goal: Will remain free from infection Outcome: Progressing   Problem: Pain Managment: Goal: General experience of comfort will improve Outcome: Progressing   Problem: Clinical Measurements: Goal: Diagnostic test results will improve Outcome: Not Progressing   Problem: Activity: Goal: Risk for activity intolerance will decrease Outcome: Not Progressing   Problem: Nutrition: Goal: Adequate nutrition will be maintained Outcome: Not Progressing

## 2021-02-13 NOTE — Progress Notes (Signed)
PROGRESS NOTE  Lori Green VHQ:[id number] DOB: 03-Jul-1966   PCP: Darreld Mclean, MD  Patient is from: Home.  Lives with significant other.  Total assist with walker for ambulation lately.  DOA: 02/11/2021 LOS: 1  Chief complaints: Decreased oral intake  Brief Narrative / Interim history: 55 y.o. 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 intake, generalized weakness and altered mental status.  She was admitted for AKI with azotemia, hypertensive urgency and failure to thrive.  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   Subjective: Seen and examined earlier this morning.  Patient's daughter at bedside.  She looks devastated by her mother's condition.  Patient underwent been refusing lab draws and interruptions.  Per daughter, patient complains of pain but not able to localize.  She seems to be resting comfortably after morphine.  Patient requested me to return when patient's significant other is here for further discussion.  Objective: Vitals:   02/12/21 1346 02/12/21 2143 02/12/21 2200 02/13/21 0526  BP: (!) 131/94 104/89  (!) 143/107  Pulse: (!) 115 (!) 125  (!) 102  Resp: 20  18   Temp: 97.9 F (36.6 C) 98.3 F (36.8 C)  97.8 F (36.6 C)  TempSrc: Oral Oral  Oral  SpO2: 99% 100%    Weight:      Height:        Intake/Output Summary (Last 24 hours) at 02/13/2021 1037 Last data filed at 02/12/2021 2219 Gross per 24 hour  Intake 973.33 ml  Output --  Net 973.33 ml   Filed Weights   02/11/21 2049  Weight: 92 kg    Examination:  GENERAL: No apparent distress.  Nontoxic. HEENT: MMM.  Vision and hearing grossly intact.   NECK: Supple.  No apparent JVD.  RESP: On RA.  No IWOB.  Fair aeration bilaterally. CVS:  RRR. Heart sounds normal.  ABD/GI/GU: BS+. Abd soft, NTND.  MSK/EXT:  Moves extremities. No apparent deformity. No edema.  SKIN: no apparent skin lesion or wound NEURO: Sleepy but wakes to voice.  Only oriented to self.  Aphasia.  No other focal neuro deficit. PSYCH: Calm.  No distress or agitation  Procedures:  None  Microbiology summarized: COVID-19 PCR pending.  Assessment & Plan: Failure to thrive/dehydration/decreased p.o. intake-as evidenced by hemoconcentration, AKI and azotemia.  Likely due to breast cancer with brain mets with vasogenic edema. 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   Seems to have poor prognosis likely less than 3 months as per her last oncology notes from [facility].  At that time, hospice was brought up to family. CT head with slightly worsening vasogenic edema -Had a return call from patient's oncologist, Dr. Jeanell Sparrow at [facility]. Dr. Jeanell Sparrow considered chemo when she saw patient on 02/09/2021. However, patient continued to decline with worsening brain edema and metastasis. Dr. Jeanell Sparrow says she is no longer a candidate for chemotherapy and recommended hospice. Dr. Jeanell Sparrow will get in touch with family. Dr. Jeanell Sparrow says she would be happy to be her primary attending if she goes home with home hospice.  -Appreciate help by palliative medicine.   Acute encephalopathy  likely due to metastatic breast cancer, dehydration and possibly azotemia. -Low-dose Ativan and morphine for anxiety and comfort. -Reorientation and delirium precautions.  AKI/azotemia: Likely due to poor p.o. intake and dehydration.  Patient has not been cooperative with lab draws Recent Labs    01/14/21 1724 01/15/21 0350 01/16/21 0157 02/11/21 1437 02/12/21 1044  BUN 13 6 9  69* 49*   CREATININE 0.95 0.82 0.89 1.63* 1.19*  -Continue IV fluid as above -Avoid nephrotoxic meds -Recheck renal function when able -Further work-up if no improvement in the morning  Hypertensive urgency: BP improved but difficult to gait the right reading as patient is restless -Add as needed labetalol  History of asthma: Stable. 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 -Continue aspirin and statin -Optimize blood pressure control  Constipation: -Scheduled Colace -MiraLAX, Senokot-S and Dulcolax as needed  Generalized weakness/debility: Lately full assist with walker for mobility. -PT/OT eval   Goal of care:   02/12/21-had extensive discussion with patient's family (significant other, two sons and daughter) about goal of care and CODE STATUS at bedside.  Unfortunately, family has not reached consensus.  Significant other and daughter seems to understand about poor prognosis.  However, her sons likes to "fight" and continue full scope of care.  02/13/21-I had a return call from patient's oncologist, Dr. Jeanell Sparrow at [facility]. Dr. Jeanell Sparrow considered chemo when she saw patient on 02/09/2021. However, patient continued to decline with worsening brain edema and metastasis. Dr. Jeanell Sparrow says she is no longer a candidate for chemotherapy and recommended hospice. Dr. Jeanell Sparrow will get in touch with family. Dr. Jeanell Sparrow says she would be happy to be her primary attending if she goes home with home hospice.  -Palliative care following-plan for family meeting this afternoon.    Class I obesity Body mass index is 34.81 kg/m.         DVT prophylaxis:  enoxaparin (LOVENOX) injection 40 mg Start: 02/12/21  1000  Code Status: Full code Family Communication: Updated patient's daughter at bedside Level of care: Telemetry Status is: Inpatient  Remains inpatient appropriate because:Ongoing active pain requiring inpatient pain management, Altered mental status, IV treatments appropriate due to intensity of illness or inability to take PO and Inpatient level of care appropriate due to severity of illness   Dispo: The patient is from: Home              Anticipated d/c is to: To be determined              Patient currently is not medically stable to d/c.   Difficult to place patient No           Consultants:  Palliative medicine [facility] oncology team   Sch Meds:  Scheduled Meds: . aspirin EC  81 mg Oral Daily  . atorvastatin  80 mg Oral Daily  . dexamethasone (DECADRON) injection  4 mg Intravenous Q6H  . docusate sodium  100 mg Oral BID  . enoxaparin (LOVENOX) injection  40 mg Subcutaneous Q24H  . insulin aspart  0-5 Units Subcutaneous QHS  . insulin aspart  0-9 Units Subcutaneous TID WC   Continuous Infusions:  PRN Meds:.senna-docusate **OR** bisacodyl, ipratropium-albuterol, labetalol, LORazepam, morphine injection  Antimicrobials: Anti-infectives (From admission, onward)   None  I have personally reviewed the following labs and images: CBC: Recent Labs  Lab 02/11/21 1437 02/12/21 1044  WBC 8.5 8.1  NEUTROABS 7.9*  --   HGB 14.4 13.8  HCT 44.5 43.3  MCV 86.6 86.6  PLT 194 155   BMP &GFR Recent Labs  Lab 02/11/21 1437 02/12/21 1044  NA 138 139  K 5.1 5.2*  CL 103 105  CO2 23 25  GLUCOSE 220* 153*  BUN 69* 49*  CREATININE 1.63* 1.19*  CALCIUM 9.5 9.7  MG  --  2.4   Estimated Creatinine Clearance: 57.3 mL/min (A) (by C-G formula based on SCr of 1.19 mg/dL (H)). Liver & Pancreas: Recent Labs  Lab 02/11/21 1437 02/12/21 1044  AST 59* 65*  ALT 56* 67*  ALKPHOS 151* 146*  BILITOT 1.0 1.2  PROT 8.0 7.6  ALBUMIN 4.1 3.9   No results for  input(s): LIPASE, AMYLASE in the last 168 hours. No results for input(s): AMMONIA in the last 168 hours. Diabetic: No results for input(s): HGBA1C in the last 72 hours. Recent Labs  Lab 02/13/21 0756  GLUCAP 180*   Cardiac Enzymes: No results for input(s): CKTOTAL, CKMB, CKMBINDEX, TROPONINI in the last 168 hours. No results for input(s): PROBNP in the last 8760 hours. Coagulation Profile: Recent Labs  Lab 02/12/21 1044  INR 1.2   Thyroid Function Tests: Recent Labs    02/12/21 1044  TSH 1.929   Lipid Profile: No results for input(s): CHOL, HDL, LDLCALC, TRIG, CHOLHDL, LDLDIRECT in the last 72 hours. Anemia Panel: Recent Labs    02/12/21 1044  VITAMINB12 990*   Urine analysis:    Component Value Date/Time   COLORURINE YELLOW 02/11/2021 1938   APPEARANCEUR CLEAR 02/11/2021 1938   LABSPEC 1.019 02/11/2021 1938   PHURINE 5.0 02/11/2021 1938   GLUCOSEU NEGATIVE 02/11/2021 1938   HGBUR MODERATE (A) 02/11/2021 1938   BILIRUBINUR NEGATIVE 02/11/2021 1938   KETONESUR NEGATIVE 02/11/2021 1938   PROTEINUR NEGATIVE 02/11/2021 1938   UROBILINOGEN 1.0 06/17/2015 1940   NITRITE NEGATIVE 02/11/2021 1938   LEUKOCYTESUR NEGATIVE 02/11/2021 1938   Sepsis Labs: Invalid input(s): PROCALCITONIN, [place]  Microbiology: Recent Results (from the past 240 hour(s))  Urine Culture     Status: Abnormal   Collection Time: 02/11/21  7:38 PM   Specimen: Urine, Clean Catch  Result Value Ref Range Status   Specimen Description   Final    URINE, CLEAN CATCH Performed at [facility], [place] [address] [place], [place] [address]    Special Requests   Final    NONE Performed at [facility], [place] [address] [place], [place] [address]    Culture (A)  Final    10,000 COLONIES/mL MULTIPLE SPECIES PRESENT, SUGGEST RECOLLECTION   Report Status 02/13/2021 FINAL  Final  SARS CORONAVIRUS 2 (TAT 6-24 HRS) Nasopharyngeal Nasopharyngeal Swab     Status:  None   Collection Time: 02/11/21  9:14 PM   Specimen: Nasopharyngeal Swab  Result Value Ref Range Status   SARS Coronavirus 2 NEGATIVE NEGATIVE Final    Comment: (NOTE) SARS-CoV-2 target nucleic acids are NOT DETECTED.  The SARS-CoV-2 RNA is generally detectable in upper and lower respiratory specimens during the acute phase of infection. Negative results do not preclude SARS-CoV-2 infection, do not rule out co-infections with other pathogens, and should not be used as the sole basis for treatment or other patient management decisions. Negative results must be combined with clinical observations, patient history, and epidemiological information. The  expected result is Negative.  Fact Sheet for Patients: SugarRoll.be  Fact Sheet for Healthcare Providers: https://www.woods-mathews.com/  This test is not yet approved or cleared by the [place] FDA and  has been authorized for detection and/or diagnosis of SARS-CoV-2 by FDA under an Emergency Use Authorization (EUA). This EUA will remain  in effect (meaning this test can be used) for the duration of the COVID-19 declaration under Se ction 564(b)(1) of the Act, 21 U.S.C. section 360bbb-3(b)(1), unless the authorization is terminated or revoked sooner.  Performed at [facility], [place] [address], [place],  [address]     Radiology Studies: No results found.    Quinnlan Abruzzo T. [place]  If 7PM-7AM, please contact night-coverage [email or web address] 02/13/2021, 10:37 AM

## 2021-02-13 NOTE — Progress Notes (Signed)
Pt B/p - 156/102 while resting MD notified 5mg -Labetalol PRN  Ordered and administered. Will reassess vitals.

## 2021-02-13 NOTE — Progress Notes (Addendum)
Attempted to reassess Pt's B/P 57mins after administering 5mg  Labetalol pt asleep seemed to be resting comfortably. Rechecked @ 1719 B/p-150/96. Pt and family members has been refusing some aspects of care including PO intake, Lovenox and at times insulin MD aware.

## 2021-02-13 NOTE — Progress Notes (Addendum)
OT Cancellation Note  Patient Details Name: LEELOO SILVERTHORNE MRN: [id number] DOB: 1966-10-24   Cancelled Treatment:    Reason Eval/Treat Not Completed: Patient not medically ready;Patient's level of consciousness;Fatigue/lethargy limiting ability to participate;Pain limiting ability to participate;Other (comment):Spoke with pt's daughter and significant other who inform us that they are having a 2nd family meeting this evening b/t 5-6PM with the MD to determine hospital course. Asking PT/OT to hold off until tomorrow, then check in to see if therapy will proceed or sign off.    Julien Girt 02/13/2021, 11:02 AM

## 2021-02-13 NOTE — Progress Notes (Addendum)
Palliative care brief note  I met with Lori Green and her family.  They are processing her decline and are torn with their thoughts on how to proceed.  Family would like input from Dr. Jeanell Sparrow (her oncologist at [facility]) and Dr, Cyndia Skeeters has already reached out to [facility].  While family understands how sick she is, they also believe that she still has the chance to be healed if that is God's will.  Full code/full scope Await input from Dr. Jeanell Sparrow Plan for f/u meeting tomorrow between 5 and 6 PM. Full consult to follow.  Micheline Rough, MD [facility] [phone]

## 2021-02-13 NOTE — Progress Notes (Signed)
Per Crystal from lab pt refused lab draw. Refused oral meds. I was unable to complete full assessment pt refused to be touched. MD [place] notified.

## 2021-02-14 DIAGNOSIS — R531 Weakness: Secondary | ICD-10-CM

## 2021-02-14 DIAGNOSIS — R52 Pain, unspecified: Secondary | ICD-10-CM

## 2021-02-14 MED ORDER — SENNOSIDES-DOCUSATE SODIUM 8.6-50 MG PO TABS: 1.0000 | ORAL_TABLET | Freq: Two times a day (BID) | ORAL | Status: DC

## 2021-02-14 MED ORDER — CHLORHEXIDINE GLUCONATE CLOTH 2 % EX PADS: 6.0000 | MEDICATED_PAD | Freq: Every day | CUTANEOUS | Status: DC

## 2021-02-14 NOTE — Progress Notes (Signed)
PROGRESS NOTE  CHIRSTY Green VZC:[id number] DOB: 1966/09/14   PCP: Lori Mclean, MD  Patient is from: Home.  Lives with significant other.  Total assist with walker for ambulation lately.  DOA: 02/11/2021 LOS: 2  Chief complaints: Decreased oral intake  Brief Narrative / Interim history: 55 y.o. 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 intake, generalized weakness and altered mental status.  She was admitted for AKI with azotemia, hypertensive urgency and failure to thrive.  CT head with slightly worsened vasogenic edema and cortical thickening in the left posterior parietal lobe concerning for leptomeningeal metastatic disease.  Discussed with patient's oncologist, Dr. Jeanell Green at [facility].  Per Dr. Jeanell Green, patient is no longer a candidate for cancer treatment, and recommended hospice.  Palliative medicine made to his patient and patient's family.  Plan is to discharge home with home hospice when able hospice is ready.  TOC consulted.  Subjective: Seen and examined earlier this morning.  Patient's daughter at the bedside, and concerned about breast expander causing her pain, and asking if it can be taken out.  Breast expander was placed at [facility].  She is also concerned about possible UTI.  She thinks she will have difficulty voiding.   Objective: Vitals:   02/13/21 0526 02/13/21 1430 02/13/21 1719 02/13/21 1952  BP: (!) 143/107 (!) 156/102 (!) 150/96 (!) 190/92  Pulse: (!) 102 (!) 106 91 98  Resp:  20    Temp: 97.8 F (36.6 C) 98.1 F (36.7 C)  98.4 F (36.9 C)  TempSrc: Oral Oral  Oral  SpO2:  98%  97%  Weight:      Height:        Intake/Output Summary (Last 24 hours) at 02/14/2021 1151 Last data filed at 02/14/2021 0631 Gross per 24 hour  Intake 100 ml   Output 450 ml  Net -350 ml   Filed Weights   02/11/21 2049  Weight: 92 kg    Examination:  GENERAL: Resting.  No apparent distress.  Nontoxic. RESP: On RA.  No IWOB.  CVS: Hemodynamically stable. MSK/EXT:  Moves extremities. No apparent deformity. No edema.  SKIN: no apparent skin lesion or wound NEURO: Sleepy.  No apparent focal neuro deficit other than known aphasia. PSYCH: Calm.  No distress or agitation.  Limited visual exam as patient's daughter requested minimal interruption  Procedures:  None  Microbiology summarized: COVID-19 PCR pending.  Assessment & Plan: Failure to thrive/dehydration/decreased p.o. intake-as evidenced by hemoconcentration, AKI and azotemia.  Likely due to breast cancer with brain mets with vasogenic edema. 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   Seems to have poor prognosis likely less than 3 months as per her last oncology notes from [facility].  At that time, hospice was brought up to family. CT head with slightly worsening vasogenic edema -Had a return call from patient's oncologist, Dr. Jeanell Green at [facility] Dr. Jeanell Green considered chemo when she saw patient on 02/09/2021. However, patient continued to decline with worsening brain edema and metastasis. Dr. Jeanell Green says she is no longer a candidate for chemotherapy and recommended hospice. Dr. Jeanell Green will get in touch with family. Dr. Jeanell Green says she would be happy to be her primary attending  if she goes home with home hospice.  -Appreciate help by palliative medicine  -minimize interruption and nonessential meds and tests  -Discharge home with home hospice once hospice ready -Optimize pain control   Acute encephalopathy likely due to metastatic breast cancer, dehydration and possibly azotemia. -Low-dose Ativan and morphine for anxiety and comfort. -Reorientation and delirium  precautions.  AKI/azotemia: Likely due to poor p.o. intake and dehydration.  Patient has not been cooperative with lab draws Recent Labs    01/14/21 1724 01/15/21 0350 01/16/21 0157 02/11/21 1437 02/12/21 1044  BUN 13 6 9  69* 49*  CREATININE 0.95 0.82 0.89 1.63* 1.19*  -Continue IV fluid as above -Avoid nephrotoxic meds  Hypertensive urgency: BP improved but difficult to gait the right reading as patient is restless -Add as needed labetalol  History of asthma: Stable. 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 -Emphasis on comfort  Constipation: -Scheduled Colace with MiraLAX, Senokot-S and Dulcolax as needed  Generalized weakness/debility: Lately full assist with walker for mobility. -Discontinued PT/OT as emphasis is on comfort at this time  Concern about the difficulty urinating: Most likely from opiates. -Recommended intermittent bladder scan and treating constipation  Concern about pain due to breast expander-unfortunately, we have to manage this with pain medication.  Patient is not a candidate for surgical removal of breast expander.    Goal of care counseling/discussion:   02/12/21-had extensive discussion with patient's family (significant other, two sons and daughter) about goal of care and CODE STATUS at bedside.  Unfortunately, family has not reached consensus.  Significant other and daughter seems to understand about poor prognosis.  However, her sons likes to "fight" and continue full scope of care.   02/13/21-I had a return call from patient's oncologist, Dr. Jeanell Green at [facility]. Dr. Jeanell Green considered chemo when she saw patient on 02/09/2021. However, patient  continued to decline with worsening brain edema and metastasis. Dr. Jeanell Green says she is no longer a candidate for chemotherapy and recommended hospice. Dr. Jeanell Green will get in touch with family. Dr. Jeanell Green says she would be happy to be her primary attending if she goes home with home hospice.  -Palliative care following-plan for family meeting this afternoon.  02/14/2021-emphasis on comfort until discharged home with home hospice.  Patient is a still full code.  TOC working on transition.    Class I obesity Body mass index is 34.81 kg/m.         DVT prophylaxis:    Code Status: Full code Family Communication: Updated patient's daughter at bedside Level of care: Telemetry Status is: Inpatient  Remains inpatient appropriate because:Unsafe d/c plan   Dispo: The patient is from: Home              Anticipated d/c is to: Home with home hospice              Patient currently is medically stable to d/c.   Difficult to place patient No           Consultants:  Palliative medicine [facility] oncology team   Sch Meds:  Scheduled Meds: . aspirin EC  81 mg Oral Daily  . atorvastatin  80 mg Oral Daily  . dexamethasone (DECADRON) injection  4 mg Intravenous Q6H  . docusate sodium  100 mg Oral BID  . senna-docusate  1 tablet Oral BID   Continuous Infusions: . fluconazole (DIFLUCAN) IV     PRN Meds:.ipratropium-albuterol, labetalol, LORazepam, morphine injection  Antimicrobials: Anti-infectives (From admission, onward)   Start     Dose/Rate Route Frequency Ordered Stop   02/14/21 2000  fluconazole (DIFLUCAN) IVPB 100 mg        100 mg 50 mL/hr over 60 Minutes Intravenous Every 24 hours 02/13/21 1852     02/13/21 2000  fluconazole (DIFLUCAN) IVPB 200 mg        200 mg 100 mL/hr over 60 Minutes Intravenous  Once 02/13/21 1852 02/14/21 0402       I have personally reviewed the following labs and images: CBC: Recent Labs  Lab 02/11/21 1437 02/12/21 1044  WBC 8.5 8.1  NEUTROABS 7.9*   --   HGB 14.4 13.8  HCT 44.5 43.3  MCV 86.6 86.6  PLT 194 155   BMP &GFR Recent Labs  Lab 02/11/21 1437 02/12/21 1044  NA 138 139  K 5.1 5.2*  CL 103 105  CO2 23 25  GLUCOSE 220* 153*  BUN 69* 49*  CREATININE 1.63* 1.19*  CALCIUM 9.5 9.7  MG  --  2.4   Estimated Creatinine Clearance: 57.3 mL/min (A) (by C-G formula based on SCr of 1.19 mg/dL (H)). Liver & Pancreas: Recent Labs  Lab 02/11/21 1437 02/12/21 1044  AST 59* 65*  ALT 56* 67*  ALKPHOS 151* 146*  BILITOT 1.0 1.2  PROT 8.0 7.6  ALBUMIN 4.1 3.9   No results for input(s): LIPASE, AMYLASE in the last 168 hours. No results for input(s): AMMONIA in the last 168 hours. Diabetic: No results for input(s): HGBA1C in the last 72 hours. Recent Labs  Lab 02/13/21 0756 02/13/21 1724 02/13/21 1942  GLUCAP 180* 159* 161*   Cardiac Enzymes: No results for input(s): CKTOTAL, CKMB, CKMBINDEX, TROPONINI in the last 168 hours. No results for input(s): PROBNP in the last 8760 hours. Coagulation Profile: Recent Labs  Lab 02/12/21 1044  INR 1.2   Thyroid Function Tests: Recent Labs    02/12/21 1044  TSH 1.929   Lipid Profile: No results for input(s): CHOL, HDL, LDLCALC, TRIG, CHOLHDL, LDLDIRECT in the last 72 hours. Anemia Panel: Recent Labs    02/12/21 1044  VITAMINB12 990*   Urine analysis:    Component Value Date/Time   COLORURINE YELLOW 02/11/2021 1938   APPEARANCEUR CLEAR 02/11/2021 1938   LABSPEC 1.019 02/11/2021 1938   PHURINE 5.0 02/11/2021 1938   GLUCOSEU NEGATIVE 02/11/2021 1938   HGBUR MODERATE (A) 02/11/2021 1938   BILIRUBINUR NEGATIVE 02/11/2021 1938   KETONESUR NEGATIVE 02/11/2021 1938   PROTEINUR NEGATIVE 02/11/2021 1938   UROBILINOGEN 1.0 06/17/2015 1940   NITRITE NEGATIVE 02/11/2021 1938   LEUKOCYTESUR NEGATIVE 02/11/2021 1938   Sepsis Labs: Invalid input(s): PROCALCITONIN, [place]  Microbiology: Recent Results (from the past 240 hour(s))  Urine Culture     Status:  Abnormal   Collection Time: 02/11/21  7:38 PM   Specimen: Urine, Clean Catch  Result Value Ref Range Status   Specimen Description   Final    URINE, CLEAN CATCH Performed at [facility], [place] [address] [place], [place] [address]    Special Requests   Final    NONE Performed at [facility], [place] [address] [place], [place] [address]    Culture (A)  Final    10,000 COLONIES/mL MULTIPLE SPECIES PRESENT, SUGGEST RECOLLECTION   Report Status  02/13/2021 FINAL  Final  SARS CORONAVIRUS 2 (TAT 6-24 HRS) Nasopharyngeal Nasopharyngeal Swab     Status: None   Collection Time: 02/11/21  9:14 PM   Specimen: Nasopharyngeal Swab  Result Value Ref Range Status   SARS Coronavirus 2 NEGATIVE NEGATIVE Final    Comment: (NOTE) SARS-CoV-2 target nucleic acids are NOT DETECTED.  The SARS-CoV-2 RNA is generally detectable in upper and lower respiratory specimens during the acute phase of infection. Negative results do not preclude SARS-CoV-2 infection, do not rule out co-infections with other pathogens, and should not be used as the sole basis for treatment or other patient management decisions. Negative results must be combined with clinical observations, patient history, and epidemiological information. The expected result is Negative.  Fact Sheet for Patients: SugarRoll.be  Fact Sheet for Healthcare Providers: https://www.woods-mathews.com/  This test is not yet approved or cleared by the [place] FDA and  has been authorized for detection and/or diagnosis of SARS-CoV-2 by FDA under an Emergency Use Authorization (EUA). This EUA will remain  in effect (meaning this test can be used) for the duration of the COVID-19 declaration under Se ction 564(b)(1) of the Act, 21 U.S.C. section 360bbb-3(b)(1), unless the authorization is terminated or revoked sooner.  Performed at [facility], [place] [address] [place], [place] [address]     Radiology Studies: No results found.    Ashyia Schraeder T. Whitten  If 7PM-7AM, please contact night-coverage [email or web address] 02/14/2021, 11:51 AM

## 2021-02-14 NOTE — Progress Notes (Signed)
Daily Progress Note   Patient Name: Lori Green       Date: 02/14/2021 DOB: 1966/06/16  Age: 55 y.o. MRN#: [id number] Attending Physician: Mercy Riding, MD Primary Care Physician: Darreld Mclean, MD Admit Date: 02/11/2021  Reason for Consultation/Follow-up: Establishing goals of care  Subjective: Patient is resting in bed, lying in prone position.  Daughter present at bedside.  Patient did not verbalize, daughter states that the patient has not had a bowel movement in several days, she has had ongoing at least moderate degree of severity of generalized discomfort.  Length of Stay: 2  Current Medications: Scheduled Meds:  . aspirin EC  81 mg Oral Daily  . atorvastatin  80 mg Oral Daily  . dexamethasone (DECADRON) injection  4 mg Intravenous Q6H  . docusate sodium  100 mg Oral BID  . senna-docusate  1 tablet Oral BID    Continuous Infusions: . fluconazole (DIFLUCAN) IV      PRN Meds: ipratropium-albuterol, labetalol, LORazepam, morphine injection  Physical Exam         Patient lying in prone position does not verbalize does not engage or wish to interact Noted to have regular work of breathing Noted to not have edema Noted to have muscle wasting  Vital Signs: BP (!) 190/92 (BP Location: Right Arm) Comment: 5mg  Labatelol given @ 2005  Pulse 98   Temp 98.4 F (36.9 C) (Oral)   Resp 20   Ht 5\' 4"  (1.626 m)   Wt 92 kg   LMP 06/10/2015   SpO2 97%   BMI 34.81 kg/m  SpO2: SpO2: 97 % O2 Device: O2 Device: Room Air O2 Flow Rate:    Intake/output summary:   Intake/Output Summary (Last 24 hours) at 02/14/2021 0959 Last data filed at 02/14/2021 0631 Gross per 24 hour  Intake 100 ml  Output 450 ml  Net -350 ml   LBM: Last BM Date:  (uta) Baseline Weight: Weight: 92  kg Most recent weight: Weight: 92 kg       Palliative Assessment/Data: 30%   Flowsheet Rows   Flowsheet Row Most Recent Value  Intake Tab   Referral Department Hospitalist  Unit at Time of Referral Med/Surg Unit  Palliative Care Primary Diagnosis Cancer  Date Notified 02/12/21  Palliative Care Type New Palliative care  Reason for  referral Clarify Goals of Care  Date of Admission 02/11/21  Date first seen by Palliative Care 02/13/21  # of days Palliative referral response time 1 Day(s)  # of days IP prior to Palliative referral 1  Clinical Assessment   Psychosocial & Spiritual Assessment   Palliative Care Outcomes       Patient Active Problem List   Diagnosis Date Noted  . Acute encephalopathy 02/11/2021  . DM (diabetes mellitus) ([place]) 01/16/2021  . HLD (hyperlipidemia) 01/16/2021  . History of left breast cancer 01/15/2021  . Migraine 01/15/2021  . History of diabetes mellitus 01/15/2021  . Hypertension 01/15/2021  . OSA (obstructive sleep apnea) 01/15/2021  . 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 Malignant neoplasm of left female breast ([place])   . Acute non intractable tension-type headache 01/14/2021  . Expressive aphasia 01/14/2021  . Neurological deficit present 01/14/2021  . Dizziness 01/14/2021  . Controlled type 2 diabetes mellitus without complication, without long-term current use of insulin ([place]) 08/07/2019  . Asthma exacerbation 10/22/2017  . Migraine without aura and without status migrainosus, not intractable 04/29/2017  . Hemorrhagic ovarian cyst 07/19/2015  . Postoperative state 07/19/2015  . Hypertension 09/23/2012  . Obesity 09/23/2012  . Dysfunctional uterine bleeding 09/12/2012    Palliative Care Assessment & Plan   Patient Profile:    Assessment: 56 year old lady with residual left breast cancer, status post bilateral mastectomy and chemotherapy, leptomeningeal disease Asthma, hypertension, obstructive sleep apnea Admitted with acute  kidney injury azotemia hypertensive urgency failure to thrive CT scan showing worsening vasogenic edema and concern for leptomeningeal metastatic disease  Recommendations/Plan:  Goals of care discussions with patient's daughter who is present at the bedside.  They are awaiting hospice referral.  Plan is to go home with hospice.  To remain full code for now.  Daughter present at the bedside is the patient's fourth child, she has 6 children.  Daughter states that she has to go back to [place] for work but will be back in 5 days.  She is tearful at the bedside and states that she wants to be back as soon as possible so that she can be with her mother.  She realizes the serious nature of the patient's current condition.  Recommend home with hospice support.  Augment bowel regimen.  Continue current pain and on pain symptom regimen.  Offered active listening supportive care and compassionate presence to patient and daughter present at the bedside.   Code Status:    Code Status Orders  (From admission, onward)         Start     Ordered   02/11/21 1742  Full code  Continuous        02/11/21 1743        Code Status History    Date Active Date Inactive Code Status Order ID Comments User Context   01/15/2021 0346 01/16/2021 2258 Full Code [id number]  Vianne Bulls, MD Inpatient   10/23/2017 0115 10/24/2017 1546 Full Code [id number]  Rise Patience, MD Inpatient   07/19/2015 1137 07/20/2015 1806 Full Code [id number]  Lavonia Drafts, MD Inpatient   Advance Care Planning Activity       Prognosis:   < 4 weeks  Discharge Planning:  Home with Hospice  Care plan was discussed with daughter who is present at the bedside.  Thank you for allowing the Palliative Medicine Team to assist in the care of this patient.   Time In:  9 Time Out: 9.35 Total Time  35 Prolonged Time Billed  no       Greater than 50%  of this time was spent counseling and coordinating care related to the above  assessment and plan.  Loistine Chance, MD  Please contact Palliative Medicine Team phone at [phone] for questions and concerns.

## 2021-02-14 NOTE — TOC Initial Note (Signed)
Transition of Care [facility]) - Initial/Assessment Note    Patient Details  Name: Lori Green MRN: [id number] Date of Birth: 02/13/1966  Transition of Care (TOC) CM/SW Contact:    Joaquin Courts, RN Phone Number: 02/14/2021, 10:13 AM  Clinical Narrative:                 Referral given to [place] for hospice services at home.    Expected Discharge Plan: Home w Hospice Care Barriers to Discharge: No Barriers Identified   Patient Goals and CMS Choice Patient states their goals for this hospitalization and ongoing recovery are:: to go home CMS Medicare.gov Compare Post Acute Care list provided to:: Patient Represenative (must comment) Choice offered to / list presented to : Adult Children  Expected Discharge Plan and Services Expected Discharge Plan: Home w Hospice Care   Discharge Planning Services: CM Consult Post Acute Care Choice: Hospice Living arrangements for the past 2 months: Single Family Home                                      Prior Living Arrangements/Services Living arrangements for the past 2 months: Single Family Home   Patient language and need for interpreter reviewed:: Yes Do you feel safe going back to the place where you live?: Yes      Need for Family Participation in Patient Care: Yes (Comment) Care giver support system in place?: Yes (comment)   Criminal Activity/Legal Involvement Pertinent to Current Situation/Hospitalization: No - Comment as needed  Activities of Daily Living Home Assistive Devices/Equipment: None ADL Screening (condition at time of admission) Patient's cognitive ability adequate to safely complete daily activities?: No Is the patient deaf or have difficulty hearing?: No Does the patient have difficulty seeing, even when wearing glasses/contacts?: No Does the patient have difficulty concentrating, remembering, or making decisions?: Yes Patient able to express need for assistance with ADLs?: Yes Does  the patient have difficulty dressing or bathing?: No Independently performs ADLs?: Yes (appropriate for developmental age) Does the patient have difficulty walking or climbing stairs?: No Weakness of Legs: None Weakness of Arms/Hands: None  Permission Sought/Granted                  Emotional Assessment Appearance:: Appears stated age Attitude/Demeanor/Rapport: Engaged       Psych Involvement: No (comment)  Admission diagnosis:  Dehydration [E86.0] Acute encephalopathy [G93.40] Patient Active Problem List   Diagnosis Date Noted  . Acute encephalopathy 02/11/2021  . DM (diabetes mellitus) ([place]) 01/16/2021  . HLD (hyperlipidemia) 01/16/2021  . History of left breast cancer 01/15/2021  . Migraine 01/15/2021  . History of diabetes mellitus 01/15/2021  . Hypertension 01/15/2021  . OSA (obstructive sleep apnea) 01/15/2021  . 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 Malignant neoplasm of left female breast ([place])   . Acute non intractable tension-type headache 01/14/2021  . Expressive aphasia 01/14/2021  . Neurological deficit present 01/14/2021  . Dizziness 01/14/2021  . Controlled type 2 diabetes mellitus without complication, without long-term current use of insulin ([place]) 08/07/2019  . Asthma exacerbation 10/22/2017  . Migraine without aura and without status migrainosus, not intractable 04/29/2017  . Hemorrhagic ovarian cyst 07/19/2015  . Postoperative state 07/19/2015  . Hypertension 09/23/2012  . Obesity 09/23/2012  . Dysfunctional uterine bleeding 09/12/2012   PCP:  Darreld Mclean, MD Pharmacy:   [facility] [place], [place] -  [address] [place] [address] Phone: [phone] Fax: [phone]     Social Determinants of Health (SDOH) Interventions    Readmission Risk Interventions No flowsheet data found.

## 2021-02-14 NOTE — Progress Notes (Signed)
OT Cancellation Note  Patient Details Name: Lori Green MRN: [id number] DOB: [birth year]   Cancelled Treatment:    Reason Eval/Treat Not Completed: OT screened, no needs identified, will sign off. Per palliative note "Hospice support will be the best support for her at home." Family/patient wishing for measures to make patient more comfortable. Will discontinue acute OT order, please re-order if new needs arise.  Delbert Phenix OT OT pager: [place] 02/14/2021, 6:42 AM

## 2021-02-14 NOTE — Progress Notes (Addendum)
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  She is lying in bed and is not in distress if she is not moved or disturbed.  Family reports that they have been noticing that she is not eating or drinking much but she has thick white plaques in her mouth.  I attempted on 3 occasions to examine her, but she moans and refuses to open her mouth or otherwise participate in exam.  We discussed clinical course as well as wishes moving forward in light of her clinical worsening and incurable disease.  Concepts specific to code status and care plan this hospitalization discussed.  We discussed difference between a aggressive medical intervention path and a palliative, comfort focused care path.  Values and goals of care important to patient and family were attempted to be elicited.  Concept of Hospice and Palliative Care were discussed.  Questions and concerns addressed.   PMT will continue to support holistically.  Overall, her family is at different levels of acceptance for her illness and the fact that she is quickly approaching end-of-life.  Some remain hopeful that she will improve and do not want to "give up" on her.  At the same time, they are all advocating for Lori Green and her entire family shows a great deal of love and they are trying to figure out how to best support her moving forward.  Following our discussion we were able to agree that working to get out of the hospital and be home with the support of hospice would be the best way to serve her moving forward.  Recommendations/summary: - Full Code/Full Scope - She is not a candidate for further disease modifying therapy - She is refusing interventions and does not want to be bothered - She is dying regardless of interventions and would like to be home -  Hospice support will be the best support for her at home to feel as well as she can for whatever time she has left - Family has experience in the past with [facility] (when they were hospice and palliative care of [facility]) and would like to work with them moving forward - I am going to minimize current interventions that are causing her pain or distress, such as CBG and lab draws - She appears to have whitish plaques in her mouth on a very limited exam as she would not cooperate and open her mouth or stick out her tongue.  I am going to start her on treatment for thrush today. - I placed a referral for transition of care team to make referral to [facility] for home hospice services.  Family would like to work to meet with them tomorrow to discuss home hospice. - We talked again about CODE STATUS.  There are some members of the family are still resistant to consider anything other than full code.  We discussed the burdens and low chance of benefit.  They expressed understanding and we discussed that this is something that would need to be reconsidered when she transitions home.  Start time: 1730 End time: 1820 Total time: 50 minutes  Greater than 50%  of this time was spent counseling and coordinating care related to the above assessment and plan.  Micheline Rough, MD [place] Team [phone]

## 2021-02-14 NOTE — Progress Notes (Signed)
Manufacturing engineer [facility])  Received request from [facility] for hospice services at home after discharge.  Chart and pt information has been reviewed by [facility] physician.  Hospice eligibility confirmed.  Hospital liaison spoke with son Darral Dash by phone and at bedside with Beverlyn Roux and daughter to initiate education related to hospice philosophy and services and to answer any questions at this time.  All     verbalized understanding of information given.  Per discussion the plan is to discharge home once DME is in place.    Please provide prescriptions at discharge as needed to ensure ongoing symptom management until pt can be admitted onto hospice services.    DME needs discussed. Hospital bed and overbed table ordered for delivery today.   verified and is correct in the chart.  ACC information and contact numbers given to [facility].  Above information shared with Dianna, [facility] Manager.  Please call with any questions or concerns.  Thank you for the opportunity to participate in this pt's care.  Domenic Moras, BSN, RN [facility] [phone] (24h on call)

## 2021-02-14 NOTE — Consult Note (Signed)
Consultation Note Date: 02/14/2021   Patient Name: Lori Green  DOB: November 17, 1965  MRN: [id number]  Age / Sex: 55 y.o., female  PCP: Copland, Gay Filler, MD Referring Physician: Mercy Riding, MD  Reason for Consultation: Establishing goals of care  HPI/Patient Profile: 55 y.o. 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  She has been having worsening aphasia.  She is currently being treated for failure to thrive with dehydration and AKI.   Clinical Assessment and Goals of Care: I met today with Lori Green, including her significant other and multiple children.  We discussed things that are most important to Lori Green.  Her family reports that her family, particularly her grandchildren, are the thing that are most important to her.  She has 6 children and "almost 20" grandchildren.  Her faith is also very important to her.  She enjoys shopping.  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  I introduced palliative care as specialized medical care for people living with serious illness. It focuses on providing relief from the symptoms and stress of a serious illness. The goal is to improve quality of life for both the patient and the family.  We discussed clinical course as well as wishes moving forward in regard to care plan in light of her metastatic disease and continued functional decline.  We discussed the changes they have been noticing in her nutrition, cognition, and functional status.  We also discussed how surprised they were to find out that she had metastatic disease and the frustration around this.   We talked about options for care moving forward including the differences between aggressive medical intervention path and a palliative, comfort focused care path.    While her  family can see the obvious decline that she has been suffering, there continue to be differences in thoughts of how aggressive care which should be providing as family tries to figure out how to best care for her moving forward.  Family is all very focused on giving her the best care possible and remain pleasant and focused on coming up with best plan possible throughout the encounter.  Family has a great deal of faith and remain hopeful that she will have healing through God's intervention.  We discussed this and how ultimately this is up to God in His wisdom to decide and that we will continue to do her best to care for her in light of her physical decline and likelihood that she will continue to worsen over the next few days to weeks.  Concept of Hospice and Palliative Care were discussed  Questions and concerns addressed.   PMT will continue to support holistically.  SUMMARY OF RECOMMENDATIONS   - Family would like to have input from her oncologist, Dr. Jeanell Sparrow, at [facility] prior to making further decisions about goals of care. - It is very clear that all of her family want what is best for  Lori Green and they are trying to determine how to best care for her in light of her progressive illness.  Some expressed concern that we are "giving up" if we do not continue with aggressive interventions.  They were open to hearing concerns that this is going to continue to worsen regardless of interventions and we may be at a point where focusing on making sure she feels as well as she can is the best way to care for her moving forward. - We will plan for follow-up meeting tomorrow between 5 and 6 PM  Code Status/Advance Care Planning:  Full code  Palliative Prophylaxis:   Frequent Pain Assessment  Additional Recommendations (Limitations, Scope, Preferences):  Full Scope Treatment  Psycho-social/Spiritual:  Desire for further Chaplaincy support: Did not address today  Additional Recommendations:  Caregiving  Support/Resources and Education on Hospice  Prognosis:   Weeks  Discharge Planning: To Be Determined      Primary Diagnoses: Present on Admission: . Acute encephalopathy   I have reviewed the medical record, interviewed the patient and family, and examined the patient. The following aspects are pertinent.  Past Medical History:  Diagnosis Date  . Asthma exacerbation 10/22/2017  . Bartholin gland cyst   . Fluid excess   . History of diabetes mellitus 01/15/2021  . Hypertension   . Hypertension 01/15/2021  . 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 Menorrhagia   . OSA (obstructive sleep apnea) 01/15/2021  . SVD (spontaneous vaginal delivery)    x 6   Social History   Socioeconomic History  . Marital status: Single    Spouse name: Not on file  . Number of children: 6  . Years of education: Not on file  . Highest education level: Not on file  Occupational History  . Not on file  Tobacco Use  . Smoking status: Never Smoker  . Smokeless tobacco: Never Used  Vaping Use  . Vaping Use: Never used  Substance and Sexual Activity  . Alcohol use: Never  . Drug use: Never  . Sexual activity: Yes    Birth control/protection: Surgical  Other Topics Concern  . Not on file  Social History Narrative   ** Merged History Encounter **       Social Determinants of Health   Financial Resource Strain: Not on file  Food Insecurity: Not on file  Transportation Needs: Not on file  Physical Activity: Not on file  Stress: Not on file  Social Connections: Not on file   Family History  Problem Relation Age of Onset  . Stroke Father   . Heart disease Father   . Diabetes Son   . Diabetes Son   . Breast cancer Paternal Aunt   . Breast cancer Paternal Grandmother   . Cancer Neg Hx    Scheduled Meds: . aspirin EC  81 mg Oral Daily  . atorvastatin  80 mg Oral Daily  . dexamethasone (DECADRON) injection  4 mg Intravenous Q6H  . docusate sodium  100 mg Oral BID  .  senna-docusate  1 tablet Oral BID   Continuous Infusions: . fluconazole (DIFLUCAN) IV     PRN Meds:.ipratropium-albuterol, labetalol, LORazepam, morphine injection Medications Prior to Admission:  Prior to Admission medications   Medication Sig Start Date End Date Taking? Authorizing Provider  dexamethasone (DECADRON) 4 MG tablet Take 8 mg by mouth 2 (two) times daily. 01/23/21  Yes [provider]  oxyCODONE (OXY IR/ROXICODONE) 5 MG immediate release tablet Take 5 mg by mouth every  6 (six) hours as needed for severe pain. 02/06/21 02/20/21 Yes [provider]  sulfamethoxazole-trimethoprim (BACTRIM DS) 800-160 MG tablet Take 1 tablet by mouth 2 (two) times daily. Start date : 02/06/21 02/01/21  Yes [provider]  albuterol (PROVENTIL) (2.5 MG/3ML) 0.083% nebulizer solution Take 3 mLs (2.5 mg total) by nebulization every 6 (six) hours as needed for wheezing or shortness of breath. Patient not taking: No sig reported 10/24/17   Oswald Hillock, MD  aspirin EC 81 MG EC tablet Take 1 tablet (81 mg total) by mouth daily. Swallow whole. Patient not taking: No sig reported 01/17/21 04/17/21  Dessa Phi, DO  atorvastatin (LIPITOR) 80 MG tablet Take 1 tablet (80 mg total) by mouth daily. Patient not taking: No sig reported 01/17/21   Dessa Phi, DO  budesonide (PULMICORT) 0.25 MG/2ML nebulizer solution Take 2 mLs (0.25 mg total) by nebulization 2 (two) times daily. Patient not taking: No sig reported 10/24/17   Oswald Hillock, MD  hydrOXYzine (ATARAX/VISTARIL) 10 MG tablet Take 1 tablet (10 mg total) by mouth 3 (three) times daily as needed for anxiety. Patient not taking: No sig reported 12/29/19   Copland, Gay Filler, MD  metFORMIN (GLUCOPHAGE) 500 MG tablet Take 1 tablet (500 mg total) by mouth 2 (two) times daily with a meal. Patient not taking: No sig reported 08/05/19   Copland, Gay Filler, MD  phentermine 15 MG capsule Take 1 capsule (15 mg total) by mouth every  morning. Patient not taking: No sig reported 08/28/19   Copland, Gay Filler, MD  rizatriptan (MAXALT) 10 MG tablet Take 1 tablet (10 mg total) by mouth as needed for migraine. May repeat in 2 hours if needed Patient not taking: No sig reported 07/15/17   Copland, Gay Filler, MD   Allergies  Allergen Reactions  . Naproxen Nausea And Vomiting  . Penicillins Other (See Comments)    unknown   Review of Systems Unable to obtain  Physical Exam  General: Sleepy, does not follow commands or participate in conversation.  HEENT: No bruits, no goiter, no JVD Heart: Regular rate and rhythm. No murmur appreciated. Lungs: Fair air movement, Abdomen: nondistended, positive bowel sounds.  Ext: No significant edema Skin: Warm and dry   Vital Signs: BP (!) 190/92 (BP Location: Right Arm) Comment: 32m Labatelol given @ 2005  Pulse 98   Temp 98.4 F (36.9 C) (Oral)   Resp 20   Ht _0  (1.626 m)   Wt 92 kg   LMP 06/10/2015   SpO2 97%   BMI 34.81 kg/m  Pain Scale: Faces POSS *See Group Information*: S-Acceptable,Sleep, easy to arouse Pain Score: Asleep   SpO2: SpO2: 97 % O2 Device:SpO2: 97 % O2 Flow Rate: .   IO: Intake/output summary:   Intake/Output Summary (Last 24 hours) at 02/14/2021 1001 Last data filed at 02/14/2021 0631 Gross per 24 hour  Intake 100 ml  Output 450 ml  Net -350 ml    LBM: Last BM Date:  (uta) Baseline Weight: Weight: 92 kg Most recent weight: Weight: 92 kg     Palliative Assessment/Data:   Flowsheet Rows   Flowsheet Row Most Recent Value  Intake Tab   Referral Department Hospitalist  Unit at Time of Referral Med/Surg Unit  Palliative Care Primary Diagnosis Cancer  Date Notified 02/12/21  Palliative Care Type New Palliative care  Reason for referral Clarify Goals of Care  Date of Admission 02/11/21  Date first seen by Palliative Care 02/13/21  #  of days Palliative referral response time 1 Day(s)  # of days IP prior to Palliative referral 1   Clinical Assessment   Psychosocial & Spiritual Assessment   Palliative Care Outcomes       Time In: 1800 Time Out: 1915 Time Total: 75 Greater than 50%  of this time was spent counseling and coordinating care related to the above assessment and plan.  Signed by: Micheline Rough, MD   Please contact Palliative Medicine Team phone at [phone] for questions and concerns.  For individual provider: See Shea Evans

## 2021-02-14 NOTE — Progress Notes (Addendum)
Due to continued yellow mews, VSs are supposed to be done every 4 hours. Family is requesting [place] not to wake her up to do VSs while patient is sleeping especially at midnight.   Also, noted patient didn't like getting foley care tonight. She kept pulling away from me while doing foley care. I stressed the importance of foley care and the reason why we do foley care. It seems uncomfortable to her to be touched. Will continue to attempt foley care on patient each shift.

## 2021-02-15 ENCOUNTER — Other Ambulatory Visit (HOSPITAL_COMMUNITY): Payer: Self-pay

## 2021-02-15 DIAGNOSIS — C50919 Malignant neoplasm of unspecified site of unspecified female breast: Secondary | ICD-10-CM

## 2021-02-15 DIAGNOSIS — G9341 Metabolic encephalopathy: Secondary | ICD-10-CM

## 2021-02-15 MED ORDER — MORPHINE SULFATE (CONCENTRATE) 10 MG /0.5 ML PO SOLN: 10.0000 mg | ORAL | 0 refills | Status: AC | PRN

## 2021-02-15 MED ORDER — MORPHINE SULFATE (CONCENTRATE) 10 MG /0.5 ML PO SOLN
10.0000 mg | ORAL | 0 refills | Status: AC | PRN
  Filled 2021-02-15: qty 30, 7d supply, fill #0

## 2021-02-15 MED ORDER — ONDANSETRON HCL 4 MG PO TABS
4.0000 mg | ORAL_TABLET | Freq: Four times a day (QID) | ORAL | 0 refills | Status: AC | PRN
  Filled 2021-02-15: qty 30, 8d supply, fill #0

## 2021-02-15 MED ORDER — ALBUTEROL SULFATE (2.5 MG/3ML) 0.083% IN NEBU
INHALATION_SOLUTION | RESPIRATORY_TRACT | 12 refills | Status: AC
  Filled 2021-02-15: qty 90, 7d supply, fill #0

## 2021-02-15 MED ORDER — ONDANSETRON 4 MG PO TBDP: 4.0000 mg | ORAL_TABLET | Freq: Three times a day (TID) | ORAL | 0 refills | Status: DC | PRN

## 2021-02-15 MED ORDER — ALBUTEROL SULFATE (2.5 MG/3ML) 0.083% IN NEBU: 2.5000 mg | INHALATION_SOLUTION | Freq: Four times a day (QID) | RESPIRATORY_TRACT | 12 refills | Status: AC | PRN

## 2021-02-15 MED ORDER — GLYCOPYRROLATE 1 MG PO TABS
1.0000 mg | ORAL_TABLET | Freq: Three times a day (TID) | ORAL | 0 refills | Status: AC | PRN
  Filled 2021-02-15: qty 15, 5d supply, fill #0

## 2021-02-15 MED ORDER — GLYCOPYRROLATE 1 MG PO TABS: 1.0000 mg | ORAL_TABLET | Freq: Three times a day (TID) | ORAL | 0 refills | Status: AC | PRN

## 2021-02-15 MED ORDER — ONDANSETRON 4 MG PO TBDP
4.0000 mg | ORAL_TABLET | Freq: Three times a day (TID) | ORAL | 0 refills | Status: DC | PRN
  Filled 2021-02-15: qty 20, 7d supply, fill #0

## 2021-02-15 MED ORDER — LIP MEDEX EX OINT
TOPICAL_OINTMENT | Freq: Once | CUTANEOUS | Status: AC
  Filled 2021-02-15: qty 7

## 2021-02-15 MED ORDER — LORAZEPAM 0.5 MG PO TABS
0.5000 mg | ORAL_TABLET | ORAL | 0 refills | Status: AC | PRN
  Filled 2021-02-15: qty 20, 4d supply, fill #0

## 2021-02-15 MED ORDER — LORAZEPAM 0.5 MG PO TABS: 0.5000 mg | ORAL_TABLET | ORAL | 0 refills | Status: AC | PRN

## 2021-02-15 NOTE — Progress Notes (Signed)
Manufacturing engineer [facility])   DME discussed with daughter Jonnie Finner and significant other Don. DME had been ordered for delivery yesterday; per [employer] unable to reach family. Verified with [employer] that DME is on a truck and scheduled for delivery today and provided updated phone numbers for family. Address has been verified and is correct in the chart.  ACC information and contact numbers given to [facility], significant other.   Above information shared with Christianne Dolin Manager.  Please call with any questions or concerns.  Thank you for the opportunity to participate in this pt's care.  Domenic Moras, BSN, RN [facility] [phone] (24h on call)

## 2021-02-15 NOTE — Progress Notes (Signed)
Patient still declines and refuses all attempts to turn. Patient does offload slightly independently but remains mostly on right side.

## 2021-02-15 NOTE — Progress Notes (Signed)
Attempted CHG and Foley/peri care. Patient pushed staff hands away and said "No" repeatedly. Encouraged patient to turn off right side, patient said no. Attempted to gently roll patient using draw sheet, patient said no again louder. Patient's son at bedside stated she had been on her left side earlier in the night and she seems comfortable. Will cont to monitor and encourage movement.

## 2021-02-15 NOTE — Discharge Summary (Signed)
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 intake, generalized weakness and altered mental status.  She was admitted for AKI with azotemia, hypertensive urgency and failure to thrive.  CT head with slightly worsened vasogenic edema and cortical thickening in the left posterior parietal lobe concerning for leptomeningeal metastatic disease.  Discussed with patient's oncologist, Dr. Jeanell Sparrow at [facility].  Per Dr. Jeanell Sparrow, patient is no longer a candidate for cancer treatment, and recommended hospice.  Palliative medicine made to his patient and patient's family.  Plan is to discharge home with home hospice when able hospice is ready.   Recommend ongoing discussion with family about CODE STATUS.  See individual problem list below for more on hospital course.  Discharge Diagnoses:  Failure to thrive/dehydration/decreased p.o. intake-as evidenced by hemoconcentration, AKI and azotemia.Likely due to breast cancer with brain mets with vasogenic edema. -Care is now emphasis on patient's comfort.  Left breast cancer with possible mets to brain-s/pbilateral mastectomy, adjuvant chemotherapy and radiation therapy.  Seems to have poor prognosis likely less than 3 months as per her last oncology notes from [facility].  At that time, hospice was brought up to family.CT head with slightly worsening vasogenic edema -Had a return call  from patient's oncologist, Dr. Jeanell Sparrow at [facility] Dr. Jeanell Sparrow considered chemo when she saw patient on 02/09/2021. However, patient continued to decline with worsening brain edema and metastasis. Dr. Jeanell Sparrow says she is no longer a candidate for chemotherapy and recommended hospice. Dr. Jeanell Sparrow will get in touch with family. Dr. Jeanell Sparrow says she would be happy to be her primary attending if she goes home with home hospice.  -Emphasis on comfort   Acute encephalopathy likely due to metastatic breast cancer, dehydration and possibly azotemia.  AKI/azotemia: Likely due to poor p.o. intake and dehydration.  Patient has not been cooperative with lab draws  Hypertensive urgency: BP improved but difficult to gait the right reading as patient is restless  History of asthma: Stable. -As needed albuterol  Hyperglycemia/NIDDM-2: Hyperglycemia could be due to steroid  Aphasia-seems to have both expressive and receptive component-CTA head and neckduring last hospitalizationconcerning for subacute ischemic CVA involving the posterior left parietal lobewhich might contribute to receptive aphasia. -Emphasis on comfort  Constipation  Generalized weakness/debility: Lately full assist with walker for mobility.  Concern about the difficulty urinating: Most likely from opiates. -Indwelling Foley catheter placed.  Concern about pain due to breast expander-unfortunately, we have to manage this with pain medication.   Goal of care counseling/discussion:  02/12/21-had extensive discussion with patient's family (significant other, two sons and daughter) about goal of care and CODE STATUS at bedside.  Unfortunately, family has not reached consensus.  Significant other and daughter seems to understand about poor prognosis.  However, her sons likes to "fight" and continue full scope of care.   02/13/21-I had a return call from patient's oncologist, Dr. Jeanell Sparrow at [facility]. Dr. Jeanell Sparrow considered chemo when she saw patient on 02/09/2021.  However, patient continued to decline with worsening brain edema and metastasis.  Dr. Jeanell Sparrow says she is no longer a candidate for chemotherapy and recommended hospice. Dr. Jeanell Sparrow will get in touch with family. Dr. Jeanell Sparrow says she would be happy to be her primary attending if she goes home with home hospice.   02/15/2021-emphasis on comfort until discharged home with home hospice once DME delivered..  Patient is a still full code.    Recommend ongoing discussion about CODE STATUS.   Class I obesity Body mass index is 34.81 kg/m.            Discharge Exam: Vitals:   02/14/21 1515 02/14/21 2110  BP: (!) 160/120 (!) 149/116  Pulse: (!) 115 73  Resp: 16 16  Temp:  97.8 F (36.6 C)  SpO2: 97% 99%    GENERAL: Resting comfortably.  No apparent distress. NECK: Supple.  No apparent JVD.  RESP: On RA.  No IWOB.  MSK/EXT:  Moves extremities. No apparent deformity. No edema.  SKIN: no apparent skin lesion or wound NEURO: Sleepy but wakes to voice.  No apparent focal neuro deficit but limited exam PSYCH: Calm.  No distress or agitation.  Discharge Instructions  Discharge Instructions    Diet general   Complete by: As directed      Allergies as of 02/15/2021      Reactions   Naproxen Nausea And Vomiting   Penicillins Other (See Comments)   unknown      Medication List    STOP taking these medications   aspirin 81 MG EC tablet   atorvastatin 80 MG tablet Commonly known as: LIPITOR   budesonide 0.25 MG/2ML nebulizer solution Commonly known as: PULMICORT   hydrOXYzine 10 MG tablet Commonly known as: ATARAX/VISTARIL   metFORMIN 500 MG tablet Commonly known as: GLUCOPHAGE   oxyCODONE 5 MG immediate release tablet Commonly known as: Oxy IR/ROXICODONE   phentermine 15 MG capsule   rizatriptan 10 MG tablet Commonly known as: MAXALT   sulfamethoxazole-trimethoprim 800-160 MG tablet Commonly known as: BACTRIM DS     TAKE these medications   albuterol (2.5 MG/3ML) 0.083%  nebulizer solution Commonly known as: PROVENTIL Take 3 mLs (2.5 mg total) by nebulization every 6 (six) hours as needed for wheezing or shortness of breath.   dexamethasone 4 MG tablet Commonly known as: DECADRON Take 8 mg by mouth 2 (two) times daily.   glycopyrrolate 1 MG tablet Commonly known as: ROBINUL Take 1 tablet (1 mg total) by mouth 3 (three) times daily as needed (Oropharyngeal secretions).   LORazepam 0.5 MG tablet Commonly known as: Ativan Take 1 tablet (0.5 mg total) by mouth every 4 (four) hours as needed for up to 20 doses for anxiety.   morphine CONCENTRATE 10 mg / 0.5 ml concentrated solution Take 0.5 mLs (10 mg total) by mouth every 3 (three) hours as needed for moderate pain, severe pain or shortness of breath.   ondansetron 4 MG disintegrating tablet Commonly known as: Zofran ODT Take 1 tablet (4 mg total) by mouth every 8 (eight) hours as needed for nausea or vomiting.       Consultations:  Patient's oncologist at [place]  Procedures/Studies:   CT HEAD WO CONTRAST  Result Date: 02/11/2021 CLINICAL DATA:  Altered mental status.  History of breast cancer. EXAM: CT HEAD WITHOUT CONTRAST TECHNIQUE: Contiguous axial images were obtained from the base of the skull through the vertex without intravenous contrast. COMPARISON:  CT head dated January 16, 2021. FINDINGS: Brain: Slightly worsened vasogenic edema and cortical thickening in the  left posterior parietal lobe. No evidence of acute infarction, hemorrhage, hydrocephalus, or extra-axial collection. Vascular: No hyperdense vessel or unexpected calcification. Skull: Similar small indeterminate subcentimeter lucencies in the medial right frontal bone and left parietal bone. No fracture. Sinuses/Orbits: No acute finding. Other: None. IMPRESSION: 1. Slightly worsened vasogenic edema and cortical thickening in the left posterior parietal lobe, which remains concerning for leptomeningeal metastatic  disease. 2. Similar small indeterminate subcentimeter lucencies in the skull. Consider bone scan for further evaluation. Electronically Signed   By: Titus Dubin M.D.   On: 02/11/2021 16:07   DG Chest Port 1 View  Result Date: 02/11/2021 CLINICAL DATA:  Weakness, dehydration and altered mental status. EXAM: PORTABLE CHEST 1 VIEW COMPARISON:  02/14/2018. FINDINGS: Normal sized heart. Poor inspiration. Interval mild elevation of the left hemidiaphragm. Clear lungs with normal vascularity. Thoracic spine degenerative changes. IMPRESSION: Interval poor inspiration and mild elevation of the left hemidiaphragm. Electronically Signed   By: Claudie Revering M.D.   On: 02/11/2021 15:30   VAS [place] TRANSCRANIAL DOPPLER W BUBBLES  Result Date: 01/17/2021  Transcranial Doppler with Bubble Indications: Stroke. Comparison Study: No prior studies. Performing Technologist: Darlin Coco RDMS,RVT  Examination Guidelines: A complete evaluation includes B-mode imaging, spectral Doppler, color Doppler, and power Doppler as needed of all accessible portions of each vessel. Bilateral testing is considered an integral part of a complete examination. Limited examinations for reoccurring indications may be performed as noted.  Summary:  A vascular evaluation was performed. The right middle cerebral artery was studied. An IV was inserted into the patient's right forearm. Verbal informed consent was obtained.  10-15 HITS (high intensity transient signals) were observed with valsalva, indicating a Spencer grade 2 PFO (patent foramen ovale). Positive TCd Bubble study indicative of a small right to left shunt *See table(s) above for TCD measurements and observations.  Diagnosing physician: Antony Contras MD Electronically signed by Antony Contras MD on 01/17/2021 at 12:51:36 PM.    Final    VAS [place] LOWER EXTREMITY VENOUS (DVT)  Result Date: 01/17/2021  Lower Venous DVT Study Indications: Stroke.  Risk Factors: Cancer -malignant neoplasm of left  breast. Comparison Study: No prior studies. Performing Technologist: Darlin Coco RDMS,RVT  Examination Guidelines: A complete evaluation includes B-mode imaging, spectral Doppler, color Doppler, and power Doppler as needed of all accessible portions of each vessel. Bilateral testing is considered an integral part of a complete examination. Limited examinations for reoccurring indications may be performed as noted. The reflux portion of the exam is performed with the patient in reverse Trendelenburg.  +---------+---------------+---------+-----------+----------+--------------+ RIGHT    CompressibilityPhasicitySpontaneityPropertiesThrombus Aging +---------+---------------+---------+-----------+----------+--------------+ CFV      Full           Yes      Yes                                 +---------+---------------+---------+-----------+----------+--------------+ SFJ      Full                                                        +---------+---------------+---------+-----------+----------+--------------+ FV Prox  Full                                                        +---------+---------------+---------+-----------+----------+--------------+  FV Mid   Full                                                        +---------+---------------+---------+-----------+----------+--------------+ FV DistalFull                                                        +---------+---------------+---------+-----------+----------+--------------+ PFV      Full                                                        +---------+---------------+---------+-----------+----------+--------------+ POP      Full           Yes      Yes                                 +---------+---------------+---------+-----------+----------+--------------+ PTV      Full                                                         +---------+---------------+---------+-----------+----------+--------------+ PERO     Full                                                        +---------+---------------+---------+-----------+----------+--------------+   +---------+---------------+---------+-----------+----------+--------------+ LEFT     CompressibilityPhasicitySpontaneityPropertiesThrombus Aging +---------+---------------+---------+-----------+----------+--------------+ CFV      Full           Yes      Yes                                 +---------+---------------+---------+-----------+----------+--------------+ SFJ      Full                                                        +---------+---------------+---------+-----------+----------+--------------+ FV Prox  Full                                                        +---------+---------------+---------+-----------+----------+--------------+ FV Mid   Full                                                        +---------+---------------+---------+-----------+----------+--------------+  FV DistalFull                                                        +---------+---------------+---------+-----------+----------+--------------+ PFV      Full                                                        +---------+---------------+---------+-----------+----------+--------------+ POP      Full           Yes      Yes                                 +---------+---------------+---------+-----------+----------+--------------+ PTV      Full                                                        +---------+---------------+---------+-----------+----------+--------------+ PERO     Full                                                        +---------+---------------+---------+-----------+----------+--------------+     Summary: RIGHT: - There is no evidence of deep vein thrombosis in the lower extremity.  - No cystic structure found in  the popliteal fossa.  LEFT: - There is no evidence of deep vein thrombosis in the lower extremity.  - No cystic structure found in the popliteal fossa.  *See table(s) above for measurements and observations. Electronically signed by Deitra Mayo MD on 01/17/2021 at 12:37:30 PM.    Final         The results of significant diagnostics from this hospitalization (including imaging, microbiology, ancillary and laboratory) are listed below for reference.     Microbiology: Recent Results (from the past 240 hour(s))  Urine Culture     Status: Abnormal   Collection Time: 02/11/21  7:38 PM   Specimen: Urine, Clean Catch  Result Value Ref Range Status   Specimen Description   Final    URINE, CLEAN CATCH Performed at [facility], [place] [address], [place], [place] [address]    Special Requests   Final    NONE Performed at [facility], [place] [address] [place], [place] [address]    Culture (A)  Final    10,000 COLONIES/mL MULTIPLE SPECIES PRESENT, SUGGEST RECOLLECTION   Report Status 02/13/2021 FINAL  Final  SARS CORONAVIRUS 2 (TAT 6-24 HRS) Nasopharyngeal Nasopharyngeal Swab     Status: None   Collection Time: 02/11/21  9:14 PM   Specimen: Nasopharyngeal Swab  Result Value Ref Range Status   SARS Coronavirus 2 NEGATIVE NEGATIVE Final    Comment: (NOTE) SARS-CoV-2 target nucleic acids are NOT DETECTED.  The SARS-CoV-2 RNA is generally detectable in upper and lower respiratory specimens during the acute phase of infection. Negative  results do not preclude SARS-CoV-2 infection, do not rule out co-infections with other pathogens, and should not be used as the sole basis for treatment or other patient management decisions. Negative results must be combined with clinical observations, patient history, and epidemiological information. The expected result is Negative.  Fact Sheet for Patients: SugarRoll.be  Fact Sheet  for Healthcare Providers: https://www.woods-mathews.com/  This test is not yet approved or cleared by the [place] FDA and  has been authorized for detection and/or diagnosis of SARS-CoV-2 by FDA under an Emergency Use Authorization (EUA). This EUA will remain  in effect (meaning this test can be used) for the duration of the COVID-19 declaration under Se ction 564(b)(1) of the Act, 21 U.S.C. section 360bbb-3(b)(1), unless the authorization is terminated or revoked sooner.  Performed at [facility], [place] [address], [place], [place] [address]      Labs:  CBC: Recent Labs  Lab 02/11/21 1437 02/12/21 1044  WBC 8.5 8.1  NEUTROABS 7.9*  --   HGB 14.4 13.8  HCT 44.5 43.3  MCV 86.6 86.6  PLT 194 155   BMP &GFR Recent Labs  Lab 02/11/21 1437 02/12/21 1044  NA 138 139  K 5.1 5.2*  CL 103 105  CO2 23 25  GLUCOSE 220* 153*  BUN 69* 49*  CREATININE 1.63* 1.19*  CALCIUM 9.5 9.7  MG  --  2.4   Estimated Creatinine Clearance: 57.3 mL/min (A) (by C-G formula based on SCr of 1.19 mg/dL (H)). Liver & Pancreas: Recent Labs  Lab 02/11/21 1437 02/12/21 1044  AST 59* 65*  ALT 56* 67*  ALKPHOS 151* 146*  BILITOT 1.0 1.2  PROT 8.0 7.6  ALBUMIN 4.1 3.9   No results for input(s): LIPASE, AMYLASE in the last 168 hours. No results for input(s): AMMONIA in the last 168 hours. Diabetic: No results for input(s): HGBA1C in the last 72 hours. Recent Labs  Lab 02/13/21 0756 02/13/21 1724 02/13/21 1942  GLUCAP 180* 159* 161*   Cardiac Enzymes: No results for input(s): CKTOTAL, CKMB, CKMBINDEX, TROPONINI in the last 168 hours. No results for input(s): PROBNP in the last 8760 hours. Coagulation Profile: Recent Labs  Lab 02/12/21 1044  INR 1.2   Thyroid Function Tests: Recent Labs    02/12/21 1044  TSH 1.929   Lipid Profile: No results for input(s): CHOL, HDL, LDLCALC, TRIG, CHOLHDL, LDLDIRECT in the last 72 hours. Anemia Panel: Recent Labs     02/12/21 1044  VITAMINB12 990*   Urine analysis:    Component Value Date/Time   COLORURINE YELLOW 02/11/2021 1938   APPEARANCEUR CLEAR 02/11/2021 1938   LABSPEC 1.019 02/11/2021 1938   PHURINE 5.0 02/11/2021 1938   GLUCOSEU NEGATIVE 02/11/2021 1938   HGBUR MODERATE (A) 02/11/2021 1938   BILIRUBINUR NEGATIVE 02/11/2021 1938   KETONESUR NEGATIVE 02/11/2021 1938   PROTEINUR NEGATIVE 02/11/2021 1938   UROBILINOGEN 1.0 06/17/2015 1940   NITRITE NEGATIVE 02/11/2021 1938   LEUKOCYTESUR NEGATIVE 02/11/2021 1938   Sepsis Labs: Invalid input(s): PROCALCITONIN, LACTICIDVEN   Time coordinating discharge: 40 minutes  SIGNED:  Mercy Riding, MD  [facility] 02/15/2021, 8:02 AM  If 7PM-7AM, please contact night-coverage [email or web address]

## 2021-02-15 NOTE — Progress Notes (Signed)
Daily Progress Note   Patient Name: Lori Green       Date: 02/15/2021 DOB: 17-Sep-1966  Age: 55 y.o. MRN#: [id number] Attending Physician: Mercy Riding, MD Primary Care Physician: Darreld Mclean, MD Admit Date: 02/11/2021  Reason for Consultation/Follow-up: Establishing goals of care  Subjective: Patient is resting in bed, lying in prone position.  A family member is present at bedside holding vigil, denies any concerns.     Length of Stay: 3  Current Medications: Scheduled Meds:  . aspirin EC  81 mg Oral Daily  . atorvastatin  80 mg Oral Daily  . Chlorhexidine Gluconate Cloth  6 each Topical Daily  . dexamethasone (DECADRON) injection  4 mg Intravenous Q6H  . docusate sodium  100 mg Oral BID  . senna-docusate  1 tablet Oral BID    Continuous Infusions: . fluconazole (DIFLUCAN) IV Stopped (02/14/21 2103)    PRN Meds: ipratropium-albuterol, labetalol, LORazepam, morphine injection  Physical Exam         Patient lying in prone position does not verbalize does not engage or wish to interact Noted to have regular work of breathing Noted to not have edema Noted to have muscle wasting  Vital Signs: BP (!) 149/116   Pulse 73   Temp 97.8 F (36.6 C) (Oral)   Resp 16   Ht 5\' 4"  (1.626 m)   Wt 92 kg   LMP 06/10/2015   SpO2 99%   BMI 34.81 kg/m  SpO2: SpO2: 99 % O2 Device: O2 Device: Room Air O2 Flow Rate:    Intake/output summary:   Intake/Output Summary (Last 24 hours) at 02/15/2021 1102 Last data filed at 02/15/2021 0500 Gross per 24 hour  Intake 100 ml  Output 300 ml  Net -200 ml   LBM: Last BM Date:  (uta) Baseline Weight: Weight: 92 kg Most recent weight: Weight: 92 kg       Palliative Assessment/Data: 30%   Flowsheet Rows   Flowsheet Row Most  Recent Value  Intake Tab   Referral Department Hospitalist  Unit at Time of Referral Med/Surg Unit  Palliative Care Primary Diagnosis Cancer  Date Notified 02/12/21  Palliative Care Type New Palliative care  Reason for referral Clarify Goals of Care  Date of Admission 02/11/21  Date first seen by Palliative Care 02/13/21  #  of days Palliative referral response time 1 Day(s)  # of days IP prior to Palliative referral 1  Clinical Assessment   Psychosocial & Spiritual Assessment   Palliative Care Outcomes       Patient Active Problem List   Diagnosis Date Noted  . Acute encephalopathy 02/11/2021  . DM (diabetes mellitus) ([place]) 01/16/2021  . HLD (hyperlipidemia) 01/16/2021  . History of left breast cancer 01/15/2021  . Migraine 01/15/2021  . History of diabetes mellitus 01/15/2021  . Hypertension 01/15/2021  . OSA (obstructive sleep apnea) 01/15/2021  . 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 Malignant neoplasm of left female breast ([place])   . Acute non intractable tension-type headache 01/14/2021  . Expressive aphasia 01/14/2021  . Neurological deficit present 01/14/2021  . Dizziness 01/14/2021  . Controlled type 2 diabetes mellitus without complication, without long-term current use of insulin ([place]) 08/07/2019  . Asthma exacerbation 10/22/2017  . Migraine without aura and without status migrainosus, not intractable 04/29/2017  . Hemorrhagic ovarian cyst 07/19/2015  . Postoperative state 07/19/2015  . Hypertension 09/23/2012  . Obesity 09/23/2012  . Dysfunctional uterine bleeding 09/12/2012    Palliative Care Assessment & Plan   Patient Profile:    Assessment: 55 year old lady with residual left breast cancer, status post bilateral mastectomy and chemotherapy, leptomeningeal disease Asthma, hypertension, obstructive sleep apnea Admitted with acute kidney injury azotemia hypertensive urgency failure to thrive CT scan showing worsening vasogenic edema and concern for  leptomeningeal metastatic disease  Recommendations/Plan:  Home with hospice once arrangements complete.    Code Status:    Code Status Orders  (From admission, onward)         Start     Ordered   02/11/21 1742  Full code  Continuous        02/11/21 1743        Code Status History    Date Active Date Inactive Code Status Order ID Comments User Context   01/15/2021 0346 01/16/2021 2258 Full Code [id number]  Vianne Bulls, MD Inpatient   10/23/2017 0115 10/24/2017 1546 Full Code [id number]  Rise Patience, MD Inpatient   07/19/2015 1137 07/20/2015 1806 Full Code [id number]  Lavonia Drafts, MD Inpatient   Advance Care Planning Activity       Prognosis:   < 4 weeks  Discharge Planning:  Home with Hospice  Care plan was discussed with IDT.   Thank you for allowing the Palliative Medicine Team to assist in the care of this patient.   Time In:  9 Time Out: 9.15 Total Time 15 Prolonged Time Billed  no       Greater than 50%  of this time was spent counseling and coordinating care related to the above assessment and plan.  Loistine Chance, MD  Please contact Palliative Medicine Team phone at [phone] for questions and concerns.

## 2021-02-15 NOTE — Progress Notes (Signed)
Attempted to do VSs on patient this am with NT while patient's son was in room. Patient was refusing and didn't want to be touched. Son asked if it was okay for [place] not to get VSs on her this morning. I stated that he could definitely refuse getting the VSs for her.

## 2021-02-15 NOTE — TOC Transition Note (Signed)
Transition of Care [facility]) - CM/SW Discharge Note   Patient Details  Name: Lori Green MRN: [id number] Date of Birth: 29-Sep-1966  [facility] CM/SW Contact:  Ross Ludwig, LCSW Phone Number: 02/15/2021, 5:41 PM   Clinical Narrative:     CSW was informed that patient's family were just waiting for hospital bed to be delivered.  CSW contacted [facility], and bed is being delivered around [employer], requested that patient's medication be delivered to patient before she discharges.  CSW contacted [facility], and they requested that medication prescriptions be faxed over due to physician having difficulty escribing due to technical problems.  CSW faxed requested medications to pharmacy.  Pharmacy confirmed the meds have been received and the pharmacy manager can deliver to patient's room.  CSW contacted [facility] and they will transport patient back to her home.  CSW confirmed with [facility] that patient will be seen in the morning, unless patient gets home early enough tonight.  Per [facility] there are seven patients ahead of her.  CSW updated [facility] and bedside nurse.  Family still would like patient to discharge home today.  CSW signing off.   Final next level of care: Home w Hospice Care Barriers to Discharge: Equipment Delay   Patient Goals and CMS Choice Patient states their goals for this hospitalization and ongoing recovery are:: To return back home with hospice services. CMS Medicare.gov Compare Post Acute Care list provided to:: Patient Represenative (must comment) Choice offered to / list presented to : Spouse  Discharge Placement                       Discharge Plan and Services   Discharge Planning Services: CM Consult Post Acute Care Choice: Hospice          DME Arranged: Hospital bed DME Agency: [facility] Date DME Agency Contacted: 02/15/21 Time DME Agency Contacted:  1100 Representative spoke with at DME Agency: [place] Determinants of Health ([place]) Interventions     Readmission Risk Interventions No flowsheet data found.

## 2021-02-15 NOTE — Progress Notes (Signed)
Patient discharged to home via [facility]. Son at bedside during transfer. All belongings w/ patient's son including prescriptions delivered by pharmacist. Reviewed proper dosage and delivery. Ativan administered prior to d/c for comfort during travel.

## 2021-02-16 ENCOUNTER — Other Ambulatory Visit (HOSPITAL_COMMUNITY): Payer: Self-pay

## 2021-03-05 DEATH — deceased

## 2021-03-16 ENCOUNTER — Other Ambulatory Visit (HOSPITAL_COMMUNITY): Payer: Self-pay

## 2022-03-11 IMAGING — US US BREAST BX W LOC DEV 1ST LESION IMG BX SPEC US GUIDE*L*
1 series · 12 of 19 positions shown · non-contrast
Comparison: Previous exam(s).
COMPARISON: Previous exam(s).

Addendum:
CLINICAL DATA: 54-year-old female presenting for a left breast mass
and left axillary lymph node.

EXAM:
ULTRASOUND GUIDED LEFT BREAST CORE NEEDLE BIOPSY
US AXILLARY NODE CORE BIOPSY LEFT

[Series 1: us breast bx w loc dev 1st lesion img bx spec us g · 0.07mm/px · 12 of 19 slices shown]
[im 1/19]
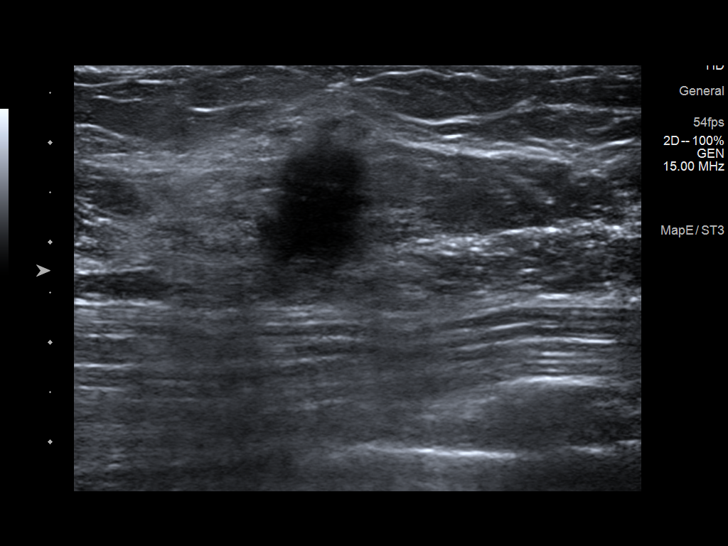
[im 3/19]
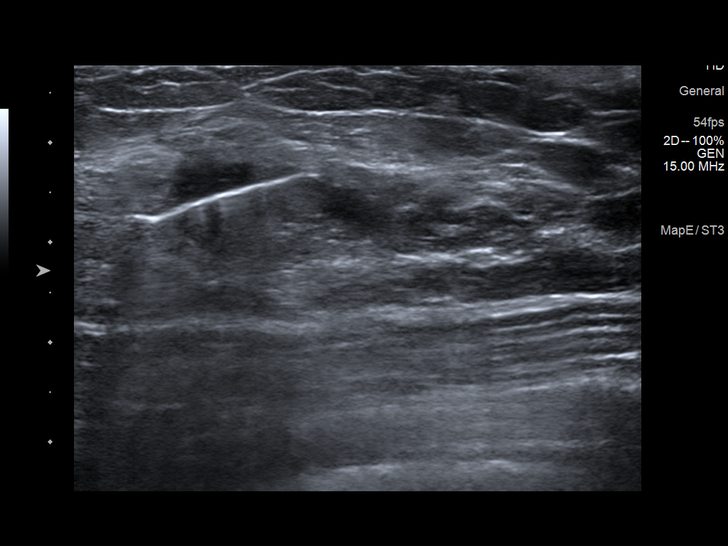
[im 4/19]
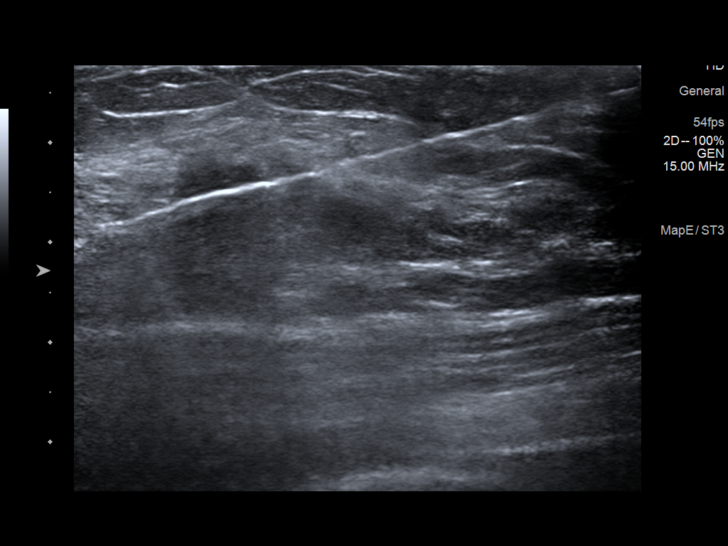
[im 6/19]
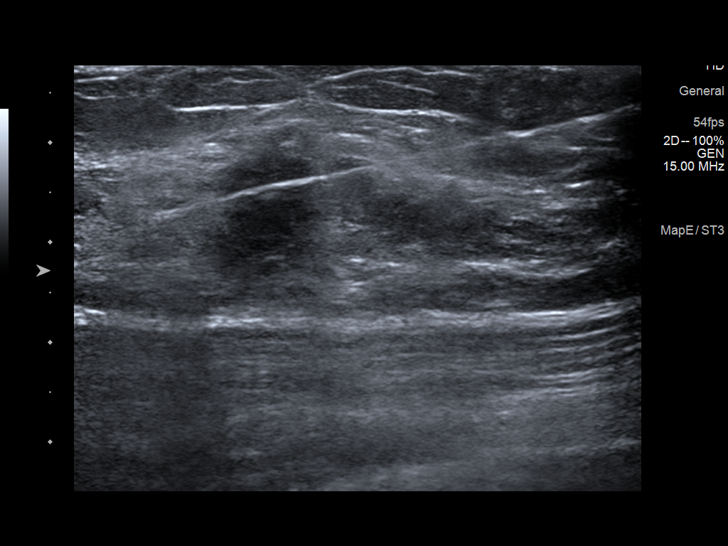
[im 8/19]
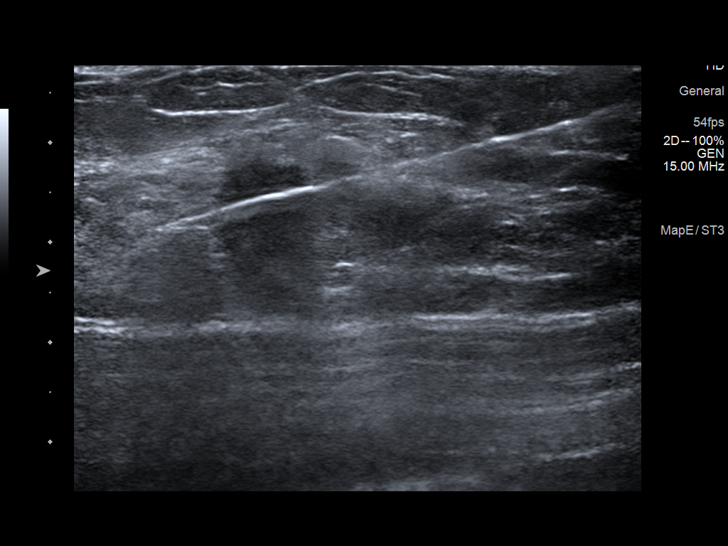
[im 9/19]
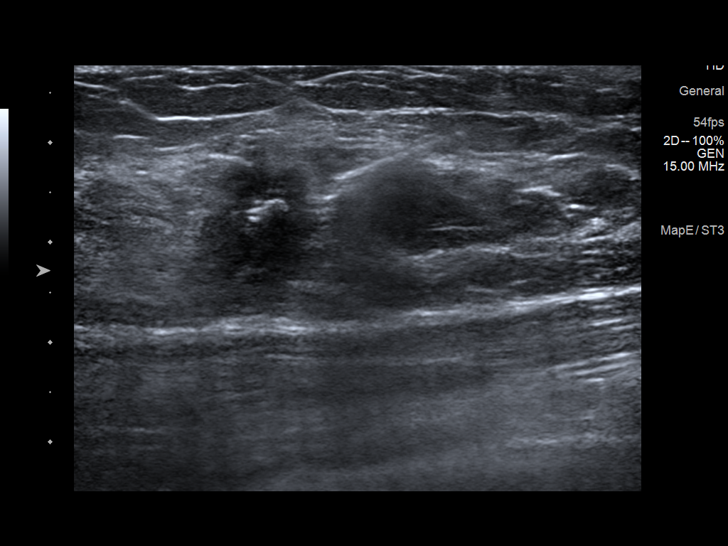
[im 11/19]
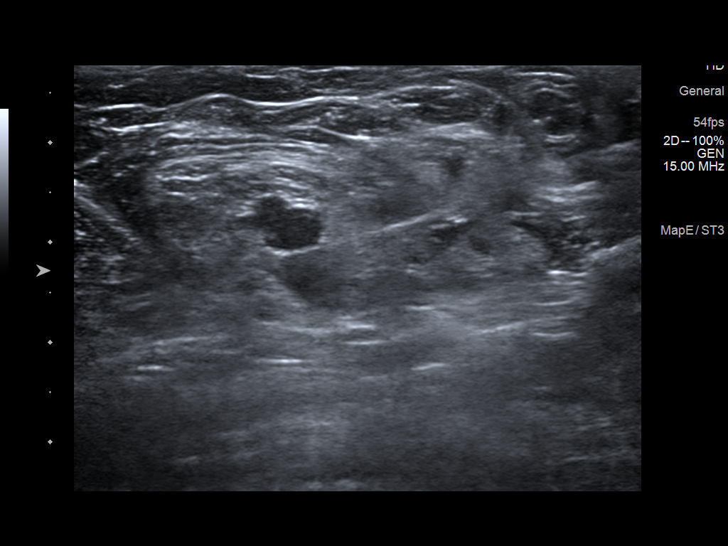
[im 12/19]
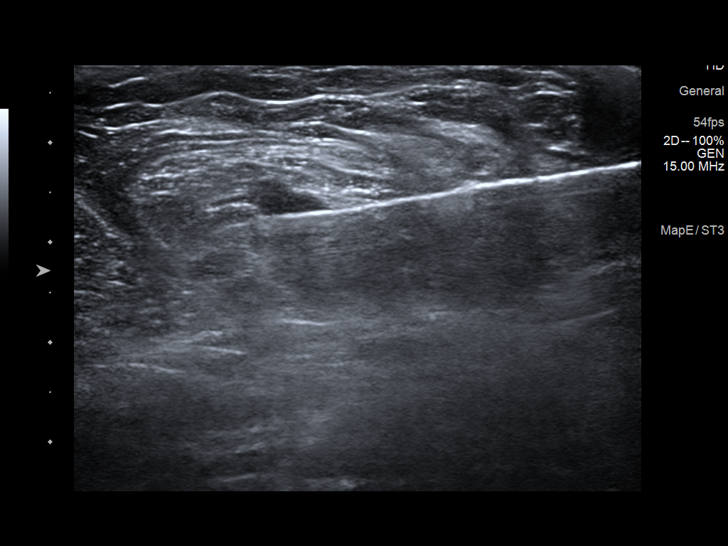
[im 14/19]
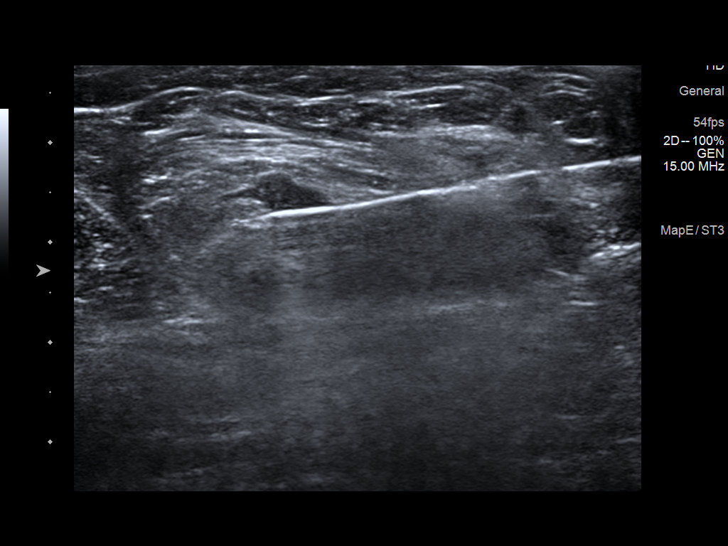
[im 16/19]
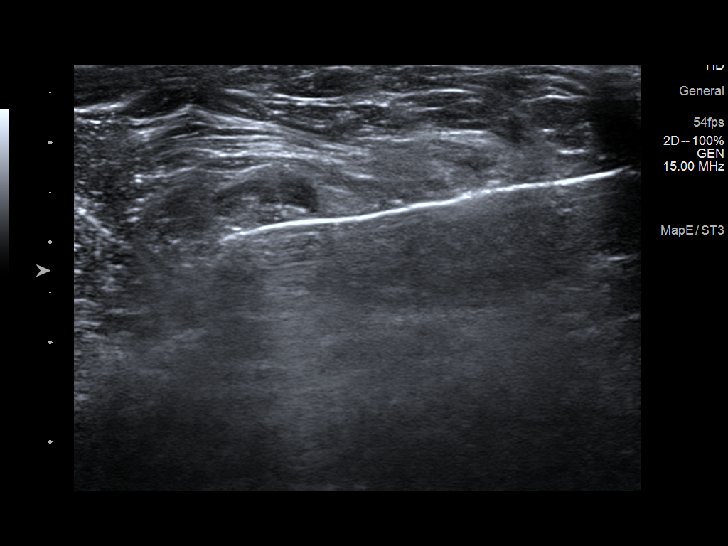
[im 17/19]
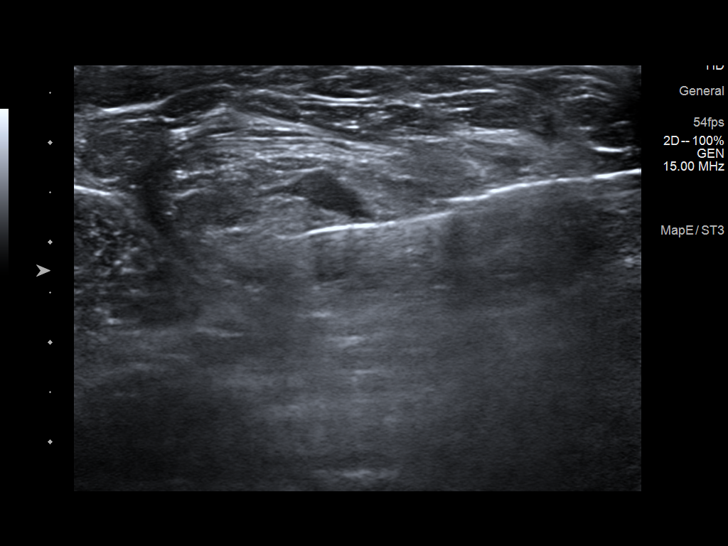
[im 19/19]
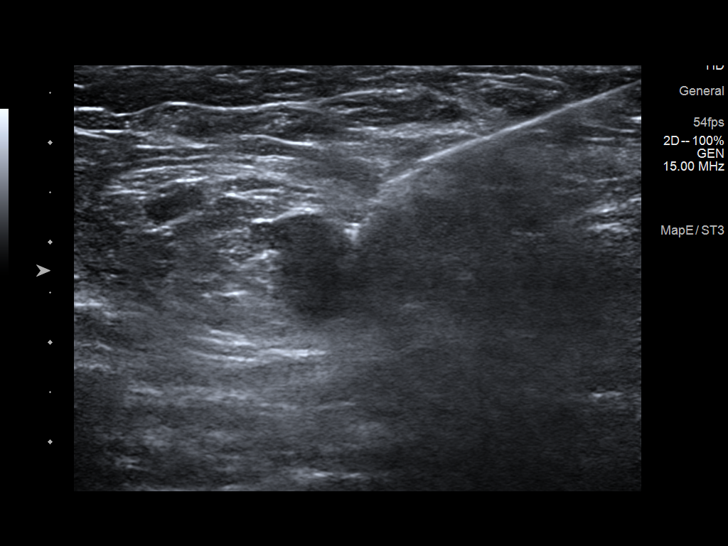

[12 of 19 positions shown; findings below may reference images not displayed]



1.  Lesion quadrant: Upper inner quadrant

Using sterile technique and 1% Lidocaine as local anesthetic, under
direct ultrasound visualization, a 14 gauge Jzsf device was
used to perform biopsy of a mass in the left breast at 10 o'clock
using a inferior approach. At the conclusion of the procedure a
ribbon tissue marker clip was deployed into the biopsy cavity.
Follow up 2 view mammogram was performed and dictated separately.

2.  Axilla

Using sterile technique and 1% Lidocaine as local anesthetic, under
direct ultrasound visualization, a 14 gauge Jzsf device was
used to perform biopsy of a left axillary lymph node using a lateral
approach. At the conclusion of the procedure a HydroMARK tissue
marker clip was deployed into the biopsy cavity. Follow up 2 view
mammogram was performed and dictated separately.
IMPRESSION: Ultrasound guided biopsy of a left breast mass at 10 o'clock and of
a left axillary lymph node. No apparent complications.

ADDENDUM:
Pathology revealed GRADE III INVASIVE DUCTAL CARCINOMA of the LEFT
breast, 10 o'clock. This was found to be concordant by Dr. Tiger
Lupito.

Pathology revealed METASTATIC CARCINOMA IN 1 OF 1 LYMPH NODE ([DATE])
of the LEFT axilla. This was found to be concordant by Dr. Tiger
Lupito.

Dr. Isehaq Jusim discussed pathology results with the patient in
person at The [REDACTED]. The patient reported doing well after
the biopsies with tenderness at the sites. Post biopsy instructions
and care were reviewed and questions were answered. The patient was
encouraged to call The [REDACTED] for any
additional concerns.

Recommendation is surgical referral and contrast enhanced breast MRI
given multiplicity of circumscribed masses bilaterally, per previous
diagnostic report. The patient has requested a surgical referral to
[HOSPITAL] [facility] and referral to Dr. [REDACTED]
Oncologist. Results and patient requests given to Dalisu Shole CMA with
Dr. Marsetic of [REDACTED] [REDACTED].

Patient is scheduled for ultrasound-guided biopsy of RIGHT breast
April 06, 2020.

Pathology results reported by Kerrycare Basela RN on 04/06/2020.



1.  Lesion quadrant: Upper inner quadrant

Using sterile technique and 1% Lidocaine as local anesthetic, under
direct ultrasound visualization, a 14 gauge Labeen Braancho device was
used to perform biopsy of a mass in the left breast at 10 o'clock
using a inferior approach. At the conclusion of the procedure a
ribbon tissue marker clip was deployed into the biopsy cavity.
Follow up 2 view mammogram was performed and dictated separately.

2.  Axilla

Using sterile technique and 1% Lidocaine as local anesthetic, under
direct ultrasound visualization, a 14 gauge Jzsf device was
used to perform biopsy of a left axillary lymph node using a lateral
approach. At the conclusion of the procedure a HydroMARK tissue
marker clip was deployed into the biopsy cavity. Follow up 2 view
mammogram was performed and dictated separately.
IMPRESSION: Ultrasound guided biopsy of a left breast mass at 10 o'clock and of
a left axillary lymph node. No apparent complications.
